# Patient Record
Sex: Female | Born: 1945
Health system: Southern US, Community
[De-identification: ages and names within clinical notes are randomized; demographics above are authoritative.]

## PROBLEM LIST (undated history)

## (undated) DIAGNOSIS — R7303 Prediabetes: Secondary | ICD-10-CM

## (undated) DIAGNOSIS — T8859XA Other complications of anesthesia, initial encounter: Secondary | ICD-10-CM

## (undated) DIAGNOSIS — I10 Essential (primary) hypertension: Secondary | ICD-10-CM

## (undated) DIAGNOSIS — T4145XA Adverse effect of unspecified anesthetic, initial encounter: Secondary | ICD-10-CM

## (undated) DIAGNOSIS — H33339 Multiple defects of retina without detachment, unspecified eye: Secondary | ICD-10-CM

## (undated) DIAGNOSIS — E785 Hyperlipidemia, unspecified: Secondary | ICD-10-CM

## (undated) DIAGNOSIS — H43819 Vitreous degeneration, unspecified eye: Secondary | ICD-10-CM

## (undated) DIAGNOSIS — K219 Gastro-esophageal reflux disease without esophagitis: Secondary | ICD-10-CM

## (undated) DIAGNOSIS — K589 Irritable bowel syndrome without diarrhea: Secondary | ICD-10-CM

## (undated) DIAGNOSIS — I872 Venous insufficiency (chronic) (peripheral): Secondary | ICD-10-CM

## (undated) DIAGNOSIS — H269 Unspecified cataract: Secondary | ICD-10-CM

## (undated) HISTORY — DX: Vitreous degeneration, unspecified eye: H43.819

## (undated) HISTORY — DX: Irritable bowel syndrome, unspecified: K58.9

## (undated) HISTORY — DX: Essential (primary) hypertension: I10

## (undated) HISTORY — DX: Unspecified cataract: H26.9

## (undated) HISTORY — DX: Hyperlipidemia, unspecified: E78.5

## (undated) HISTORY — DX: Multiple defects of retina without detachment, unspecified eye: H33.339

## (undated) HISTORY — DX: Morbid (severe) obesity due to excess calories: E66.01

## (undated) HISTORY — DX: Gastro-esophageal reflux disease without esophagitis: K21.9

## (undated) HISTORY — DX: Venous insufficiency (chronic) (peripheral): I87.2

## (undated) HISTORY — DX: Prediabetes: R73.03

---
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
## 1993-09-12 DIAGNOSIS — I10 Essential (primary) hypertension: Secondary | ICD-10-CM

## 1993-09-12 HISTORY — DX: Essential (primary) hypertension: I10

## 1998-09-12 HISTORY — PX: RETINAL TEAR REPAIR CRYOTHERAPY: SHX5304

## 2012-03-14 ENCOUNTER — Ambulatory Visit (HOSPITAL_COMMUNITY)
Admission: RE | Admit: 2012-03-14 | Discharge: 2012-03-14 | Disposition: A | Discharge status: Home / Self Care | Payer: BC Managed Care – PPO | Source: Ambulatory Visit | Attending: Internal Medicine | Admitting: Internal Medicine

## 2012-03-14 ENCOUNTER — Other Ambulatory Visit (HOSPITAL_COMMUNITY): Payer: Self-pay | Admitting: Internal Medicine

## 2012-03-14 DIAGNOSIS — I10 Essential (primary) hypertension: Secondary | ICD-10-CM

## 2012-03-14 DIAGNOSIS — Z0000 Encounter for general adult medical examination without abnormal findings: Secondary | ICD-10-CM | POA: Insufficient documentation

## 2014-09-12 DIAGNOSIS — R7303 Prediabetes: Secondary | ICD-10-CM

## 2014-09-12 HISTORY — DX: Prediabetes: R73.03

## 2015-04-07 ENCOUNTER — Ambulatory Visit (INDEPENDENT_AMBULATORY_CARE_PROVIDER_SITE_OTHER): Payer: Medicare PPO | Admitting: Internal Medicine

## 2015-04-07 ENCOUNTER — Encounter: Payer: Self-pay | Admitting: Internal Medicine

## 2015-04-07 VITALS — BP 148/82 | HR 68 | Temp 98.2°F | Resp 18 | Ht 67.75 in | Wt 269.0 lb

## 2015-04-07 DIAGNOSIS — Z1329 Encounter for screening for other suspected endocrine disorder: Secondary | ICD-10-CM

## 2015-04-07 DIAGNOSIS — R6889 Other general symptoms and signs: Secondary | ICD-10-CM

## 2015-04-07 DIAGNOSIS — E559 Vitamin D deficiency, unspecified: Secondary | ICD-10-CM

## 2015-04-07 DIAGNOSIS — Z0000 Encounter for general adult medical examination without abnormal findings: Secondary | ICD-10-CM

## 2015-04-07 DIAGNOSIS — Z130 Encounter for screening for diseases of the blood and blood-forming organs and certain disorders involving the immune mechanism: Secondary | ICD-10-CM

## 2015-04-07 DIAGNOSIS — I10 Essential (primary) hypertension: Secondary | ICD-10-CM

## 2015-04-07 DIAGNOSIS — Z79899 Other long term (current) drug therapy: Secondary | ICD-10-CM

## 2015-04-07 DIAGNOSIS — Z13220 Encounter for screening for lipoid disorders: Secondary | ICD-10-CM

## 2015-04-07 DIAGNOSIS — Z131 Encounter for screening for diabetes mellitus: Secondary | ICD-10-CM

## 2015-04-07 DIAGNOSIS — Z9181 History of falling: Secondary | ICD-10-CM

## 2015-04-07 DIAGNOSIS — Z1331 Encounter for screening for depression: Secondary | ICD-10-CM

## 2015-04-07 DIAGNOSIS — Z0001 Encounter for general adult medical examination with abnormal findings: Secondary | ICD-10-CM

## 2015-04-07 DIAGNOSIS — Z1389 Encounter for screening for other disorder: Secondary | ICD-10-CM

## 2015-04-07 DIAGNOSIS — Z136 Encounter for screening for cardiovascular disorders: Secondary | ICD-10-CM

## 2015-04-07 LAB — CBC WITH DIFFERENTIAL/PLATELET
BASOS ABS: 0 10*3/uL (ref 0.0–0.1)
Basophils Relative: 1 % (ref 0–1)
EOS PCT: 3 % (ref 0–5)
Eosinophils Absolute: 0.1 10*3/uL (ref 0.0–0.7)
HEMATOCRIT: 36.3 % (ref 36.0–46.0)
HEMOGLOBIN: 11.7 g/dL — AB (ref 12.0–15.0)
LYMPHS ABS: 1.8 10*3/uL (ref 0.7–4.0)
LYMPHS PCT: 42 % (ref 12–46)
MCH: 29.8 pg (ref 26.0–34.0)
MCHC: 32.2 g/dL (ref 30.0–36.0)
MCV: 92.4 fL (ref 78.0–100.0)
MONO ABS: 0.2 10*3/uL (ref 0.1–1.0)
MONOS PCT: 4 % (ref 3–12)
MPV: 9.7 fL (ref 8.6–12.4)
NEUTROS PCT: 50 % (ref 43–77)
Neutro Abs: 2.2 10*3/uL (ref 1.7–7.7)
Platelets: 263 10*3/uL (ref 150–400)
RBC: 3.93 MIL/uL (ref 3.87–5.11)
RDW: 14.1 % (ref 11.5–15.5)
WBC: 4.4 10*3/uL (ref 4.0–10.5)

## 2015-04-07 LAB — BASIC METABOLIC PANEL WITH GFR
BUN: 12 mg/dL (ref 7–25)
CHLORIDE: 105 meq/L (ref 98–110)
CO2: 27 mEq/L (ref 20–31)
Calcium: 9.1 mg/dL (ref 8.6–10.4)
Creat: 0.99 mg/dL (ref 0.50–0.99)
GFR, EST AFRICAN AMERICAN: 68 mL/min (ref >=60)
GFR, EST NON AFRICAN AMERICAN: 59 mL/min — AB (ref >=60)
GLUCOSE: 89 mg/dL (ref 65–99)
POTASSIUM: 4.2 meq/L (ref 3.5–5.3)
Sodium: 140 mEq/L (ref 135–146)

## 2015-04-07 LAB — IRON AND TIBC
%SAT: 24 % (ref 20–55)
Iron: 77 ug/dL (ref 42–145)
TIBC: 327 ug/dL (ref 250–470)
UIBC: 250 ug/dL (ref 125–400)

## 2015-04-07 LAB — HEMOGLOBIN A1C
Hgb A1c MFr Bld: 6 % — ABNORMAL HIGH (ref <5.7)
Mean Plasma Glucose: 126 mg/dL — ABNORMAL HIGH (ref <117)

## 2015-04-07 LAB — LIPID PANEL
CHOL/HDL RATIO: 5.2 ratio — AB (ref <=5.0)
CHOLESTEROL: 293 mg/dL — AB (ref 125–200)
HDL: 56 mg/dL (ref >=46)
LDL Cholesterol: 212 mg/dL — ABNORMAL HIGH (ref <130)
TRIGLYCERIDES: 124 mg/dL (ref <150)
VLDL: 25 mg/dL (ref <30)

## 2015-04-07 LAB — HEPATIC FUNCTION PANEL
ALK PHOS: 74 U/L (ref 33–130)
ALT: 8 U/L (ref 6–29)
AST: 16 U/L (ref 10–35)
Albumin: 3.9 g/dL (ref 3.6–5.1)
BILIRUBIN DIRECT: 0.1 mg/dL (ref <=0.2)
Indirect Bilirubin: 0.3 mg/dL (ref 0.2–1.2)
Total Bilirubin: 0.4 mg/dL (ref 0.2–1.2)
Total Protein: 7.5 g/dL (ref 6.1–8.1)

## 2015-04-07 LAB — VITAMIN B12: Vitamin B-12: 871 pg/mL (ref 211–911)

## 2015-04-07 LAB — TSH: TSH: 1.666 u[IU]/mL (ref 0.350–4.500)

## 2015-04-07 LAB — MAGNESIUM: MAGNESIUM: 2.3 mg/dL (ref 1.5–2.5)

## 2015-04-07 MED ORDER — AZITHROMYCIN 250 MG PO TABS: ORAL_TABLET | ORAL | Status: DC

## 2015-04-07 MED ORDER — CIPROFLOXACIN HCL 500 MG PO TABS: 500.0000 mg | ORAL_TABLET | Freq: Two times a day (BID) | ORAL | Status: AC

## 2015-04-07 MED ORDER — MONTELUKAST SODIUM 10 MG PO TABS: 10.0000 mg | ORAL_TABLET | Freq: Every day | ORAL | Status: DC

## 2015-04-07 MED ORDER — PREDNISONE 20 MG PO TABS: ORAL_TABLET | ORAL | Status: DC

## 2015-04-07 MED ORDER — MECLIZINE HCL 25 MG PO TABS: 25.0000 mg | ORAL_TABLET | Freq: Three times a day (TID) | ORAL | Status: DC | PRN

## 2015-04-07 NOTE — Patient Instructions (Addendum)
Preventive Care for Adults  A healthy lifestyle and preventive care can promote health and wellness. Preventive health guidelines for women include the following key practices.  A routine yearly physical is a good way to check with your health care provider about your health and preventive screening. It is a chance to share any concerns and updates on your health and to receive a thorough exam.  Visit your dentist for a routine exam and preventive care every 6 months. Brush your teeth twice a day and floss once a day. Good oral hygiene prevents tooth decay and gum disease.  The frequency of eye exams is based on your age, health, family medical history, use of contact lenses, and other factors. Follow your health care provider's recommendations for frequency of eye exams.  Eat a healthy diet. Foods like vegetables, fruits, whole grains, low-fat dairy products, and lean protein foods contain the nutrients you need without too many calories. Decrease your intake of foods high in solid fats, added sugars, and salt. Eat the right amount of calories for you.Get information about a proper diet from your health care provider, if necessary.  Regular physical exercise is one of the most important things you can do for your health. Most adults should get at least 150 minutes of moderate-intensity exercise (any activity that increases your heart rate and causes you to sweat) each week. In addition, most adults need muscle-strengthening exercises on 2 or more days a week.  Maintain a healthy weight. The body mass index (BMI) is a screening tool to identify possible weight problems. It provides an estimate of body fat based on height and weight. Your health care provider can find your BMI and can help you achieve or maintain a healthy weight.For adults 20 years and older:  A BMI below 18.5 is considered underweight.  A BMI of 18.5 to 24.9 is normal.  A BMI of 25 to 29.9 is considered overweight.  A BMI  of 30 and above is considered obese.  Maintain normal blood lipids and cholesterol levels by exercising and minimizing your intake of saturated fat. Eat a balanced diet with plenty of fruit and vegetables. If your lipid or cholesterol levels are high, you are over 50, or you are at high risk for heart disease, you may need your cholesterol levels checked more frequently.Ongoing high lipid and cholesterol levels should be treated with medicines if diet and exercise are not working.  If you smoke, find out from your health care provider how to quit. If you do not use tobacco, do not start.  Lung cancer screening is recommended for adults aged 2-80 years who are at high risk for developing lung cancer because of a history of smoking. A yearly low-dose CT scan of the lungs is recommended for people who have at least a 30-pack-year history of smoking and are a current smoker or have quit within the past 15 years. A pack year of smoking is smoking an average of 1 pack of cigarettes a day for 1 year (for example: 1 pack a day for 30 years or 2 packs a day for 15 years). Yearly screening should continue until the smoker has stopped smoking for at least 15 years. Yearly screening should be stopped for people who develop a health problem that would prevent them from having lung cancer treatment.  Avoid use of street drugs. Do not share needles with anyone. Ask for help if you need support or instructions about stopping the use of drugs.  High  blood pressure causes heart disease and increases the risk of stroke.  Ongoing high blood pressure should be treated with medicines if weight loss and exercise do not work.  If you are 55-79 years old, ask your health care provider if you should take aspirin to prevent strokes.  Diabetes screening involves taking a blood sample to check your fasting blood sugar level. This should be done once every 3 years, after age 45, if you are within normal weight and without risk  factors for diabetes. Testing should be considered at a younger age or be carried out more frequently if you are overweight and have at least 1 risk factor for diabetes.  Breast cancer screening is essential preventive care for women. You should practice "breast self-awareness." This means understanding the normal appearance and feel of your breasts and may include breast self-examination. Any changes detected, no matter how small, should be reported to a health care provider. Women in their 20s and 30s should have a clinical breast exam (CBE) by a health care provider as part of a regular health exam every 1 to 3 years. After age 40, women should have a CBE every year. Starting at age 40, women should consider having a mammogram (breast X-ray test) every year. Women who have a family history of breast cancer should talk to their health care provider about genetic screening. Women at a high risk of breast cancer should talk to their health care providers about having an MRI and a mammogram every year.  Breast cancer gene (BRCA)-related cancer risk assessment is recommended for women who have family members with BRCA-related cancers. BRCA-related cancers include breast, ovarian, tubal, and peritoneal cancers. Having family members with these cancers may be associated with an increased risk for harmful changes (mutations) in the breast cancer genes BRCA1 and BRCA2. Results of the assessment will determine the need for genetic counseling and BRCA1 and BRCA2 testing.  Routine pelvic exams to screen for cancer are no longer recommended for nonpregnant women who are considered low risk for cancer of the pelvic organs (ovaries, uterus, and vagina) and who do not have symptoms. Ask your health care provider if a screening pelvic exam is right for you.  If you have had past treatment for cervical cancer or a condition that could lead to cancer, you need Pap tests and screening for cancer for at least 20 years after  your treatment. If Pap tests have been discontinued, your risk factors (such as having a new sexual partner) need to be reassessed to determine if screening should be resumed. Some women have medical problems that increase the chance of getting cervical cancer. In these cases, your health care provider may recommend more frequent screening and Pap tests.    Colorectal cancer can be detected and often prevented. Most routine colorectal cancer screening begins at the age of 50 years and continues through age 75 years. However, your health care provider may recommend screening at an earlier age if you have risk factors for colon cancer. On a yearly basis, your health care provider may provide home test kits to check for hidden blood in the stool. Use of a small camera at the end of a tube, to directly examine the colon (sigmoidoscopy or colonoscopy), can detect the earliest forms of colorectal cancer. Talk to your health care provider about this at age 50, when routine screening begins. Direct exam of the colon should be repeated every 5-10 years through age 75 years, unless early forms of pre-cancerous   polyps or small growths are found.  Osteoporosis is a disease in which the bones lose minerals and strength with aging. This can result in serious bone fractures or breaks. The risk of osteoporosis can be identified using a bone density scan. Women ages 68 years and over and women at risk for fractures or osteoporosis should discuss screening with their health care providers. Ask your health care provider whether you should take a calcium supplement or vitamin D to reduce the rate of osteoporosis.  Menopause can be associated with physical symptoms and risks. Hormone replacement therapy is available to decrease symptoms and risks. You should talk to your health care provider about whether hormone replacement therapy is right for you.  Use sunscreen. Apply sunscreen liberally and repeatedly throughout the day.  You should seek shade when your shadow is shorter than you. Protect yourself by wearing long sleeves, pants, a wide-brimmed hat, and sunglasses year round, whenever you are outdoors.  Once a month, do a whole body skin exam, using a mirror to look at the skin on your back. Tell your health care provider of new moles, moles that have irregular borders, moles that are larger than a pencil eraser, or moles that have changed in shape or color.  Stay current with required vaccines (immunizations).  Influenza vaccine. All adults should be immunized every year.  Tetanus, diphtheria, and acellular pertussis (Td, Tdap) vaccine. Pregnant women should receive 1 dose of Tdap vaccine during each pregnancy. The dose should be obtained regardless of the length of time since the last dose. Immunization is preferred during the 27th-36th week of gestation. An adult who has not previously received Tdap or who does not know her vaccine status should receive 1 dose of Tdap. This initial dose should be followed by tetanus and diphtheria toxoids (Td) booster doses every 10 years. Adults with an unknown or incomplete history of completing a 3-dose immunization series with Td-containing vaccines should begin or complete a primary immunization series including a Tdap dose. Adults should receive a Td booster every 10 years.    Zoster vaccine. One dose is recommended for adults aged 36 years or older unless certain conditions are present.    Pneumococcal 13-valent conjugate (PCV13) vaccine. When indicated, a person who is uncertain of her immunization history and has no record of immunization should receive the PCV13 vaccine. An adult aged 1 years or older who has certain medical conditions and has not been previously immunized should receive 1 dose of PCV13 vaccine. This PCV13 should be followed with a dose of pneumococcal polysaccharide (PPSV23) vaccine. The PPSV23 vaccine dose should be obtained at least 8 weeks after the  dose of PCV13 vaccine. An adult aged 73 years or older who has certain medical conditions and previously received 1 or more doses of PPSV23 vaccine should receive 1 dose of PCV13. The PCV13 vaccine dose should be obtained 1 or more years after the last PPSV23 vaccine dose.    Pneumococcal polysaccharide (PPSV23) vaccine. When PCV13 is also indicated, PCV13 should be obtained first. All adults aged 47 years and older should be immunized. An adult younger than age 86 years who has certain medical conditions should be immunized. Any person who resides in a nursing home or long-term care facility should be immunized. An adult smoker should be immunized. People with an immunocompromised condition and certain other conditions should receive both PCV13 and PPSV23 vaccines. People with human immunodeficiency virus (HIV) infection should be immunized as soon as possible after diagnosis. Immunization  during chemotherapy or radiation therapy should be avoided. Routine use of PPSV23 vaccine is not recommended for American Indians, [place] Natives, or people younger than 65 years unless there are medical conditions that require PPSV23 vaccine. When indicated, people who have unknown immunization and have no record of immunization should receive PPSV23 vaccine. One-time revaccination 5 years after the first dose of PPSV23 is recommended for people aged 19-64 years who have chronic kidney failure, nephrotic syndrome, asplenia, or immunocompromised conditions. People who received 1-2 doses of PPSV23 before age 4 years should receive another dose of PPSV23 vaccine at age 77 years or later if at least 5 years have passed since the previous dose. Doses of PPSV23 are not needed for people immunized with PPSV23 at or after age 69 years.   Preventive Services / Frequency  Ages 56 years and over  Blood pressure check.  Lipid and cholesterol check.  Lung cancer screening. / Every year if you are aged 23-80 years and have a  30-pack-year history of smoking and currently smoke or have quit within the past 15 years. Yearly screening is stopped once you have quit smoking for at least 15 years or develop a health problem that would prevent you from having lung cancer treatment.  Clinical breast exam.** / Every year after age 1 years.  BRCA-related cancer risk assessment.** / For women who have family members with a BRCA-related cancer (breast, ovarian, tubal, or peritoneal cancers).  Mammogram.** / Every year beginning at age 51 years and continuing for as long as you are in good health. Consult with your health care provider.  Pap test.** / Every 3 years starting at age 23 years through age 49 or 45 years with 3 consecutive normal Pap tests. Testing can be stopped between 65 and 70 years with 3 consecutive normal Pap tests and no abnormal Pap or HPV tests in the past 10 years.  Fecal occult blood test (FOBT) of stool. / Every year beginning at age 33 years and continuing until age 63 years. You may not need to do this test if you get a colonoscopy every 10 years.  Flexible sigmoidoscopy or colonoscopy.** / Every 5 years for a flexible sigmoidoscopy or every 10 years for a colonoscopy beginning at age 60 years and continuing until age 62 years.  Hepatitis C blood test.** / For all people born from [birth year] through 1963 and any individual with known risks for hepatitis C.  Osteoporosis screening.** / A one-time screening for women ages 38 years and over and women at risk for fractures or osteoporosis.  Skin self-exam. / Monthly.  Influenza vaccine. / Every year.  Tetanus, diphtheria, and acellular pertussis (Tdap/Td) vaccine.** / 1 dose of Td every 10 years.  Zoster vaccine.** / 1 dose for adults aged 57 years or older.  Pneumococcal 13-valent conjugate (PCV13) vaccine.** / Consult your health care provider.  Pneumococcal polysaccharide (PPSV23) vaccine.** / 1 dose for all adults aged 61 years and older. Screening  for abdominal aortic aneurysm (AAA)  by ultrasound is recommended for people who have history of high blood pressure or who are current or former smokers.   If you are traveling you can take these medications to be more prepared. If you get chest pain, shortness of breath or abdominal pain please go to the hospital wherever you may be.   Ciprofloxacin is good for travelers diarrhea, you can take 2 pills a day for 7 days. Or it is also good for urinary tract infections, you can take  2 a day for 7 days.  Zpak- is good for sinus infections please finish as prescribed Prednisone is good for joint pain or rashes or spider bites- you can take it as prescribed but if you are feeling better you can stop it early.  Meclizine is good for seasickness and dizziness.  It may make you drowsy.  Do not drink alcohol with this.

## 2015-04-07 NOTE — Progress Notes (Signed)
MEDICARE ANNUAL WELLNESS VISIT AND CPE  Assessment:     1. Routine general medical examination at a health care facility  - CBC with Differential/Platelet - BASIC METABOLIC PANEL WITH GFR - Hepatic function panel - Magnesium - Lipid panel - Hemoglobin A1c - Insulin, random - Iron and TIBC - Vitamin B12 - Urinalysis, Routine w reflex microscopic (not at [facility]) - Microalbumin / creatinine urine ratio - EKG 12-Lead - [place], RETROPERITNL ABD,  LTD - Vit D  25 hydroxy (rtn osteoporosis monitoring) - TSH  2. Screening for diabetes mellitus  - Hemoglobin A1c - Insulin, random  3. Screening for hyperlipidemia  - Lipid panel  4. Screening for thyroid disorder  - TSH  5. Screening for hematuria or proteinuria  - Urinalysis, Routine w reflex microscopic (not at [facility]) - Microalbumin / creatinine urine ratio  6. Medication management  - CBC with Differential/Platelet - BASIC METABOLIC PANEL WITH GFR - Hepatic function panel - Magnesium  7. Screening for deficiency anemia  - Iron and TIBC - Vitamin B12  8. Screening for cardiovascular condition  - EKG 12-Lead - [place], RETROPERITNL ABD,  LTD  9. Vitamin D deficiency -cont supplement - Vit D  25 hydroxy (rtn osteoporosis monitoring)     Over 40 minutes of exam, counseling, chart review and critical decision making was performed  Plan:   During the course of the visit the patient was educated and counseled about appropriate screening and preventive services including:    Pneumococcal vaccine   Prevnar 13  Influenza vaccine  Td vaccine  Screening electrocardiogram  Bone densitometry screening  Colorectal cancer screening  Diabetes screening  Glaucoma screening  Nutrition counseling   Advanced directives: requested  Conditions/risks identified: BMI: Discussed weight loss, diet, and increase physical activity.  Increase physical activity: AHA recommends 150 minutes of physical activity a week.   Medications reviewed Urinary Incontinence is not an issue: discussed non pharmacology and pharmacology options.  Fall risk: low- discussed PT, home fall assessment, medications.    Subjective:  Susan Fernandez is a 69 y.o. female who presents for Medicare Annual Wellness Visit and complete physical.  Date of last medicare wellness visit is unknown.   This very nice 69 y.o.female presents for complete physical.  Patient has no major health issues.  Patient reports no complaints at this time.  Patient is here to reestablish as a patient.  She reports that she has been traveling and has also had to have some extensive dental work done.    She reports that she had an issue with syncope a couple weeks ago.  She reports that she was working outside and she did not drink any thing.  She reports that she passed out and fell face first in the yard.  She had some dizziness and also some blackness around her vision before she passed out.  She reports that she hasn't had any issues since then.    Finally, patient has history of Vitamin D Deficiency and last vitamin D was No results found for: VD25OH.  Currently on supplementation.  Patient reports that she does have a problem with seasonal allergies.  She does not take any thing on a regular basis.  She reports that she only has issues if she is getting on airplanes.    She reports that she is going to be on a cruise coming up and she would like to have some dramamine and and would like to have some other travel medications.  Medication Review: No current outpatient prescriptions on file prior to visit.   No current facility-administered medications on file prior to visit.    Current Problems (verified) There are no active problems to display for this patient.   Screening Tests  There is no immunization history on file for this patient.  Preventative care: Last colonoscopy: Never had one and patient refuses, cologuard information  given Last mammogram: 2011 Last pap smear/pelvic exam:   DEXA: 2011 No results found for this or any previous visit.  Prior vaccinations: TD or Tdap: 03/2012  Influenza: Declines  Pneumococcal: Declines Prevnar13: Declines Shingles/Zostavax: 05/2011  Names of Other Physician/Practitioners you currently use: 1. [facility] here for primary care 2. Not seeing one currently, eye doctor, last visit  3. Dr. , dentist, last visit No care team member to display  SURGICAL HISTORY No past surgical history on file. FAMILY HISTORY No family history on file. SOCIAL HISTORY History  Substance Use Topics  . Smoking status: Never Smoker   . Smokeless tobacco: Not on file  . Alcohol Use: Not on file    MEDICARE WELLNESS OBJECTIVES: Tobacco use: She does not smoke.  Patient is not a former smoker. If yes, counseling given Alcohol Current alcohol use: none Osteoporosis: postmenopausal estrogen deficiency and amenorrhea, History of fracture in the past year: no Fall risk: Low Risk Hearing: normal Visual acuity: normal,  does not perform annual eye exam Diet: in general, an "unhealthy" diet Physical activity: Type of exercise: walking, Intensity: Moderate Cardiac risk factors: Cardiac Risk Factors include: advanced age (>67men, >49 women);obesity (BMI >30kg/m2);sedentary lifestyle Depression/mood screen:   Depression screen [facility] 2/9 04/07/2015  Decreased Interest 0  Down, Depressed, Hopeless 0  PHQ - 2 Score 0    ADLs:  In your present state of health, do you have any difficulty performing the following activities: 04/07/2015  Hearing? N  Vision? N  Difficulty concentrating or making decisions? N  Walking or climbing stairs? N  Dressing or bathing? N  Doing errands, shopping? N  Preparing Food and eating ? N  Using the Toilet? N  In the past six months, have you accidently leaked urine? N  Do you have problems with loss of bowel control? N   Managing your Medications? N  Managing your Finances? N  Housekeeping or managing your Housekeeping? N     Cognitive Testing  Alert? Yes  Normal Appearance?Yes  Oriented to person? Yes  Place? Yes   Time? Yes  Recall of three objects?  Yes  Can perform simple calculations? Yes  Displays appropriate judgment?Yes  Can read the correct time from a watch face?Yes  EOL planning: Does patient have an advance directive?: No Would patient like information on creating an advanced directive?: Yes - Educational materials given  Review of Systems  Constitutional: Negative for fever, chills and malaise/fatigue.  HENT: Negative for congestion, ear pain and sore throat.   Eyes: Negative.   Respiratory: Negative for cough, shortness of breath and wheezing.   Cardiovascular: Negative for chest pain, palpitations and leg swelling.  Gastrointestinal: Negative for heartburn, diarrhea, constipation, blood in stool and melena.  Genitourinary: Negative.  Negative for dysuria, urgency, frequency and hematuria.  Skin: Negative.   Neurological: Positive for dizziness (occasionally like 2 weeks ago). Negative for sensory change, loss of consciousness and headaches.  Psychiatric/Behavioral: Negative for depression. The patient is not nervous/anxious and does not have insomnia.      Objective:     Today's Vitals  04/07/15 0849  BP: 148/82  Pulse: 68  Temp: 98.2 F (36.8 C)  TempSrc: Temporal  Resp: 18  Height: 5' 7.75" (1.721 m)  Weight: 269 lb (122.018 kg)   Body mass index is 41.2 kg/(m^2).  General appearance: alert, no distress, WD/WN, female HEENT: normocephalic, sclerae anicteric, TMs pearly, nares patent, no discharge or erythema, pharynx normal Oral cavity: MMM, no lesions Neck: supple, no lymphadenopathy, no thyromegaly, no masses Heart: RRR, normal S1, S2, no murmurs Lungs: CTA bilaterally, no wheezes, rhonchi, or rales Abdomen: +bs, soft, non tender, non distended, no masses, no  hepatomegaly, no splenomegaly Musculoskeletal: nontender, no swelling, no obvious deformity Extremities: no edema, no cyanosis, no clubbing Pulses: 2+ symmetric, upper and lower extremities, normal cap refill Neurological: alert, oriented x 3, CN2-12 intact, strength normal upper extremities and lower extremities, sensation normal throughout, DTRs 2+ throughout, no cerebellar signs, gait normal Psychiatric: normal affect, behavior normal, pleasant   Medicare Attestation I have personally reviewed: The patient's medical and social history Their use of alcohol, tobacco or illicit drugs Their current medications and supplements The patient's functional ability including ADLs,fall risks, home safety risks, cognitive, and hearing and visual impairment Diet and physical activities Evidence for depression or mood disorders  The patient's weight, height, BMI, and visual acuity have been recorded in the chart.  I have made referrals, counseling, and provided education to the patient based on review of the above and I have provided the patient with a written personalized care plan for preventive services.     Terri Piedra, PA-C   04/07/2015

## 2015-04-08 LAB — URINALYSIS, ROUTINE W REFLEX MICROSCOPIC
BILIRUBIN URINE: NEGATIVE
Glucose, UA: NEGATIVE
Hgb urine dipstick: NEGATIVE
KETONES UR: NEGATIVE
Leukocytes, UA: NEGATIVE
NITRITE: NEGATIVE
PROTEIN: NEGATIVE
Specific Gravity, Urine: 1.023 (ref 1.001–1.035)
pH: 6 (ref 5.0–8.0)

## 2015-04-08 LAB — MICROALBUMIN / CREATININE URINE RATIO
Creatinine, Urine: 217.9 mg/dL
MICROALB UR: 1 mg/dL (ref <2.0)
Microalb Creat Ratio: 4.6 mg/g (ref 0.0–30.0)

## 2015-04-08 LAB — INSULIN, RANDOM: Insulin: 9 u[IU]/mL (ref 2.0–19.6)

## 2015-04-08 LAB — VITAMIN D 25 HYDROXY (VIT D DEFICIENCY, FRACTURES): Vit D, 25-Hydroxy: 36 ng/mL (ref 30–100)

## 2015-07-14 ENCOUNTER — Ambulatory Visit: Payer: Self-pay | Admitting: Internal Medicine

## 2015-07-30 ENCOUNTER — Ambulatory Visit: Payer: Self-pay | Admitting: Internal Medicine

## 2015-09-07 ENCOUNTER — Encounter: Payer: Self-pay | Admitting: *Deleted

## 2015-10-29 DIAGNOSIS — H33339 Multiple defects of retina without detachment, unspecified eye: Secondary | ICD-10-CM

## 2015-10-29 DIAGNOSIS — H25819 Combined forms of age-related cataract, unspecified eye: Secondary | ICD-10-CM | POA: Insufficient documentation

## 2015-10-29 DIAGNOSIS — H43819 Vitreous degeneration, unspecified eye: Secondary | ICD-10-CM

## 2015-10-29 HISTORY — DX: Multiple defects of retina without detachment, unspecified eye: H33.339

## 2015-10-29 HISTORY — DX: Vitreous degeneration, unspecified eye: H43.819

## 2015-11-03 ENCOUNTER — Encounter: Payer: Self-pay | Admitting: Internal Medicine

## 2015-11-03 ENCOUNTER — Ambulatory Visit (INDEPENDENT_AMBULATORY_CARE_PROVIDER_SITE_OTHER): Payer: Medicare Other | Admitting: Internal Medicine

## 2015-11-03 VITALS — BP 146/82 | HR 76 | Temp 97.4°F | Resp 16 | Ht 67.75 in | Wt 265.2 lb

## 2015-11-03 DIAGNOSIS — E785 Hyperlipidemia, unspecified: Secondary | ICD-10-CM

## 2015-11-03 DIAGNOSIS — I10 Essential (primary) hypertension: Secondary | ICD-10-CM | POA: Diagnosis not present

## 2015-11-03 DIAGNOSIS — R7303 Prediabetes: Secondary | ICD-10-CM

## 2015-11-03 DIAGNOSIS — Z79899 Other long term (current) drug therapy: Secondary | ICD-10-CM | POA: Insufficient documentation

## 2015-11-03 DIAGNOSIS — E559 Vitamin D deficiency, unspecified: Secondary | ICD-10-CM

## 2015-11-03 LAB — HEMOGLOBIN A1C
HEMOGLOBIN A1C: 5.9 % — AB (ref <5.7)
MEAN PLASMA GLUCOSE: 123 mg/dL — AB (ref <117)

## 2015-11-03 LAB — HEPATIC FUNCTION PANEL
ALT: 10 U/L (ref 6–29)
AST: 18 U/L (ref 10–35)
Albumin: 3.9 g/dL (ref 3.6–5.1)
Alkaline Phosphatase: 80 U/L (ref 33–130)
BILIRUBIN INDIRECT: 0.3 mg/dL (ref 0.2–1.2)
Bilirubin, Direct: 0.1 mg/dL (ref <=0.2)
TOTAL PROTEIN: 7.9 g/dL (ref 6.1–8.1)
Total Bilirubin: 0.4 mg/dL (ref 0.2–1.2)

## 2015-11-03 LAB — MAGNESIUM: MAGNESIUM: 2.1 mg/dL (ref 1.5–2.5)

## 2015-11-03 LAB — CBC WITH DIFFERENTIAL/PLATELET
Basophils Absolute: 0 10*3/uL (ref 0.0–0.1)
Basophils Relative: 0 % (ref 0–1)
Eosinophils Absolute: 0.1 10*3/uL (ref 0.0–0.7)
Eosinophils Relative: 2 % (ref 0–5)
HEMATOCRIT: 37.9 % (ref 36.0–46.0)
HEMOGLOBIN: 12.5 g/dL (ref 12.0–15.0)
LYMPHS ABS: 1.8 10*3/uL (ref 0.7–4.0)
LYMPHS PCT: 34 % (ref 12–46)
MCH: 30.3 pg (ref 26.0–34.0)
MCHC: 33 g/dL (ref 30.0–36.0)
MCV: 91.8 fL (ref 78.0–100.0)
MONO ABS: 0.3 10*3/uL (ref 0.1–1.0)
MPV: 9.8 fL (ref 8.6–12.4)
Monocytes Relative: 6 % (ref 3–12)
Neutro Abs: 3 10*3/uL (ref 1.7–7.7)
Neutrophils Relative %: 58 % (ref 43–77)
Platelets: 250 10*3/uL (ref 150–400)
RBC: 4.13 MIL/uL (ref 3.87–5.11)
RDW: 13.7 % (ref 11.5–15.5)
WBC: 5.2 10*3/uL (ref 4.0–10.5)

## 2015-11-03 LAB — LIPID PANEL
Cholesterol: 318 mg/dL — ABNORMAL HIGH (ref 125–200)
HDL: 56 mg/dL (ref >=46)
LDL Cholesterol: 241 mg/dL — ABNORMAL HIGH (ref <130)
Total CHOL/HDL Ratio: 5.7 Ratio — ABNORMAL HIGH (ref <=5.0)
Triglycerides: 106 mg/dL (ref <150)
VLDL: 21 mg/dL (ref <30)

## 2015-11-03 LAB — BASIC METABOLIC PANEL WITH GFR
BUN: 17 mg/dL (ref 7–25)
CO2: 28 mmol/L (ref 20–31)
CREATININE: 0.98 mg/dL (ref 0.50–0.99)
Calcium: 9.3 mg/dL (ref 8.6–10.4)
Chloride: 100 mmol/L (ref 98–110)
GFR, Est African American: 69 mL/min (ref >=60)
GFR, Est Non African American: 60 mL/min (ref >=60)
GLUCOSE: 84 mg/dL (ref 65–99)
Potassium: 4.6 mmol/L (ref 3.5–5.3)
Sodium: 136 mmol/L (ref 135–146)

## 2015-11-03 LAB — TSH: TSH: 1.63 m[IU]/L

## 2015-11-03 MED ORDER — BISOPROLOL-HYDROCHLOROTHIAZIDE 5-6.25 MG PO TABS: ORAL_TABLET | ORAL | Status: DC

## 2015-11-03 NOTE — Progress Notes (Signed)
Patient ID: Susan Fernandez, female   DOB: Jul 28, 1946, 70 y.o.   MRN: [id number]   This very nice 70 y.o.DBF presents for  follow up with Hypertension, Hyperlipidemia, Pre-Diabetes and Vitamin D Deficiency. Patient relates she had recent eye exam and was advised that her BP199/84 1 week ago.    Patient is treated for HTN & BP has been controlled at home. Today's BP was 146/82 and rechecked at 160/96. Patient has had no complaints of any cardiac type chest pain, palpitations, dyspnea/orthopnea/PND, dizziness, claudication, or dependent edema.   Hyperlipidemia is controlled with diet & meds. Patient denies myalgias or other med SE's.Current Lipids are not at goal with Cholesterol 318*; HDL 56; LDL 241*; Triglycerides 106.   Also, the patient has history of PreDiabetes and has had no symptoms of reactive hypoglycemia, diabetic polys, paresthesias or visual blurring.  Last A1c was 04/07/2015: Hgb A1c MFr Bld 6.0% on     Further, the patient also has history of Vitamin D Deficiency and supplements vitamin D without any suspected side-effects. Last vitamin D was not at goal with level of 36 on 04/07/2015.  Medication Sig  . aspirin 81 MG Take 81 mg by mouth daily.  . COD LIVER OIL PO Take by mouth daily.  . Vitamin D 1000 UNITS  Take 2,000 Units by mouth daily.  . meclizine  25 MG  Take 1 tablet (25 mg total) by mouth 3 (three) times daily as needed for dizziness or nausea.  . montelukast  10 MG  Take 1 tablet (10 mg total) by mouth daily.   Allergies  Allergen Reactions  . Ppd [Tuberculin Purified Protein Derivative]   . Sulfa Antibiotics   . Tolectin [Tolmetin]    PMHx:  HLD, Obesity, GERD, Venous Insufficiency, HLD, IBS  There is no immunization history on file for this patient. No past surgical history on file. FHx:    Reviewed / unchanged  SHx:    Reviewed / unchanged  Systems Review:  Constitutional: Denies fever, chills, wt changes, headaches, insomnia, fatigue, night sweats,  change in appetite. Eyes: Denies redness, blurred vision, diplopia, discharge, itchy, watery eyes.  ENT: Denies discharge, congestion, post nasal drip, epistaxis, sore throat, earache, hearing loss, dental pain, tinnitus, vertigo, sinus pain, snoring.  CV: Denies chest pain, palpitations, irregular heartbeat, syncope, dyspnea, diaphoresis, orthopnea, PND, claudication or edema. Respiratory: denies cough, dyspnea, DOE, pleurisy, hoarseness, laryngitis, wheezing.  Gastrointestinal: Denies dysphagia, odynophagia, heartburn, reflux, water brash, abdominal pain or cramps, nausea, vomiting, bloating, diarrhea, constipation, hematemesis, melena, hematochezia  or hemorrhoids. Genitourinary: Denies dysuria, frequency, urgency, nocturia, hesitancy, discharge, hematuria or flank pain. Musculoskeletal: Denies arthralgias, myalgias, stiffness, jt. swelling, pain, limping or strain/sprain.  Skin: Denies pruritus, rash, hives, warts, acne, eczema or change in skin lesion(s). Neuro: No weakness, tremor, incoordination, spasms, paresthesia or pain. Psychiatric: Denies confusion, memory loss or sensory loss. Endo: Denies change in weight, skin or hair change.  Heme/Lymph: No excessive bleeding, bruising or enlarged lymph nodes.  Physical Exam  BP 146/82 re ck'd at 160/96 Rt wrist.  Pulse 76  Temp 97.4 F   Resp 16  Ht 5' 7.75"   Wt 265 lb 3.2 oz      BMI 40.61   Appears over nourished and in no distress.  Eyes: PERRLA, EOMs, conjunctiva no swelling or erythema. Sinuses: No frontal/maxillary tenderness ENT/Mouth: EAC's clear, TM's nl w/o erythema, bulging. Nares clear w/o erythema, swelling, exudates. Oropharynx clear without erythema or exudates. Oral hygiene is good. Tongue normal, non obstructing. Hearing  intact.  Neck: Supple. Thyroid nl. Car 2+/2+ without bruits, nodes or JVD. Chest: Respirations nl with BS clear & equal w/o rales, rhonchi, wheezing or stridor.  Cor: Heart sounds normal w/ regular  rate and rhythm without sig. murmurs, gallops, clicks, or rubs. Peripheral pulses normal and equal  without edema.  Abdomen: Soft & bowel sounds normal. Non-tender w/o guarding, rebound, hernias, masses, or organomegaly.  Lymphatics: Unremarkable.  Musculoskeletal: Full ROM all peripheral extremities, joint stability, 5/5 strength, and normal gait.  Skin: Warm, dry without exposed rashes, lesions or ecchymosis apparent.  Neuro: Cranial nerves intact, reflexes equal bilaterally. Sensory-motor testing grossly intact. Tendon reflexes grossly intact.  Pysch: Alert & oriented x 3.  Insight and judgement nl & appropriate. No ideations.  Assessment and Plan:  1. Essential hypertension  - Rx Ziac 5 #90 x1 rf  - TSH  2. Hyperlipidemia  - Lipid panel - TSH  3. Prediabetes  - Hemoglobin A1c - Insulin, random  4. Vitamin D deficiency  - VITAMIN D 25 Hydroxy   5. Medication management  - CBC with Differential/Platelet - BASIC METABOLIC PANEL WITH GFR - Hepatic function panel - Magnesium   Recommended regular exercise, BP monitoring, weight control, and discussed med and SE's. Recommended labs to assess and monitor clinical status. Further disposition pending results of labs. Over 30 minutes of exam, counseling, chart review was performed

## 2015-11-03 NOTE — Patient Instructions (Signed)

## 2015-11-04 ENCOUNTER — Other Ambulatory Visit: Payer: Self-pay | Admitting: Internal Medicine

## 2015-11-04 DIAGNOSIS — E785 Hyperlipidemia, unspecified: Secondary | ICD-10-CM

## 2015-11-04 LAB — INSULIN, RANDOM: Insulin: 10.7 u[IU]/mL (ref 2.0–19.6)

## 2015-11-04 LAB — VITAMIN D 25 HYDROXY (VIT D DEFICIENCY, FRACTURES): Vit D, 25-Hydroxy: 39 ng/mL (ref 30–100)

## 2015-11-04 MED ORDER — ATORVASTATIN CALCIUM 80 MG PO TABS: ORAL_TABLET | ORAL | Status: DC

## 2015-11-24 ENCOUNTER — Ambulatory Visit: Payer: Self-pay | Admitting: Internal Medicine

## 2015-12-04 ENCOUNTER — Ambulatory Visit (INDEPENDENT_AMBULATORY_CARE_PROVIDER_SITE_OTHER): Payer: Medicare Other | Admitting: Internal Medicine

## 2015-12-04 ENCOUNTER — Encounter: Payer: Self-pay | Admitting: Internal Medicine

## 2015-12-04 VITALS — BP 132/64 | HR 60 | Temp 98.0°F | Resp 18 | Ht 67.75 in | Wt 264.0 lb

## 2015-12-04 DIAGNOSIS — I10 Essential (primary) hypertension: Secondary | ICD-10-CM | POA: Diagnosis not present

## 2015-12-04 NOTE — Progress Notes (Signed)
Assessment and Plan:   1. Essential hypertension -well controlled currently. -cont ziac 5/6.25 mg -follow-up at next visit prn  HPI 70 y.o.female presents for 1 month follow up of HTN.  She reports that she had a very elevated reading while she was at the eye doctor.  She reports that Dr. Oneta RackMcKeown put her back on ziac 5 mg/6.25 mg. Patient reports that they have been doing well.  female is taking their medication.  They are not having difficulty with their medications.  They report no adverse reactions.  She reports that she is due to go out of the country soon.  She has not had issues with blood pressure since she started the medication.    Past Medical History  Diagnosis Date  . Prediabetes 2016  . Hypertension 1995  . Hyperlipidemia   . GERD (gastroesophageal reflux disease)   . IBS (irritable bowel syndrome)   . Cataract   . Morbid obesity (HCC)   . Venous insufficiency      Allergies  Allergen Reactions  . Ppd [Tuberculin Purified Protein Derivative]   . Sulfa Antibiotics   . Tolectin [Tolmetin]       Current Outpatient Prescriptions on File Prior to Visit  Medication Sig Dispense Refill  . aspirin 81 MG tablet Take 81 mg by mouth daily.    Marland Kitchen. atorvastatin (LIPITOR) 80 MG tablet Take 1/2 to 1 tablet daily l as directed for Cholesterol 30 tablet 5  . bisoprolol-hydrochlorothiazide (ZIAC) 5-6.25 MG tablet Take 1 tablet daily for BP 90 tablet 1  . COD LIVER OIL PO Take by mouth daily.    . Vitamin D, Cholecalciferol, 1000 UNITS CAPS Take 2,000 Units by mouth daily.     No current facility-administered medications on file prior to visit.    ROS: all negative except above.   Physical Exam: Filed Weights   12/04/15 0927  Weight: 264 lb (119.75 kg)   BP 132/64 mmHg  Pulse 60  Temp(Src) 98 F (36.7 C) (Temporal)  Resp 18  Ht 5' 7.75" (1.721 m)  Wt 264 lb (119.75 kg)  BMI 40.43 kg/m2 General Appearance: Well developed well nourished, non-toxic appearing in no  apparent distress. Eyes: PERRLA, EOMs, conjunctiva w/ no swelling or erythema or discharge Sinuses: No Frontal/maxillary tenderness ENT/Mouth: Ear canals clear without swelling or erythema.  TM's normal bilaterally with no retractions, bulging, or loss of landmarks.   Neck: Supple, thyroid normal, no notable JVD  Respiratory: Respiratory effort normal, Clear breath sounds anteriorly and posteriorly bilaterally without rales, rhonchi, wheezing or stridor. No retractions or accessory muscle usage. Cardio: RRR with no MRGs.   Abdomen: Soft, + BS.  Non tender, no guarding, rebound, hernias, masses.  Musculoskeletal: Full ROM, 5/5 strength, normal gait.  Skin: Warm, dry without rashes  Neuro: Awake and oriented X 3, Cranial nerves intact. Normal muscle tone, no cerebellar symptoms. Sensation intact.  Psych: normal affect, Insight and Judgment appropriate.     Terri Piedraourtney Forcucci, PA-C 9:50 AM [facility]

## 2015-12-22 ENCOUNTER — Ambulatory Visit (INDEPENDENT_AMBULATORY_CARE_PROVIDER_SITE_OTHER): Payer: Medicare Other | Admitting: Internal Medicine

## 2015-12-22 ENCOUNTER — Encounter: Payer: Self-pay | Admitting: Internal Medicine

## 2015-12-22 VITALS — BP 122/70 | HR 63 | Temp 97.3°F | Resp 16 | Ht 67.75 in | Wt 259.8 lb

## 2015-12-22 DIAGNOSIS — J069 Acute upper respiratory infection, unspecified: Secondary | ICD-10-CM

## 2015-12-22 MED ORDER — FLUTICASONE PROPIONATE 50 MCG/ACT NA SUSP: 2.0000 | Freq: Every day | NASAL | Status: DC

## 2015-12-22 MED ORDER — PREDNISONE 20 MG PO TABS: ORAL_TABLET | ORAL | Status: DC

## 2015-12-22 MED ORDER — PHENYLEPH-PROMETHAZINE-COD 5-6.25-10 MG/5ML PO SYRP: 5.0000 mL | ORAL_SOLUTION | Freq: Every evening | ORAL | Status: DC | PRN

## 2015-12-22 MED ORDER — CETIRIZINE HCL 10 MG PO CAPS: 10.0000 mg | ORAL_CAPSULE | Freq: Every day | ORAL | Status: DC

## 2015-12-22 NOTE — Progress Notes (Signed)
HPI  Patient presents to the office for evaluation of cough.  It has been going on for 5 days.  Patient reports night > day, wet, worse with lying down.  They also endorse change in voice, chills, fever, postnasal drip and nasal congestion, sore throat, itchy watery eyes, periorbital swelling.  .  They have tried benadryl which did help a little bit.  They report that nothing has worked.  They denies other sick contacts.  Review of Systems  Constitutional: Positive for chills and malaise/fatigue. Negative for fever.  HENT: Positive for congestion, ear pain and sore throat.   Respiratory: Positive for cough. Negative for sputum production, shortness of breath and wheezing.   Cardiovascular: Negative for chest pain, palpitations and leg swelling.  Neurological: Negative for headaches.    PE:  Filed Vitals:   12/22/15 1503  BP: 122/70  Pulse: 63  Temp: 97.3 F (36.3 C)  Resp: 16   General:  Alert and non-toxic, WDWN, NAD HEENT: NCAT, PERLA, EOM normal, no occular discharge or erythema.  Nasal mucosal edema with sinus tenderness to palpation.  Oropharynx clear with minimal oropharyngeal edema and erythema.  Mucous membranes moist and pink. Neck:  Cervical adenopathy Chest:  RRR no MRGs.  Lungs clear to auscultation A&P with no wheezes rhonchi or rales.   Abdomen: +BS x 4 quadrants, soft, non-tender, no guarding, rigidity, or rebound. Skin: warm and dry no rash Neuro: A&Ox4, CN II-XII grossly intact  Assessment and Plan:   1. Acute URI -nasal saline -zyrtec nightly -benadryl nightly - Phenyleph-Promethazine-Cod 5-6.25-10 MG/5ML SYRP; Take 5 mLs by mouth at bedtime as needed (for severe cough).  Dispense: 240 mL; Refill: 0 - predniSONE (DELTASONE) 20 MG tablet; 3 tabs po daily x 3 days, then 2 tabs x 3 days, then 1.5 tabs x 3 days, then 1 tab x 3 days, then 0.5 tabs x 3 days  Dispense: 27 tablet; Refill: 0 - fluticasone (FLONASE) 50 MCG/ACT nasal spray; Place 2 sprays into both  nostrils daily.  Dispense: 16 g; Refill: 0 - Cetirizine HCl 10 MG CAPS; Take 1 capsule (10 mg total) by mouth at bedtime.  Dispense: 90 capsule; Refill: 0

## 2015-12-23 ENCOUNTER — Other Ambulatory Visit: Payer: Self-pay | Admitting: Internal Medicine

## 2015-12-23 MED ORDER — AZITHROMYCIN 250 MG PO TABS: ORAL_TABLET | ORAL | Status: DC

## 2016-03-04 ENCOUNTER — Other Ambulatory Visit: Payer: Self-pay | Admitting: Internal Medicine

## 2016-03-04 DIAGNOSIS — E785 Hyperlipidemia, unspecified: Secondary | ICD-10-CM

## 2016-03-04 MED ORDER — ATORVASTATIN CALCIUM 80 MG PO TABS: ORAL_TABLET | ORAL | Status: DC

## 2016-04-24 ENCOUNTER — Other Ambulatory Visit: Payer: Self-pay | Admitting: Internal Medicine

## 2016-09-12 HISTORY — PX: CATARACT EXTRACTION, BILATERAL: SHX1313

## 2016-10-07 ENCOUNTER — Other Ambulatory Visit: Payer: Self-pay | Admitting: Internal Medicine

## 2016-11-03 ENCOUNTER — Other Ambulatory Visit: Payer: Self-pay | Admitting: Internal Medicine

## 2016-11-15 ENCOUNTER — Encounter: Payer: Self-pay | Admitting: Internal Medicine

## 2016-11-15 ENCOUNTER — Ambulatory Visit (INDEPENDENT_AMBULATORY_CARE_PROVIDER_SITE_OTHER): Payer: Medicare Other | Admitting: Internal Medicine

## 2016-11-15 VITALS — BP 122/60 | HR 64 | Temp 97.8°F | Ht 67.75 in | Wt 265.0 lb

## 2016-11-15 DIAGNOSIS — Z0001 Encounter for general adult medical examination with abnormal findings: Secondary | ICD-10-CM

## 2016-11-15 DIAGNOSIS — I10 Essential (primary) hypertension: Secondary | ICD-10-CM

## 2016-11-15 DIAGNOSIS — Z0000 Encounter for general adult medical examination without abnormal findings: Secondary | ICD-10-CM

## 2016-11-15 DIAGNOSIS — R6889 Other general symptoms and signs: Secondary | ICD-10-CM | POA: Diagnosis not present

## 2016-11-15 DIAGNOSIS — E782 Mixed hyperlipidemia: Secondary | ICD-10-CM | POA: Diagnosis not present

## 2016-11-15 DIAGNOSIS — H33339 Multiple defects of retina without detachment, unspecified eye: Secondary | ICD-10-CM

## 2016-11-15 DIAGNOSIS — Z79899 Other long term (current) drug therapy: Secondary | ICD-10-CM | POA: Diagnosis not present

## 2016-11-15 DIAGNOSIS — R7303 Prediabetes: Secondary | ICD-10-CM | POA: Diagnosis not present

## 2016-11-15 DIAGNOSIS — E559 Vitamin D deficiency, unspecified: Secondary | ICD-10-CM | POA: Diagnosis not present

## 2016-11-15 DIAGNOSIS — H43819 Vitreous degeneration, unspecified eye: Secondary | ICD-10-CM

## 2016-11-15 DIAGNOSIS — H25819 Combined forms of age-related cataract, unspecified eye: Secondary | ICD-10-CM

## 2016-11-15 LAB — BASIC METABOLIC PANEL WITH GFR
BUN: 20 mg/dL (ref 7–25)
CO2: 27 mmol/L (ref 20–31)
Calcium: 9.1 mg/dL (ref 8.6–10.4)
Chloride: 103 mmol/L (ref 98–110)
Creat: 1.69 mg/dL — ABNORMAL HIGH (ref 0.50–0.99)
GFR, EST NON AFRICAN AMERICAN: 31 mL/min — AB (ref >=60)
GFR, Est African American: 35 mL/min — ABNORMAL LOW (ref >=60)
GLUCOSE: 92 mg/dL (ref 65–99)
POTASSIUM: 4.2 mmol/L (ref 3.5–5.3)
Sodium: 139 mmol/L (ref 135–146)

## 2016-11-15 LAB — CBC WITH DIFFERENTIAL/PLATELET
BASOS PCT: 0 %
Basophils Absolute: 0 cells/uL (ref 0–200)
Eosinophils Absolute: 136 cells/uL (ref 15–500)
Eosinophils Relative: 2 %
HEMATOCRIT: 36.7 % (ref 35.0–45.0)
Hemoglobin: 12.1 g/dL (ref 11.7–15.5)
LYMPHS ABS: 2448 {cells}/uL (ref 850–3900)
LYMPHS PCT: 36 %
MCH: 30.5 pg (ref 27.0–33.0)
MCHC: 33 g/dL (ref 32.0–36.0)
MCV: 92.4 fL (ref 80.0–100.0)
MONO ABS: 340 {cells}/uL (ref 200–950)
MPV: 9.9 fL (ref 7.5–12.5)
Monocytes Relative: 5 %
NEUTROS ABS: 3876 {cells}/uL (ref 1500–7800)
Neutrophils Relative %: 57 %
Platelets: 246 10*3/uL (ref 140–400)
RBC: 3.97 MIL/uL (ref 3.80–5.10)
RDW: 14.1 % (ref 11.0–15.0)
WBC: 6.8 10*3/uL (ref 3.8–10.8)

## 2016-11-15 LAB — HEPATIC FUNCTION PANEL
ALK PHOS: 87 U/L (ref 33–130)
ALT: 14 U/L (ref 6–29)
AST: 23 U/L (ref 10–35)
Albumin: 3.8 g/dL (ref 3.6–5.1)
BILIRUBIN INDIRECT: 0.3 mg/dL (ref 0.2–1.2)
BILIRUBIN TOTAL: 0.4 mg/dL (ref 0.2–1.2)
Bilirubin, Direct: 0.1 mg/dL (ref <=0.2)
Total Protein: 7.4 g/dL (ref 6.1–8.1)

## 2016-11-15 LAB — LIPID PANEL
CHOL/HDL RATIO: 3.3 ratio (ref <5.0)
Cholesterol: 176 mg/dL (ref <200)
HDL: 54 mg/dL (ref >50)
LDL CALC: 103 mg/dL — AB (ref <100)
Triglycerides: 94 mg/dL (ref <150)
VLDL: 19 mg/dL (ref <30)

## 2016-11-15 LAB — TSH: TSH: 1.87 mIU/L

## 2016-11-15 NOTE — Progress Notes (Signed)
MEDICARE ANNUAL WELLNESS VISIT AND FOLLOW UP Assessment:    1. Essential hypertension -cont ziac -well controlled -dash diet -exercise as tolerated -monitor at home - TSH  2. Mixed hyperlipidemia -improved with statin -cont lipitor - Lipid panel  3. Prediabetes -cont diet and exercise -counseled on weight and proper exercise and diet routines - Hemoglobin A1c  4. Vitamin D deficiency -cont Vit D supplement -needs DEXA  5. Medication management  - CBC with Differential/Platelet - BASIC METABOLIC PANEL WITH GFR - Hepatic function panel  6. Posterior vitreous detachment, unspecified laterality -put in referral to see somebody, over due - Ambulatory referral to Ophthalmology  7. Combined form of senile cataract, unspecified laterality  - Ambulatory referral to Ophthalmology  8. Multiple defects of retina -see above  9. Medicare annual wellness visit, subsequent -due next year  10. Morbid Obesity -diet and exercise was counseled   Over 30 minutes of exam, counseling, chart review, and critical decision making was performed  Future Appointments Date Time Provider Department Center  12/01/2016 9:45 AM Terri Piedraourtney Forcucci, PA-C [facility] None  05/25/2017 10:30 AM Lucky CowboyWilliam McKeown, MD [facility] None  11/15/2017 3:00 PM Terri Piedraourtney Forcucci, PA-C [facility] None     Plan:   During the course of the visit the patient was educated and counseled about appropriate screening and preventive services including:    Pneumococcal vaccine   Influenza vaccine  Prevnar 13  Td vaccine  Screening electrocardiogram  Colorectal cancer screening  Diabetes screening  Glaucoma screening  Nutrition counseling    Subjective:  Susan DiversVictoria Fernandez is a 71 y.o. female who presents for Medicare Annual Wellness Visit and 3 month follow up for HTN, hyperlipidemia, prediabetes, and vitamin D Def.   Her blood pressure has been controlled at home, today their BP is BP: 122/60 She  does not workout. She denies chest pain, shortness of breath, dizziness.  She is on cholesterol medication and denies myalgias. Her cholesterol is at goal. The cholesterol last visit was:   Lab Results  Component Value Date   CHOL 176 11/15/2016   HDL 54 11/15/2016   LDLCALC 103 (H) 11/15/2016   TRIG 94 11/15/2016   CHOLHDL 3.3 11/15/2016  She has been taking a whole tablet daily.  She is not having side effects.    She has a history of diet controlled prediabetes.  She is not currently doing a regular exercise routine.  She reports that she has been traveling a lot.  She does not over restrict her diet.     Lab Results  Component Value Date   HGBA1C 5.8 (H) 11/15/2016   Last GFR Lab Results  Component Value Date   GFRNONAA 31 (L) 11/15/2016     Lab Results  Component Value Date   GFRAA 35 (L) 11/15/2016   Patient is on Vitamin D supplement.   Lab Results  Component Value Date   VD25OH 39 11/03/2015     She has no other complaints at this time.  She reports that she would like to get back to traveling but is currently trying to pay off her new roof that she just had to buy.    She is interested in doing cologuard if it is covered under her insurance.    Medication Review: Current Outpatient Prescriptions on File Prior to Visit  Medication Sig Dispense Refill  . aspirin 81 MG tablet Take 81 mg by mouth daily.    Marland Kitchen. atorvastatin (LIPITOR) 80 MG tablet Take 1/2 to 1 tablet daily l as  directed for Cholesterol 90 tablet 1  . COD LIVER OIL PO Take by mouth daily.    . fluticasone (FLONASE) 50 MCG/ACT nasal spray Place 2 sprays into both nostrils daily. 16 g 0  . Vitamin D, Cholecalciferol, 1000 UNITS CAPS Take 2,000 Units by mouth daily.     No current facility-administered medications on file prior to visit.     Allergies: Allergies  Allergen Reactions  . Ppd [Tuberculin Purified Protein Derivative]   . Sulfa Antibiotics   . Tolectin [Tolmetin]     Current Problems  (verified) has HTN (hypertension); Hyperlipidemia; Prediabetes; Vitamin D deficiency; Medication management; Multiple defects of retina; Combined form of senile cataract; and Posterior vitreous detachment on her problem list.  Screening Tests  There is no immunization history on file for this patient.  Preventative care: Last colonoscopy: Never had, refuses colonoscopy Mammogram: 2016, overdue DEXA:2016  Names of Other Physician/Practitioners you currently use: 1. [facility] here for primary care 2. Dr. Nile Riggs and Dr. Luciana Axe, eye doctor, last visit 2017 3. Dentures , dentist, last done by [facility] Patient Care Team: [name], MD as PCP - General (Internal Medicine)  Surgical: She  has no past surgical history on file. Family Her family history is not on file. Social history  She reports that she has never smoked. She has never used smokeless tobacco. Her alcohol and drug histories are not on file.  MEDICARE WELLNESS OBJECTIVES: Physical activity:   Cardiac risk factors:   Depression/mood screen:   Depression screen [facility] 2/9 11/15/2016  Decreased Interest 0  Down, Depressed, Hopeless 0  PHQ - 2 Score 0    ADLs:  In your present state of health, do you have any difficulty performing the following activities: 11/15/2016  Hearing? N  Vision? Y  Some recent data might be hidden     Cognitive Testing  Alert? Yes  Normal Appearance?Yes  Oriented to person? Yes  Place? Yes   Time? Yes  Recall of three objects?  Yes  Can perform simple calculations? Yes  Displays appropriate judgment?Yes  Can read the correct time from a watch face?Yes  EOL planning: Does Patient Have a Medical Advance Directive?: No Would patient like information on creating a medical advance directive?: Yes (MAU/Ambulatory/Procedural Areas - Information given)   Objective:   Today's Vitals   11/15/16 1443  BP: 122/60  Pulse: 64  Temp: 97.8 F (36.6 C)   TempSrc: Temporal  Weight: 265 lb (120.2 kg)  Height: 5' 7.75" (1.721 m)   Body mass index is 40.59 kg/m.  General appearance: alert, no distress, WD/WN, female HEENT: normocephalic, sclerae anicteric, TMs pearly, nares patent, no discharge or erythema, pharynx normal Oral cavity: MMM, no lesions Neck: supple, no lymphadenopathy, no thyromegaly, no masses Heart: RRR, normal S1, S2, no murmurs Lungs: CTA bilaterally, no wheezes, rhonchi, or rales Abdomen: +bs, soft, non tender, non distended, no masses, no hepatomegaly, no splenomegaly Musculoskeletal: nontender, no swelling, no obvious deformity Extremities: no edema, no cyanosis, no clubbing Pulses: 2+ symmetric, upper and lower extremities, normal cap refill Neurological: alert, oriented x 3, CN2-12 intact, strength normal upper extremities and lower extremities, sensation normal throughout, DTRs 2+ throughout, no cerebellar signs, gait normal Psychiatric: normal affect, behavior normal, pleasant   Medicare Attestation I have personally reviewed: The patient's medical and social history Their use of alcohol, tobacco or illicit drugs Their current medications and supplements The patient's functional ability including ADLs,fall risks, home safety risks, cognitive, and hearing and  visual impairment Diet and physical activities Evidence for depression or mood disorders  The patient's weight, height, BMI, and visual acuity have been recorded in the chart.  I have made referrals, counseling, and provided education to the patient based on review of the above and I have provided the patient with a written personalized care plan for preventive services.     Terri Piedra, PA-C   11/18/2016

## 2016-11-16 LAB — HEMOGLOBIN A1C
HEMOGLOBIN A1C: 5.8 % — AB (ref <5.7)
Mean Plasma Glucose: 120 mg/dL

## 2016-11-17 ENCOUNTER — Other Ambulatory Visit: Payer: Self-pay | Admitting: Internal Medicine

## 2016-12-01 ENCOUNTER — Ambulatory Visit: Payer: Self-pay | Admitting: Internal Medicine

## 2016-12-30 ENCOUNTER — Other Ambulatory Visit: Payer: Self-pay | Admitting: Internal Medicine

## 2016-12-30 DIAGNOSIS — E785 Hyperlipidemia, unspecified: Secondary | ICD-10-CM

## 2016-12-31 NOTE — Progress Notes (Signed)
Assessment and Plan:  Essential hypertension Refilled ziac, continue and stay on it  Renal insufficiency Has stopped NSAIDS and increased water, will continueto mointor - BASIC METABOLIC PANEL WITH GFR  Vitamin D deficiency - VITAMIN D 25 Hydroxy (Vit-D Deficiency, Fractures)   Hyperlipidemia, unspecified hyperlipidemia type - atorvastatin (LIPITOR) 80 MG tablet; Take 1/2 to 1 tablet daily l as directed for Cholesterol  Dispense: 90 tablet; Refill: 1   Future Appointments Date Time Provider Department Center  05/25/2017 10:30 AM [name], MD [facility] None  11/15/2017 3:00 PM Courtney Forcucci, PA-C [facility] None    HPI 71 y.o.female presents for 2 week follow up. She was seen on 11/2016 for 3 month follow up. She is here today for recheck of her kidney function.  She has stopped her NSAIDS for her back, has increased her water, drinking 8-10 glasses of water a day.  Her GFR went from 60-35. She states BP has been good at home, no dizziness, CP, SOB, no edema. She is on Vitamin D 2000 a day She denies any urinary issues, back pain, fever, chills.  Lab Results  Component Value Date   GFRAA 35 (L) 11/15/2016   Blood pressure 140/80, pulse (!) 52, temperature (!) 96.6 F (35.9 C), resp. rate 16, height 5' 7.75" (1.721 m), weight 266 lb 9.6 oz (120.9 kg).   Past Medical History:  Diagnosis Date  . Cataract   . GERD (gastroesophageal reflux disease)   . Hyperlipidemia   . Hypertension 1995  . IBS (irritable bowel syndrome)   . Morbid obesity (HCC)   . Prediabetes 2016  . Venous insufficiency      Allergies  Allergen Reactions  . Ppd [Tuberculin Purified Protein Derivative]   . Sulfa Antibiotics   . Tolectin [Tolmetin]     Current Outpatient Prescriptions on File Prior to Visit  Medication Sig  . aspirin 81 MG tablet Take 81 mg by mouth daily.  . bisoprolol-hydrochlorothiazide (ZIAC) 5-6.25 MG tablet TAKE 1 TABLET BY MOUTH DAILY FOR BLOOD PRESSURE  **NEED OV FOR REFILLS**  . COD LIVER OIL PO Take by mouth daily.  . fluticasone (FLONASE) 50 MCG/ACT nasal spray Place 2 sprays into both nostrils daily.  . Vitamin D, Cholecalciferol, 1000 UNITS CAPS Take 2,000 Units by mouth daily.  Marland Kitchen atorvastatin (LIPITOR) 80 MG tablet Take 1/2 to 1 tablet daily l as directed for Cholesterol   No current facility-administered medications on file prior to visit.     ROS: all negative except above.   Physical Exam: Filed Weights   01/02/17 1149  Weight: 266 lb 9.6 oz (120.9 kg)   BP 140/80   Pulse (!) 52   Temp (!) 96.6 F (35.9 C)   Resp 16   Ht 5' 7.75" (1.721 m)   Wt 266 lb 9.6 oz (120.9 kg)   BMI 40.84 kg/m  General Appearance: Well nourished, in no apparent distress. Eyes: PERRLA, EOMs, conjunctiva no swelling or erythema Sinuses: No Frontal/maxillary tenderness ENT/Mouth: Ext aud canals clear, TMs without erythema, bulging. No erythema, swelling, or exudate on post pharynx.  Tonsils not swollen or erythematous. Hearing normal.  Neck: Supple, thyroid normal.  Respiratory: Respiratory effort normal, BS equal bilaterally without rales, rhonchi, wheezing or stridor.  Cardio: RRR with no MRGs. Brisk peripheral pulses without edema.  Abdomen: Soft, + BS.  Non tender, no guarding, rebound, hernias, masses. Lymphatics: Non tender without lymphadenopathy.  Musculoskeletal: Full ROM, 5/5 strength, normal gait.  Skin: Warm, dry without  rashes, lesions, ecchymosis.  Neuro: Cranial nerves intact. Normal muscle tone, no cerebellar symptoms. Sensation intact.  Psych: Awake and oriented X 3, normal affect, Insight and Judgment appropriate.     Quentin Mulling, PA-C 12:22 PM [facility]

## 2017-01-02 ENCOUNTER — Ambulatory Visit (INDEPENDENT_AMBULATORY_CARE_PROVIDER_SITE_OTHER): Payer: Medicare Other | Admitting: Physician Assistant

## 2017-01-02 ENCOUNTER — Encounter: Payer: Self-pay | Admitting: Internal Medicine

## 2017-01-02 VITALS — BP 140/80 | HR 52 | Temp 96.6°F | Resp 16 | Ht 67.75 in | Wt 266.6 lb

## 2017-01-02 DIAGNOSIS — E785 Hyperlipidemia, unspecified: Secondary | ICD-10-CM

## 2017-01-02 DIAGNOSIS — E559 Vitamin D deficiency, unspecified: Secondary | ICD-10-CM | POA: Diagnosis not present

## 2017-01-02 DIAGNOSIS — I10 Essential (primary) hypertension: Secondary | ICD-10-CM

## 2017-01-02 DIAGNOSIS — Z79899 Other long term (current) drug therapy: Secondary | ICD-10-CM | POA: Diagnosis not present

## 2017-01-02 DIAGNOSIS — N289 Disorder of kidney and ureter, unspecified: Secondary | ICD-10-CM | POA: Diagnosis not present

## 2017-01-02 LAB — BASIC METABOLIC PANEL WITH GFR
BUN: 12 mg/dL (ref 7–25)
CALCIUM: 9.2 mg/dL (ref 8.6–10.4)
CO2: 25 mmol/L (ref 20–31)
CREATININE: 1.06 mg/dL — AB (ref 0.50–0.99)
Chloride: 102 mmol/L (ref 98–110)
GFR, Est African American: 62 mL/min (ref >=60)
GFR, Est Non African American: 54 mL/min — ABNORMAL LOW (ref >=60)
GLUCOSE: 93 mg/dL (ref 65–99)
Potassium: 4.1 mmol/L (ref 3.5–5.3)
SODIUM: 138 mmol/L (ref 135–146)

## 2017-01-02 MED ORDER — BISOPROLOL-HYDROCHLOROTHIAZIDE 5-6.25 MG PO TABS: ORAL_TABLET | ORAL | 1 refills | Status: DC

## 2017-01-02 MED ORDER — ATORVASTATIN CALCIUM 80 MG PO TABS: ORAL_TABLET | ORAL | 1 refills | Status: DC

## 2017-01-02 NOTE — Patient Instructions (Signed)
Chronic Kidney Disease, Adult Chronic kidney disease (CKD) occurs when the kidneys are damaged during a period of 3 or more months. The kidneys are two organs that do many important jobs in the body, which include:  Removing wastes and extra fluids from the blood.  Making hormones that maintain the amount of fluid in your tissues and blood vessels.  Maintaining the right amount of fluids and chemicals in the body. A small amount of kidney damage may not cause problems, but a large amount of damage may make it difficult or impossible for the kidneys to work the way they should. If steps are not taken to slow down the kidney damage or stop it from getting worse, the kidneys may stop working permanently (end stage kidney disease). Most of the time, CKD does not go away, but it can often be controlled. People who have CKD can usually live normal lives. What are the causes? The most common causes of this condition are diabetes and high blood pressure (hypertension). Other causes include:  Heart and blood vessel (cardiovascular) disease.  Kidney diseases:  Glomerulonephritis.  Interstitial nephritis.  Polycystic kidney disease.  Renal vascular disease.  Diseases that affect the immune system.  Genetic diseases.  Medicines that damage the kidneys, such as anti-inflammatory medicines.  Poisoning.  Being around or in contact with poisonous (toxic) substances.  A kidney or urinary infection that occurs again (recurs).  Vasculitis.  Repeat kidney infections.  A problem with urine flow that may be caused by:  Cancer.  Having kidney stones more than one time.  An enlarged prostate in males. What increases the risk? This condition is more likely to develop in people who are:  Older than age 11.  Female.  Of African-American descent.  Current smokers or former smokers.  Obese. You may also have an increased risk for CKD if you have a family history of CKDor  youfrequently take medicines that are damaging to the kidneys. What are the signs or symptoms? Symptoms develop slowly and may not be obvious until the kidney damage becomes severe. It is possible to have a kidney disease for years without showing any symptoms. Symptoms of this condition can include:  Swelling (edema) of the face, legs, ankles, or feet.  Numbness, tingling, or loss of feeling (sensation) in the hands or feet.  Tiredness (lethargy).  Nausea or vomiting.  Confusion or trouble concentrating.  Problems with urination, such as:  Painful or burning feeling during urination.  Decreased urine production.  Frequent urination, especially at night.  Bloody urine.  Muscle twitches and cramps, especially in the legs.  Shortness of breath.  Weakness.  Constant itchiness.  Loss of appetite.  Metallic taste in the mouth.  Trouble sleeping.  Pale lining of the eyelids and surface of the eye (conjunctiva). How is this diagnosed? This condition may be diagnosed with various tests. Tests may include:  Blood tests.  Urine tests.  Imaging tests.  A test in which a sample of tissue is removed from the kidneys to be looked at under a microscope (kidney biopsy). These test results will help your health care provider determine what class of CKD you have. How is this treated? Most cases of CKD cannot be cured. Treatment usually involves relieving symptoms and preventing or slowing the progression of the disease. Treatment may include:  A special diet, which may require you to avoid alcohol, salty foods (sodium), and foods that are high in potassium, calcium, and protein.  Medicines:  To lower  blood pressure.  To relieve low blood count (anemia).  To relieve swelling.  To protect your bones.  To improve the balance of electrolytes in your blood.  Removing toxic waste from the body by using hemodialysis or peritoneal dialysis if the kidneys can no longer do  their job (kidney failure).  Management of other conditions that are causing your CKD or making it worse. Follow these instructions at home:  Follow your prescribed diet.  Take over-the-counter and prescription medicines only as told by your health care provider.  Do not take any new medicines unless approved by your health care provider. Many medicines can worsen your kidney damage.  Do not take any vitamin and mineral supplements unless approved by your health care provider. Many nutritional supplements can worsen your kidney damage.  The dose of some medicines that you take may need to be adjusted.  Do not use any tobacco products, such as cigarettes, chewing tobacco, and e-cigarettes. If you need help quitting, ask your health care provider.  Keep all follow-up visits as told by your health care provider. This is important.  Keep track of your blood pressure. Report changes in your blood pressure as told by your health care provider.  Achieve and maintain a healthy weight. If you need help with this, ask your health care provider.  Start or continue an exercise plan. Try to exercise at least 30 minutes a day, 5 days a week.  Stay current with immunizations as told by your health care provider. Where to find more information:  American Association of Kidney Patients: ResidentialShow.is  [employer]: www.kidney.org  American [employer]: FightingMatch.com.ee  [facility]: www.lifeoptions.org and www.kidneyschool.org Contact a health care provider if:  Your symptoms get worse.  You develop new symptoms. Get help right away if:  You develop symptoms of end-stage kidney disease, which include:  Headaches.  Abnormally dark or light skin.  Numbness in the hands or feet.  Easy bruising.  Frequent hiccups.  Chest pain.  Shortness of breath.  End of menstruation in women.  You have a fever.  You have decreased urine production.  You  have pain or bleeding when you urinate. This information is not intended to replace advice given to you by your health care provider. Make sure you discuss any questions you have with your health care provider. Document Released: 06/07/2008 Document Revised: 02/04/2016 Document Reviewed: 04/27/2012 Elsevier Interactive Patient Education  2017 Elsevier Inc.   Vitamin D goal is between 60-80  Please make sure that you are taking your Vitamin D as directed.   It is very important as a natural anti-inflammatory   helping hair, skin, and nails, as well as reducing stroke and heart attack risk.   It helps your bones and helps with mood.  We want you on at least 5000 IU daily  It also decreases numerous cancer risks so please take it as directed.   Low Vit D is associated with a 200-300% higher risk for CANCER   and 200-300% higher risk for HEART   ATTACK  &  STROKE.    .....................................Marland Kitchen  It is also associated with higher death rate at younger ages,   autoimmune diseases like Rheumatoid arthritis, Lupus, Multiple Sclerosis.     Also many other serious conditions, like depression, Alzheimer's  Dementia, infertility, muscle aches, fatigue, fibromyalgia - just to name a few.  +++++++++++++++++++  Can get liquid vitamin D from [place]  OR here in [place] at  [facility]  [address] [place], [place] [address] Or you can try earth fare

## 2017-01-03 LAB — VITAMIN D 25 HYDROXY (VIT D DEFICIENCY, FRACTURES): Vit D, 25-Hydroxy: 57 ng/mL (ref 30–100)

## 2017-01-03 NOTE — Progress Notes (Signed)
Pt aware of lab results & voiced understanding of those results.

## 2017-05-24 NOTE — Assessment & Plan Note (Deleted)
   Currently well managed by diet and exercise.   Check A1C

## 2017-05-24 NOTE — Progress Notes (Deleted)
FOLLOW UP  Assessment and Plan:   HTN (hypertension)  Well managed on current medication: bisoprolol-hctz 5-6.25 mg tab daily  Monitor blood pressure at home.  Continue DASH diet.    Reminder to go to the ER if any CP, SOB, nausea, dizziness, severe HA, changes vision/speech, left arm numbness and tingling and jaw pain.   Hyperlipidemia  Continue medications: atorvastatin 80 mg tab- 1/2-1 tab daily  Continue cod liver oil for omega 3 supplement  Continue diet and exercise.   Check lipid panel.    Prediabetes  Currently well managed by diet and exercise.   Check A1C   Vitamin D deficiency  Continue medications: cholecalciferol 1000 unit caps, 2 tabs daily  Check vitamin D routinely  Medication management  Review labs as appropriate    Continue diet and meds as discussed. Further disposition pending results of labs. Discussed med's effects and SE's.   Over 30 minutes of exam, counseling, chart review, and critical decision making was performed.   Future Appointments Date Time Provider Department Center  05/25/2017 10:30 AM Judd Gaudier, NP [facility] None    ----------------------------------------------------------------------------------------------------------------------  HPI 71 y.o. female  presents for 3 month follow up on hypertension, cholesterol, diabetes and vitamin D deficiency.   Her blood pressure {HAS HAS NOT:18834} been controlled at home, today their BP is    She {DOES_DOES ZOX:09604} workout. She denies chest pain, shortness of breath, dizziness.   She is on cholesterol medication and denies myalgias. Her cholesterol is at goal. The cholesterol last visit was:   Lab Results  Component Value Date   CHOL 176 11/15/2016   HDL 54 11/15/2016   LDLCALC 103 (H) 11/15/2016   TRIG 94 11/15/2016   CHOLHDL 3.3 11/15/2016    She has been working on diet and exercise for prediabetes, and denies {Symptoms; diabetes w/o none:19199}. Last A1C  in the office was:  Lab Results  Component Value Date   HGBA1C 5.8 (H) 11/15/2016   Patient is on Vitamin D supplement; level at most recent visit was:    Lab Results  Component Value Date   VD25OH 57 01/02/2017         Current Medications:  Current Outpatient Prescriptions on File Prior to Visit  Medication Sig  . aspirin 81 MG tablet Take 81 mg by mouth daily.  Marland Kitchen atorvastatin (LIPITOR) 80 MG tablet Take 1/2 to 1 tablet daily l as directed for Cholesterol  . bisoprolol-hydrochlorothiazide (ZIAC) 5-6.25 MG tablet TAKE 1 TABLET BY MOUTH DAILY FOR BLOOD PRESSURE  . COD LIVER OIL PO Take by mouth daily.  . fluticasone (FLONASE) 50 MCG/ACT nasal spray Place 2 sprays into both nostrils daily.  . Vitamin D, Cholecalciferol, 1000 UNITS CAPS Take 2,000 Units by mouth daily.   No current facility-administered medications on file prior to visit.      Allergies:  Allergies  Allergen Reactions  . Ppd [Tuberculin Purified Protein Derivative]   . Sulfa Antibiotics   . Tolectin [Tolmetin]      Medical History:  Past Medical History:  Diagnosis Date  . Cataract   . GERD (gastroesophageal reflux disease)   . Hyperlipidemia   . Hypertension 1995  . IBS (irritable bowel syndrome)   . Morbid obesity (HCC)   . Prediabetes 2016  . Venous insufficiency    Family history- Reviewed and unchanged Social history- Reviewed and unchanged   Review of Systems:  ROS    Physical Exam: There were no vitals taken for this visit. Wt Readings  from Last 3 Encounters:  01/02/17 266 lb 9.6 oz (120.9 kg)  11/15/16 265 lb (120.2 kg)  12/22/15 259 lb 12.8 oz (117.8 kg)   General Appearance: Well nourished, in no apparent distress. Eyes: PERRLA, EOMs, conjunctiva no swelling or erythema Sinuses: No Frontal/maxillary tenderness ENT/Mouth: Ext aud canals clear, TMs without erythema, bulging. No erythema, swelling, or exudate on post pharynx.  Tonsils not swollen or erythematous. Hearing normal.   Neck: Supple, thyroid normal.  Respiratory: Respiratory effort normal, BS equal bilaterally without rales, rhonchi, wheezing or stridor.  Cardio: RRR with no MRGs. Brisk peripheral pulses without edema.  Abdomen: Soft, + BS.  Non tender, no guarding, rebound, hernias, masses. Lymphatics: Non tender without lymphadenopathy.  Musculoskeletal: Full ROM, 5/5 strength, {PSY - GAIT AND STATION:22860} gait Skin: Warm, dry without rashes, lesions, ecchymosis.  Neuro: Cranial nerves intact. No cerebellar symptoms.  Psych: Awake and oriented X 3, normal affect, Insight and Judgment appropriate.    Dan MakerAshley C Sherrian Nunnelley, NP 2:07 PM [facility]

## 2017-05-24 NOTE — Assessment & Plan Note (Deleted)
   Review labs as appropriate

## 2017-05-24 NOTE — Assessment & Plan Note (Deleted)
   Continue medications: atorvastatin 80 mg tab- 1/2-1 tab daily  Continue cod liver oil for omega 3 supplement  Continue diet and exercise.   Check lipid panel.

## 2017-05-24 NOTE — Assessment & Plan Note (Deleted)
   Continue medications: cholecalciferol 1000 unit caps, 2 tabs daily  Check vitamin D routinely

## 2017-05-24 NOTE — Assessment & Plan Note (Deleted)
   Well managed on current medication: bisoprolol-hctz 5-6.25 mg tab daily  Monitor blood pressure at home.  Continue DASH diet.    Reminder to go to the ER if any CP, SOB, nausea, dizziness, severe HA, changes vision/speech, left arm numbness and tingling and jaw pain.

## 2017-05-25 ENCOUNTER — Ambulatory Visit: Payer: Self-pay | Admitting: Adult Health

## 2017-06-20 DIAGNOSIS — E669 Obesity, unspecified: Secondary | ICD-10-CM | POA: Insufficient documentation

## 2017-06-20 NOTE — Progress Notes (Signed)
FOLLOW UP  Assessment and Plan:    Hypertension  Continue medication: bisoprolol-hctz 5-6.25 mg daily, adding losartan 50 mg daily- follow up with labs in 4 weeks  Monitor blood pressure at home   Continue DASH diet.    Reminder to go to the ER if any CP, SOB, nausea, dizziness, severe HA, changes vision/speech, left arm numbness and tingling and jaw pain.  Cholesterol  Continue medications: atorvastatin 80 mg every other day  Continue diet and exercise.   Check lipid panel.    Prediabetes   Currently well managed/trending down with lifestyle changes.   Continue diet and exercise.   Perform daily foot/skin check, notify office of any concerning changes.   Check A1C  Vitamin D Def  Continue medications: cholecalciferol 2000 IU daily  Check Vit D level  CKD stage III  Extended discussion about managing sugars, increase fluids, avoid extremes of hydration/dehydration, avoid NSAIDs  Has not had microalbumin/urine checked in a while, with recent dip in renal function- will order today   Continue diet and meds as discussed. Further disposition pending results of labs. Discussed med's effects and SE's.   Over 30 minutes of exam, counseling, chart review, and critical decision making was performed.    Future Appointments Date Time Provider Department Center  11/21/2017 10:00 AM Judd Gaudier, NP [facility] None    ----------------------------------------------------------------------------------------------------------------------  HPI 71 y.o. female  presents for 6 month follow up on morbid obesity, hypertension, cholesterol, prediabetes and vitamin D deficiency. Since we last saw her, Dr. Luciana Axe has performed both cataract surgeries in May and June of this year; has a follow up next April. No issues.   Diet: She has been replacing some meals with slimfast, cutting back on sweet tea (from 2 full glass down to one a day). Cut out milk for the past 6 months.     Exercise: yardword, housework. Bought some exercise outfits and is planning on joining a new local gym with her silver sneakers card.  Water: 3-4 bottles of water, 1 glass sweet tea  BMI is Body mass index is 40.01 kg/m., she is working on diet and exercise. Ideal weight: under 154 lb Wt Readings from Last 3 Encounters:  06/21/17 261 lb 3.2 oz (118.5 kg)  01/02/17 266 lb 9.6 oz (120.9 kg)  11/15/16 265 lb (120.2 kg)   Her blood pressure has been controlled at home, running 140/60-70, today their BP is BP: 140/72  She does not workout. She denies chest pain, shortness of breath, dizziness.   She is on cholesterol medication and denies myalgias. Her cholesterol is at goal. The cholesterol last visit was:   Lab Results  Component Value Date   CHOL 176 11/15/2016   HDL 54 11/15/2016   LDLCALC 103 (H) 11/15/2016   TRIG 94 11/15/2016   CHOLHDL 3.3 11/15/2016    She has been working on diet and exercise for prediabetes, and denies foot ulcerations, hyperglycemia, hypoglycemia , increased appetite, nausea and paresthesia of the feet. Last A1C in the office was near goal:  Lab Results  Component Value Date   HGBA1C 5.8 (H) 11/15/2016   Patient is on Vitamin D supplement and near goal:  Lab Results  Component Value Date   VD25OH 57 01/02/2017         Current Medications:  Current Outpatient Prescriptions on File Prior to Visit  Medication Sig  . aspirin 81 MG tablet Take 81 mg by mouth daily.  . bisoprolol-hydrochlorothiazide (ZIAC) 5-6.25 MG tablet TAKE 1  TABLET BY MOUTH DAILY FOR BLOOD PRESSURE  . COD LIVER OIL PO Take by mouth daily.  . fluticasone (FLONASE) 50 MCG/ACT nasal spray Place 2 sprays into both nostrils daily.  . Vitamin D, Cholecalciferol, 1000 UNITS CAPS Take 2,000 Units by mouth daily.   No current facility-administered medications on file prior to visit.      Allergies:  Allergies  Allergen Reactions  . Ppd [Tuberculin Purified Protein Derivative]   .  Sulfa Antibiotics   . Tolectin [Tolmetin]      Medical History:  Past Medical History:  Diagnosis Date  . Cataract   . GERD (gastroesophageal reflux disease)   . Hyperlipidemia   . Hypertension 1995  . IBS (irritable bowel syndrome)   . Morbid obesity (HCC)   . 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 Posterior vitreous detachment 10/29/2015  . Prediabetes 2016  . Venous insufficiency    Family history- Reviewed and unchanged Social history- Reviewed and unchanged   Review of Systems:  ROS    Physical Exam: BP 140/72   Pulse 63   Temp (!) 97.3 F (36.3 C)   Resp 14   Ht 5' 7.75" (1.721 m)   Wt 261 lb 3.2 oz (118.5 kg)   SpO2 95%   BMI 40.01 kg/m  Wt Readings from Last 3 Encounters:  06/21/17 261 lb 3.2 oz (118.5 kg)  01/02/17 266 lb 9.6 oz (120.9 kg)  11/15/16 265 lb (120.2 kg)   General Appearance: Well nourished, in no apparent distress. Eyes: PERRLA, EOMs, conjunctiva no swelling or erythema Sinuses: No Frontal/maxillary tenderness ENT/Mouth: Ext aud canals clear, TMs without erythema, bulging. No erythema, swelling, or exudate on post pharynx.  Tonsils not swollen or erythematous. Hearing normal.  Neck: Supple, thyroid normal.  Respiratory: Respiratory effort normal, BS equal bilaterally without rales, rhonchi, wheezing or stridor.  Cardio: RRR with no MRGs. Brisk peripheral pulses without edema.  Abdomen: Soft, + BS.  Non tender, no guarding, rebound, hernias, masses. Lymphatics: Non tender without lymphadenopathy.  Musculoskeletal: Full ROM, 5/5 strength, Normal gait Skin: Warm, dry without rashes, lesions, ecchymosis.  Neuro: Cranial nerves intact. No cerebellar symptoms.  Psych: Awake and oriented X 3, normal affect, Insight and Judgment appropriate.    Dan Maker, NP 11:41 AM [facility]

## 2017-06-21 ENCOUNTER — Ambulatory Visit (INDEPENDENT_AMBULATORY_CARE_PROVIDER_SITE_OTHER): Payer: Medicare Other | Admitting: Adult Health

## 2017-06-21 ENCOUNTER — Encounter: Payer: Self-pay | Admitting: Adult Health

## 2017-06-21 ENCOUNTER — Other Ambulatory Visit: Payer: Self-pay | Admitting: Physician Assistant

## 2017-06-21 VITALS — BP 140/72 | HR 63 | Temp 97.3°F | Resp 14 | Ht 67.75 in | Wt 261.2 lb

## 2017-06-21 DIAGNOSIS — H25813 Combined forms of age-related cataract, bilateral: Secondary | ICD-10-CM

## 2017-06-21 DIAGNOSIS — E782 Mixed hyperlipidemia: Secondary | ICD-10-CM | POA: Diagnosis not present

## 2017-06-21 DIAGNOSIS — E559 Vitamin D deficiency, unspecified: Secondary | ICD-10-CM | POA: Diagnosis not present

## 2017-06-21 DIAGNOSIS — I10 Essential (primary) hypertension: Secondary | ICD-10-CM | POA: Diagnosis not present

## 2017-06-21 DIAGNOSIS — N183 Chronic kidney disease, stage 3 unspecified: Secondary | ICD-10-CM

## 2017-06-21 DIAGNOSIS — Z79899 Other long term (current) drug therapy: Secondary | ICD-10-CM | POA: Diagnosis not present

## 2017-06-21 DIAGNOSIS — R7303 Prediabetes: Secondary | ICD-10-CM | POA: Diagnosis not present

## 2017-06-21 MED ORDER — LOSARTAN POTASSIUM 50 MG PO TABS: 50.0000 mg | ORAL_TABLET | Freq: Every day | ORAL | 3 refills | Status: DC

## 2017-06-21 MED ORDER — ATORVASTATIN CALCIUM 80 MG PO TABS: 80.0000 mg | ORAL_TABLET | ORAL | 3 refills | Status: DC

## 2017-06-21 NOTE — Patient Instructions (Addendum)
Try agave syrup in your tea instead of sugar or honey.   Chronic Kidney Disease, Adult Chronic kidney disease (CKD) occurs when the kidneys are damaged during a period of 3 or more months. The kidneys are two organs that do many important jobs in the body, which include:  Removing wastes and extra fluids from the blood.  Making hormones that maintain the amount of fluid in your tissues and blood vessels.  Maintaining the right amount of fluids and chemicals in the body.  A small amount of kidney damage may not cause problems, but a large amount of damage may make it difficult or impossible for the kidneys to work the way they should. If steps are not taken to slow down the kidney damage or stop it from getting worse, the kidneys may stop working permanently (end stage kidney disease). Most of the time, CKD does not go away, but it can often be controlled. People who have CKD can usually live normal lives. What are the causes? The most common causes of this condition are diabetes and high blood pressure (hypertension). Other causes include:  Heart and blood vessel (cardiovascular) disease.  Kidney diseases: ? Glomerulonephritis. ? Interstitial nephritis. ? Polycystic kidney disease. ? Renal vascular disease.  Diseases that affect the immune system.  Genetic diseases.  Medicines that damage the kidneys, such as anti-inflammatory medicines.  Poisoning.  Being around or in contact with poisonous (toxic) substances.  A kidney or urinary infection that occurs again (recurs).  Vasculitis.  Repeat kidney infections.  A problem with urine flow that may be caused by: ? Cancer. ? Having kidney stones more than one time. ? An enlarged prostate in males.  What increases the risk? This condition is more likely to develop in people who are:  Older than age 16.  Female.  Of African-American descent.  Current smokers or former smokers.  Obese.  You may also have an increased  risk for CKD if you have a family history of CKDor youfrequently take medicines that are damaging to the kidneys. What are the signs or symptoms? Symptoms develop slowly and may not be obvious until the kidney damage becomes severe. It is possible to have a kidney disease for years without showing any symptoms. Symptoms of this condition can include:  Swelling (edema) of the face, legs, ankles, or feet.  Numbness, tingling, or loss of feeling (sensation) in the hands or feet.  Tiredness (lethargy).  Nausea or vomiting.  Confusion or trouble concentrating.  Problems with urination, such as: ? Painful or burning feeling during urination. ? Decreased urine production. ? Frequent urination, especially at night. ? Bloody urine.  Muscle twitches and cramps, especially in the legs.  Shortness of breath.  Weakness.  Constant itchiness.  Loss of appetite.  Metallic taste in the mouth.  Trouble sleeping.  Pale lining of the eyelids and surface of the eye (conjunctiva).  How is this diagnosed? This condition may be diagnosed with various tests. Tests may include:  Blood tests.  Urine tests.  Imaging tests.  A test in which a sample of tissue is removed from the kidneys to be looked at under a microscope (kidney biopsy).  These test results will help your health care provider determine what class of CKD you have. How is this treated? Most cases of CKD cannot be cured. Treatment usually involves relieving symptoms and preventing or slowing the progression of the disease. Treatment may include:  A special diet, which may require you to avoid alcohol, salty  foods (sodium), and foods that are high in potassium, calcium, and protein.  Medicines: ? To lower blood pressure. ? To relieve low blood count (anemia). ? To relieve swelling. ? To protect your bones. ? To improve the balance of electrolytes in your blood.  Removing toxic waste from the body by using hemodialysis or  peritoneal dialysis if the kidneys can no longer do their job (kidney failure).  Management of other conditions that are causing your CKD or making it worse.  Follow these instructions at home:  Follow your prescribed diet.  Take over-the-counter and prescription medicines only as told by your health care provider. ? Do not take any new medicines unless approved by your health care provider. Many medicines can worsen your kidney damage. ? Do not take any vitamin and mineral supplements unless approved by your health care provider. Many nutritional supplements can worsen your kidney damage. ? The dose of some medicines that you take may need to be adjusted.  Do not use any tobacco products, such as cigarettes, chewing tobacco, and e-cigarettes. If you need help quitting, ask your health care provider.  Keep all follow-up visits as told by your health care provider. This is important.  Keep track of your blood pressure. Report changes in your blood pressure as told by your health care provider.  Achieve and maintain a healthy weight. If you need help with this, ask your health care provider.  Start or continue an exercise plan. Try to exercise at least 30 minutes a day, 5 days a week.  Stay current with immunizations as told by your health care provider. Where to find more information:  American Association of Kidney Patients: ResidentialShow.is  [employer]: www.kidney.org  American [employer]: FightingMatch.com.ee  [facility]: www.lifeoptions.org and www.kidneyschool.org Contact a health care provider if:  Your symptoms get worse.  You develop new symptoms. Get help right away if:  You develop symptoms of end-stage kidney disease, which include: ? Headaches. ? Abnormally dark or light skin. ? Numbness in the hands or feet. ? Easy bruising. ? Frequent hiccups. ? Chest pain. ? Shortness of breath. ? End of menstruation in women.  You have a  fever.  You have decreased urine production.  You have pain or bleeding when you urinate. This information is not intended to replace advice given to you by your health care provider. Make sure you discuss any questions you have with your health care provider. Document Released: 06/07/2008 Document Revised: 02/04/2016 Document Reviewed: 04/27/2012 Elsevier Interactive Patient Education  2017 Elsevier Inc.      Bad carbs also include fruit juice, alcohol, and sweet tea. These are empty calories that do not signal to your brain that you are full.   Please remember the good carbs are still carbs which convert into sugar. So please measure them out no more than 1/2-1 cup of rice, oatmeal, pasta, and beans  Veggies are however free foods! Pile them on.   Not all fruit is created equal. Please see the list below, the fruit at the bottom is higher in sugars than the fruit at the top. Please avoid all dried fruits.

## 2017-06-22 LAB — BASIC METABOLIC PANEL WITH GFR
BUN/Creatinine Ratio: 11 (calc) (ref 6–22)
BUN: 13 mg/dL (ref 7–25)
CALCIUM: 9.4 mg/dL (ref 8.6–10.4)
CO2: 29 mmol/L (ref 20–32)
CREATININE: 1.14 mg/dL — AB (ref 0.50–0.99)
Chloride: 102 mmol/L (ref 98–110)
GFR, Est African American: 57 mL/min/{1.73_m2} — ABNORMAL LOW (ref >=60)
GFR, Est Non African American: 49 mL/min/{1.73_m2} — ABNORMAL LOW (ref >=60)
GLUCOSE: 83 mg/dL (ref 65–99)
Potassium: 4.5 mmol/L (ref 3.5–5.3)
SODIUM: 140 mmol/L (ref 135–146)

## 2017-06-22 LAB — CBC WITH DIFFERENTIAL/PLATELET
BASOS PCT: 0.4 %
Basophils Absolute: 22 cells/uL (ref 0–200)
EOS PCT: 1.6 %
Eosinophils Absolute: 90 cells/uL (ref 15–500)
HEMATOCRIT: 35.9 % (ref 35.0–45.0)
HEMOGLOBIN: 12.1 g/dL (ref 11.7–15.5)
LYMPHS ABS: 1792 {cells}/uL (ref 850–3900)
MCH: 30.9 pg (ref 27.0–33.0)
MCHC: 33.7 g/dL (ref 32.0–36.0)
MCV: 91.8 fL (ref 80.0–100.0)
MONOS PCT: 5.4 %
MPV: 10.6 fL (ref 7.5–12.5)
NEUTROS ABS: 3394 {cells}/uL (ref 1500–7800)
Neutrophils Relative %: 60.6 %
Platelets: 233 10*3/uL (ref 140–400)
RBC: 3.91 10*6/uL (ref 3.80–5.10)
RDW: 13 % (ref 11.0–15.0)
Total Lymphocyte: 32 %
WBC mixed population: 302 cells/uL (ref 200–950)
WBC: 5.6 10*3/uL (ref 3.8–10.8)

## 2017-06-22 LAB — URINALYSIS, COMPLETE
Bacteria, UA: NONE SEEN /HPF
Bilirubin Urine: NEGATIVE
GLUCOSE, UA: NEGATIVE
Hgb urine dipstick: NEGATIVE
Ketones, ur: NEGATIVE
Nitrite: NEGATIVE
PH: 7 (ref 5.0–8.0)
Protein, ur: NEGATIVE
RBC / HPF: NONE SEEN /HPF (ref 0–2)
SPECIFIC GRAVITY, URINE: 1.02 (ref 1.001–1.03)

## 2017-06-22 LAB — HEMOGLOBIN A1C
EAG (MMOL/L): 6.5 (calc)
Hgb A1c MFr Bld: 5.7 % of total Hgb — ABNORMAL HIGH (ref <5.7)
Mean Plasma Glucose: 117 (calc)

## 2017-06-22 LAB — MAGNESIUM: Magnesium: 2.3 mg/dL (ref 1.5–2.5)

## 2017-06-22 LAB — HEPATIC FUNCTION PANEL
AG RATIO: 1 (calc) (ref 1.0–2.5)
ALBUMIN MSPROF: 3.8 g/dL (ref 3.6–5.1)
ALKALINE PHOSPHATASE (APISO): 86 U/L (ref 33–130)
ALT: 8 U/L (ref 6–29)
AST: 18 U/L (ref 10–35)
Bilirubin, Direct: 0.1 mg/dL (ref 0.0–0.2)
GLOBULIN: 3.7 g/dL (ref 1.9–3.7)
Indirect Bilirubin: 0.3 mg/dL (calc) (ref 0.2–1.2)
TOTAL PROTEIN: 7.5 g/dL (ref 6.1–8.1)
Total Bilirubin: 0.4 mg/dL (ref 0.2–1.2)

## 2017-06-22 LAB — LIPID PANEL
CHOL/HDL RATIO: 3.7 (calc) (ref <5.0)
CHOLESTEROL: 194 mg/dL (ref <200)
HDL: 53 mg/dL (ref >50)
LDL Cholesterol (Calc): 114 mg/dL (calc) — ABNORMAL HIGH
Non-HDL Cholesterol (Calc): 141 mg/dL (calc) — ABNORMAL HIGH (ref <130)
TRIGLYCERIDES: 152 mg/dL — AB (ref <150)

## 2017-06-22 LAB — VITAMIN D 25 HYDROXY (VIT D DEFICIENCY, FRACTURES): VIT D 25 HYDROXY: 52 ng/mL (ref 30–100)

## 2017-06-22 LAB — MICROALBUMIN / CREATININE URINE RATIO
CREATININE, URINE: 216 mg/dL (ref 20–275)
MICROALB UR: 1.8 mg/dL
MICROALB/CREAT RATIO: 8 ug/mg{creat} (ref <30)

## 2017-06-22 LAB — TSH: TSH: 1.55 mIU/L (ref 0.40–4.50)

## 2017-06-25 ENCOUNTER — Other Ambulatory Visit: Payer: Self-pay | Admitting: Physician Assistant

## 2017-06-25 DIAGNOSIS — E785 Hyperlipidemia, unspecified: Secondary | ICD-10-CM

## 2017-06-26 NOTE — Progress Notes (Signed)
Pt aware of lab results & voiced understanding of those results. Pt reports that she taking 6,000 to 8,000 units of vit D.

## 2017-09-06 ENCOUNTER — Other Ambulatory Visit: Payer: Self-pay

## 2017-09-06 NOTE — Patient Outreach (Signed)
[facility] Care Management  09/06/2017  Susan DiversVictoria Fernandez Nov 10, 1945 [id number]   Medication Adherence call to Mrs.Susan DiversVictoria Loder patient is showing past due under [facility] [employer]on Atorvastatin 80 mg spoke with patient she said she is only taking half of tablet instead of one tablet daily per doctor new instructions,patient ask if we can call doctor's office and have them send in a new prescription for the 40 mg because she has been cutting the 80 mg and she thinks it will be better for her just to do the 40 mg.call doctor's office and left a message for them to call in a new prescription for 40 mg instead of the 80 mg.doctor's office will call back.patient still has a lot of medication and does not need any at this time.   Lillia AbedAna Ollison-Moran CPhT Pharmacy Technician [facility] Care Management Direct Dial [phone]  Fax [phone] [email or web address]

## 2017-11-15 ENCOUNTER — Encounter: Payer: Self-pay | Admitting: Internal Medicine

## 2017-11-20 NOTE — Progress Notes (Deleted)
Complete Physical  Assessment and Plan:  Diagnoses and all orders for this visit:  Annual visit for general adult medical examination with abnormal findings  Essential hypertension -     EKG 12-Lead  CKD (chronic kidney disease) stage 3, GFR 30-59 ml/min (HCC) Increase fluids, avoid NSAIDS, monitor sugars, will monitor -     BASIC METABOLIC PANEL WITH GFR  Combined forms of age-related cataract of both eyes Continue follow up with ophthalmology  Mixed hyperlipidemia Continue medications: atorvastatin Continue low cholesterol diet and exercise.  Check lipid panel.  -     Lipid panel -     TSH  Medication management -     CBC with Differential/Platelet -     BASIC METABOLIC PANEL WITH GFR -     Hepatic function panel  Morbid obesity (HCC) Long discussion about weight loss, diet, and exercise Recommended diet heavy in fruits and veggies and low in animal meats, cheeses, and dairy products, appropriate calorie intake Patient will work on *** Discussed appropriate weight for height and initial goal (***) Follow up at next visit  Prediabetes Discussed disease and risks Discussed diet/exercise, weight management  -     Hemoglobin A1c  Vitamin D deficiency Continue supplementation for goal of 70-100 Check vitamin D level -     VITAMIN D 25 Hydroxy (Vit-D Deficiency, Fractures)  Screening for blood or protein in urine -     Microalbumin / creatinine urine ratio -     Urinalysis w microscopic + reflex cultur  Screening for deficiency anemia -     Vitamin B12 -     Iron,Total/Total Iron Binding Cap    Discussed med's effects and SE's. Screening labs and tests as requested with regular follow-up as recommended. Over 40 minutes of exam, counseling, chart review, and complex, high level critical decision making was performed this visit.   Future Appointments  Date Time Provider Department Center  11/21/2017 10:00 AM Judd Gaudierorbett, Ethan Clayburn, NP [facility] None     HPI  72  y.o. female  presents for a complete physical and follow up for has HTN (hypertension); Hyperlipidemia; Prediabetes; Vitamin D deficiency; Medication management; Combined form of senile cataract; Morbid obesity (HCC); and CKD (chronic kidney disease) stage 3, GFR 30-59 ml/min (HCC) on their problem list.   BMI is There is no height or weight on file to calculate BMI., she {HAS HAS WUJ:81191}OT:18834} been working on diet and exercise. Wt Readings from Last 3 Encounters:  06/21/17 261 lb 3.2 oz (118.5 kg)  01/02/17 266 lb 9.6 oz (120.9 kg)  11/15/16 265 lb (120.2 kg)   Her blood pressure {HAS HAS NOT:18834} been controlled at home, today their BP is   She {DOES_DOES YNW:29562}OT:18564} workout. She denies chest pain, shortness of breath, dizziness.   She is on cholesterol medication (atorvastatin *** ) and denies myalgias. Her cholesterol is not at goal. The cholesterol last visit was:   Lab Results  Component Value Date   CHOL 194 06/21/2017   HDL 53 06/21/2017   LDLCALC 103 (H) 11/15/2016   TRIG 152 (H) 06/21/2017   CHOLHDL 3.7 06/21/2017   She {Has/has not:18111} been working on diet and exercise for prediabetes, she is on bASA, she is on ACE/ARB and denies {Symptoms; diabetes w/o none:19199}. Last A1C in the office was:  Lab Results  Component Value Date   HGBA1C 5.7 (H) 06/21/2017   Last GFR: Lab Results  Component Value Date   GFRNONAA 49 (L) 06/21/2017   Patient is on  Vitamin D supplement but remains below goal at last check:    Lab Results  Component Value Date   VD25OH 52 06/21/2017      Current Medications:  Current Outpatient Medications on File Prior to Visit  Medication Sig Dispense Refill  . aspirin 81 MG tablet Take 81 mg by mouth daily.    Marland Kitchen atorvastatin (LIPITOR) 80 MG tablet TAKE 1/2 TO 1 TABLET BY MOUTH EVERY DAY AS DIRECTED 90 tablet 1  . bisoprolol-hydrochlorothiazide (ZIAC) 5-6.25 MG tablet TAKE 1 TABLET BY MOUTH EVERY DAY 90 tablet 1  . COD LIVER OIL PO Take by mouth  daily.    . fluticasone (FLONASE) 50 MCG/ACT nasal spray Place 2 sprays into both nostrils daily. 16 g 0  . losartan (COZAAR) 50 MG tablet Take 1 tablet (50 mg total) by mouth daily. 90 tablet 3  . Vitamin D, Cholecalciferol, 1000 UNITS CAPS Take 2,000 Units by mouth daily.     No current facility-administered medications on file prior to visit.    Allergies:  Allergies  Allergen Reactions  . Ppd [Tuberculin Purified Protein Derivative]   . Sulfa Antibiotics   . Tolectin [Tolmetin]    Medical History:  She has HTN (hypertension); Hyperlipidemia; Prediabetes; Vitamin D deficiency; Medication management; Combined form of senile cataract; Morbid obesity (HCC); and CKD (chronic kidney disease) stage 3, GFR 30-59 ml/min (HCC) on their problem list. Health Maintenance:    There is no immunization history on file for this patient.  Preventative care: Last colonoscopy: Never had, refuses colonoscopy Mammogram: 2016, overdue PAP:  DEXA:2016  Immunizations: REFUSES ALL  Names of Other Physician/Practitioners you currently use: 1. [facility] here for primary care 2. Dr. Nile Riggs and Dr. Luciana Axe, eye doctor, last visit 2017 3. Dentures , dentist, last done by [facility]  Patient Care Team: [name], MD as PCP - General (Internal Medicine) Luciana Axe Alford Highland, MD as Consulting Physician (Ophthalmology)  Surgical History:  She has a past surgical history that includes Cataract extraction, bilateral (Bilateral, 2018) and Retinal tear repair cryotherapy (Bilateral, 2000). Family History:  Herfamily history is not on file. Social History:  She reports that  has never smoked. she has never used smokeless tobacco.  Review of Systems: Review of Systems  Constitutional: Negative for malaise/fatigue and weight loss.  HENT: Negative for hearing loss and tinnitus.   Eyes: Negative for blurred vision and double vision.  Respiratory: Negative for cough, sputum  production, shortness of breath and wheezing.   Cardiovascular: Negative for chest pain, palpitations, orthopnea, claudication, leg swelling and PND.  Gastrointestinal: Negative for abdominal pain, blood in stool, constipation, diarrhea, heartburn, melena, nausea and vomiting.  Genitourinary: Negative.   Musculoskeletal: Negative for falls, joint pain and myalgias.  Skin: Negative for rash.  Neurological: Negative for dizziness, tingling, sensory change, weakness and headaches.  Endo/Heme/Allergies: Negative for polydipsia.  Psychiatric/Behavioral: Negative.  Negative for depression, memory loss, substance abuse and suicidal ideas. The patient is not nervous/anxious and does not have insomnia.   All other systems reviewed and are negative.   Physical Exam: Estimated body mass index is 40.01 kg/m as calculated from the following:   Height as of 06/21/17: 5' 7.75" (1.721 m).   Weight as of 06/21/17: 261 lb 3.2 oz (118.5 kg). There were no vitals taken for this visit. General Appearance: Well nourished, in no apparent distress.  Eyes: PERRLA, EOMs, conjunctiva no swelling or erythema, normal fundi and vessels.  Sinuses: No Frontal/maxillary tenderness  ENT/Mouth: Ext  aud canals clear, normal light reflex with TMs without erythema, bulging. Good dentition. No erythema, swelling, or exudate on post pharynx. Tonsils not swollen or erythematous. Hearing normal.  Neck: Supple, thyroid normal. No bruits  Respiratory: Respiratory effort normal, BS equal bilaterally without rales, rhonchi, wheezing or stridor.  Cardio: RRR without murmurs, rubs or gallops. Brisk peripheral pulses without edema.  Chest: symmetric, with normal excursions and percussion.  Breasts: Symmetric, without lumps, nipple discharge, retractions.  Abdomen: Soft, nontender, no guarding, rebound, hernias, masses, or organomegaly.  Lymphatics: Non tender without lymphadenopathy.  Genitourinary:  Musculoskeletal: Full ROM all  peripheral extremities,5/5 strength, and normal gait.  Skin: Warm, dry without rashes, lesions, ecchymosis. Neuro: Cranial nerves intact, reflexes equal bilaterally. Normal muscle tone, no cerebellar symptoms. Sensation intact.  Psych: Awake and oriented X 3, normal affect, Insight and Judgment appropriate.   EKG: WNL no ST changes.  Dan Maker 8:37 AM [facility]

## 2017-11-21 ENCOUNTER — Encounter: Payer: Self-pay | Admitting: Adult Health

## 2017-12-12 ENCOUNTER — Other Ambulatory Visit: Payer: Self-pay | Admitting: Internal Medicine

## 2018-04-27 ENCOUNTER — Ambulatory Visit: Payer: Self-pay | Admitting: Internal Medicine

## 2018-04-28 NOTE — Progress Notes (Signed)
       R  E  S  C  H  E  D  U  L  E  D    A  T    A  P  P  O  I  N  T  M  E  N  T       T  I  M  E

## 2018-06-08 ENCOUNTER — Other Ambulatory Visit: Payer: Self-pay | Admitting: Internal Medicine

## 2018-06-13 NOTE — Progress Notes (Signed)
MEDICARE ANNUAL WELLNESS VISIT AND FOLLOW UP Assessment:    Essential hypertension - continue medications, DASH diet, exercise and monitor at home. Call if greater than 130/80.  -     CBC with Differential/Platelet -     COMPLETE METABOLIC PANEL WITH GFR -     TSH  CKD (chronic kidney disease) stage 3, GFR 30-59 ml/min (HCC) Increase fluids, avoid NSAIDS, monitor sugars, will monitor -     COMPLETE METABOLIC PANEL WITH GFR  Mixed hyperlipidemia check lipids decrease fatty foods increase activity.  -     Lipid panel  Prediabetes Discussed disease progression and risks Discussed diet/exercise, weight management and risk modification -     Hemoglobin A1c  Medication management  Vitamin D deficiency Continue supplement  Morbid obesity (HCC) - follow up 3 months for progress monitoring - increase veggies, decrease carbs - long discussion about weight loss, diet, and exercise  Combined forms of age-related cataract of both eyes Follow up eye doctor  Medicare annual wellness visit, subsequent Will get MGM, discussed colonoscopy and patient declines- information given 1 year   Over 30 minutes of exam, counseling, chart review, and critical decision making was performed  No future appointments.   Plan:   During the course of the visit the patient was educated and counseled about appropriate screening and preventive services including:    Pneumococcal vaccine   Influenza vaccine  Prevnar 13  Td vaccine  Screening electrocardiogram  Colorectal cancer screening  Diabetes screening  Glaucoma screening  Nutrition counseling    Subjective:  Susan Fernandez is a 72 y.o. AA female who presents for Medicare Annual Wellness Visit and 3 month follow up for HTN, hyperlipidemia, prediabetes, and vitamin D Def.   Her blood pressure has been controlled at home, today their BP is BP: 138/70 She does workout, walks daily. She denies chest pain, shortness of  breath, dizziness.   BMI is Body mass index is 39.95 kg/m., she is working on diet and exercise. Wt Readings from Last 3 Encounters:  06/15/18 260 lb 12.8 oz (118.3 kg)  06/21/17 261 lb 3.2 oz (118.5 kg)  01/02/17 266 lb 9.6 oz (120.9 kg)    She is not on cholesterol medication and denies myalgias. Her cholesterol is at goal. The cholesterol last visit was:   Lab Results  Component Value Date   CHOL 194 06/21/2017   HDL 53 06/21/2017   LDLCALC 114 (H) 06/21/2017   TRIG 152 (H) 06/21/2017   CHOLHDL 3.7 06/21/2017  She has been taking a whole tablet daily.  She is not having side effects.    She has a history of diet controlled prediabetes.  She is not currently doing a regular exercise routine.  She reports that she has been traveling a lot.  She does not over restrict her diet.     Lab Results  Component Value Date   HGBA1C 5.7 (H) 06/21/2017   Lab Results  Component Value Date   GFRAA 57 (L) 06/21/2017   Patient is on Vitamin D supplement.   Lab Results  Component Value Date   VD25OH 52 06/21/2017      Medication Review: Current Outpatient Medications on File Prior to Visit  Medication Sig Dispense Refill  . aspirin 81 MG tablet Take 81 mg by mouth daily.    Marland Kitchen atorvastatin (LIPITOR) 80 MG tablet TAKE 1/2 TO 1 TABLET BY MOUTH EVERY DAY AS DIRECTED 90 tablet 1  . bisoprolol-hydrochlorothiazide (ZIAC) 5-6.25 MG tablet 1 tablet daily -  to last til 10/4 Office Visit 7 tablet 0  . COD LIVER OIL PO Take by mouth daily.    . fluticasone (FLONASE) 50 MCG/ACT nasal spray Place 2 sprays into both nostrils daily. 16 g 0  . losartan (COZAAR) 50 MG tablet Take 1 tablet (50 mg total) by mouth daily. 90 tablet 3  . Vitamin D, Cholecalciferol, 1000 UNITS CAPS Take 2,000 Units by mouth daily.     No current facility-administered medications on file prior to visit.     Allergies: Allergies  Allergen Reactions  . Ppd [Tuberculin Purified Protein Derivative]   . Sulfa Antibiotics    . Tolectin [Tolmetin]     Current Problems (verified) has HTN (hypertension); Hyperlipidemia; Prediabetes; Vitamin D deficiency; Medication management; Combined form of senile cataract; Morbid obesity (HCC); and CKD (chronic kidney disease) stage 3, GFR 30-59 ml/min (HCC) on their problem list.  Screening Tests  Preventative care: Last colonoscopy: Never had, refuses colonoscopy after my talk Mammogram: 2016, overdue will schedule DEXA:2016  Names of Other Physician/Practitioners you currently use: 1. [facility] here for primary care 2. Dr. Nile Riggs and Dr. Luciana Axe, eye doctor, last visit 2017 3. Dentures , dentist, last done by [facility] Patient Care Team: [name], MD as PCP - General (Internal Medicine) Luciana Axe Alford Highland, MD as Consulting Physician (Ophthalmology)  Surgical: She  has a past surgical history that includes Cataract extraction, bilateral (Bilateral, 2018) and Retinal tear repair cryotherapy (Bilateral, 2000). Family Her family history is not on file. Social history  She reports that she has never smoked. She has never used smokeless tobacco. Her alcohol and drug histories are not on file.  MEDICARE WELLNESS OBJECTIVES: Physical activity: Current Exercise Habits: Home exercise routine, Type of exercise: walking, Intensity: Mild Cardiac risk factors: Cardiac Risk Factors include: advanced age (>79men, >68 women);sedentary lifestyle;obesity (BMI >30kg/m2) Depression/mood screen:   Depression screen [facility] 2/9 06/15/2018  Decreased Interest 0  Down, Depressed, Hopeless 0  PHQ - 2 Score 0    ADLs:  In your present state of health, do you have any difficulty performing the following activities: 06/15/2018  Hearing? N  Vision? N  Difficulty concentrating or making decisions? N  Walking or climbing stairs? N  Dressing or bathing? N  Doing errands, shopping? N  Some recent data might be hidden     Cognitive Testing  Alert? Yes   Normal Appearance?Yes  Oriented to person? Yes  Place? Yes   Time? Yes  Recall of three objects?  Yes  Can perform simple calculations? Yes  Displays appropriate judgment?Yes  Can read the correct time from a watch face?Yes  EOL planning: Does Patient Have a Medical Advance Directive?: Yes Type of Advance Directive: Healthcare Power of Attorney, Living will Copy of Healthcare Power of Attorney in Chart?: No - copy requested   Objective:   Today's Vitals   06/15/18 1023  BP: 138/70  Resp: 16  Temp: 98 F (36.7 C)  Weight: 260 lb 12.8 oz (118.3 kg)  Height: 5' 7.75" (1.721 m)  PainSc: 0-No pain   Body mass index is 39.95 kg/m.  General appearance: alert, no distress, WD/WN, female HEENT: normocephalic, sclerae anicteric, TMs pearly, nares patent, no discharge or erythema, pharynx normal Oral cavity: MMM, no lesions Neck: supple, no lymphadenopathy, no thyromegaly, no masses Heart: RRR, normal S1, S2, no murmurs Lungs: CTA bilaterally, no wheezes, rhonchi, or rales Abdomen: +bs, soft, non tender, non distended, no masses, no hepatomegaly, no splenomegaly Musculoskeletal: nontender,  no swelling, no obvious deformity Extremities: no edema, no cyanosis, no clubbing Pulses: 2+ symmetric, upper and lower extremities, normal cap refill Neurological: alert, oriented x 3, CN2-12 intact, strength normal upper extremities and lower extremities, sensation normal throughout, DTRs 2+ throughout, no cerebellar signs, gait normal Psychiatric: normal affect, behavior normal, pleasant   Medicare Attestation I have personally reviewed: The patient's medical and social history Their use of alcohol, tobacco or illicit drugs Their current medications and supplements The patient's functional ability including ADLs,fall risks, home safety risks, cognitive, and hearing and visual impairment Diet and physical activities Evidence for depression or mood disorders  The patient's weight, height,  BMI, and visual acuity have been recorded in the chart.  I have made referrals, counseling, and provided education to the patient based on review of the above and I have provided the patient with a written personalized care plan for preventive services.     Quentin Mulling, PA-C   06/15/2018

## 2018-06-15 ENCOUNTER — Encounter: Payer: Self-pay | Admitting: Physician Assistant

## 2018-06-15 ENCOUNTER — Ambulatory Visit: Payer: Medicare Other | Admitting: Physician Assistant

## 2018-06-15 VITALS — BP 138/70 | Temp 98.0°F | Resp 16 | Ht 67.75 in | Wt 260.8 lb

## 2018-06-15 DIAGNOSIS — R6889 Other general symptoms and signs: Secondary | ICD-10-CM

## 2018-06-15 DIAGNOSIS — Z79899 Other long term (current) drug therapy: Secondary | ICD-10-CM | POA: Diagnosis not present

## 2018-06-15 DIAGNOSIS — R7303 Prediabetes: Secondary | ICD-10-CM

## 2018-06-15 DIAGNOSIS — N183 Chronic kidney disease, stage 3 unspecified: Secondary | ICD-10-CM

## 2018-06-15 DIAGNOSIS — I10 Essential (primary) hypertension: Secondary | ICD-10-CM

## 2018-06-15 DIAGNOSIS — H25813 Combined forms of age-related cataract, bilateral: Secondary | ICD-10-CM

## 2018-06-15 DIAGNOSIS — Z0001 Encounter for general adult medical examination with abnormal findings: Secondary | ICD-10-CM

## 2018-06-15 DIAGNOSIS — E782 Mixed hyperlipidemia: Secondary | ICD-10-CM

## 2018-06-15 DIAGNOSIS — E559 Vitamin D deficiency, unspecified: Secondary | ICD-10-CM

## 2018-06-15 DIAGNOSIS — Z0000 Encounter for general adult medical examination without abnormal findings: Secondary | ICD-10-CM

## 2018-06-15 NOTE — Patient Instructions (Addendum)
HOW TO SCHEDULE A MAMMOGRAM  [facility] Schedule an appointment by calling [phone].   COLOGUARD INFORMATION   Colon cancer is 3rd most diagnosed cancer and 2nd leading cause of death in both men and women 70 years of age and older despite being one of the most preventable and treatable cancers if found early.  4 of out 5 people diagnosed with colon cancer have NO prior family history.  When caught EARLY 90% of colon cancer is curable.      When it comes to diets, agreement about the perfect plan isn't easy to find, even among the experts. Experts at the [facility] [employer] developed an idea known as the Healthy Eating Plate. Just imagine a plate divided into logical, healthy portions.  The emphasis is on diet quality:  Load up on vegetables and fruits - one-half of your plate: Aim for color and variety, and remember that potatoes don't count.  Go for whole grains - one-quarter of your plate: Whole wheat, barley, wheat berries, quinoa, oats, brown rice, and foods made with them. If you want pasta, go with whole wheat pasta.  Protein power - one-quarter of your plate: Fish, chicken, beans, and nuts are all healthy, versatile protein sources. Limit red meat.  The diet, however, does go beyond the plate, offering a few other suggestions.  Use healthy plant oils, such as olive, canola, soy, corn, sunflower and peanut. Check the labels, and avoid partially hydrogenated oil, which have unhealthy trans fats.  If you're thirsty, drink water. Coffee and tea are good in moderation, but skip sugary drinks and limit milk and dairy products to one or two daily servings.  The type of carbohydrate in the diet is more important than the amount. Some sources of carbohydrates, such as vegetables, fruits, whole grains, and beans-are healthier than others.  Finally, stay active.

## 2018-06-16 LAB — COMPLETE METABOLIC PANEL WITH GFR
AG Ratio: 1.1 (calc) (ref 1.0–2.5)
ALKALINE PHOSPHATASE (APISO): 75 U/L (ref 33–130)
ALT: 8 U/L (ref 6–29)
AST: 17 U/L (ref 10–35)
Albumin: 4.1 g/dL (ref 3.6–5.1)
BUN / CREAT RATIO: 12 (calc) (ref 6–22)
BUN: 13 mg/dL (ref 7–25)
CALCIUM: 9.4 mg/dL (ref 8.6–10.4)
CO2: 28 mmol/L (ref 20–32)
CREATININE: 1.13 mg/dL — AB (ref 0.60–0.93)
Chloride: 100 mmol/L (ref 98–110)
GFR, EST AFRICAN AMERICAN: 57 mL/min/{1.73_m2} — AB (ref >=60)
GFR, EST NON AFRICAN AMERICAN: 49 mL/min/{1.73_m2} — AB (ref >=60)
GLOBULIN: 3.7 g/dL (ref 1.9–3.7)
GLUCOSE: 93 mg/dL (ref 65–99)
Potassium: 4.1 mmol/L (ref 3.5–5.3)
Sodium: 139 mmol/L (ref 135–146)
TOTAL PROTEIN: 7.8 g/dL (ref 6.1–8.1)
Total Bilirubin: 0.4 mg/dL (ref 0.2–1.2)

## 2018-06-16 LAB — CBC WITH DIFFERENTIAL/PLATELET
BASOS ABS: 18 {cells}/uL (ref 0–200)
Basophils Relative: 0.3 %
EOS ABS: 89 {cells}/uL (ref 15–500)
Eosinophils Relative: 1.5 %
HCT: 36.8 % (ref 35.0–45.0)
HEMOGLOBIN: 12.3 g/dL (ref 11.7–15.5)
Lymphs Abs: 2112 cells/uL (ref 850–3900)
MCH: 30.4 pg (ref 27.0–33.0)
MCHC: 33.4 g/dL (ref 32.0–36.0)
MCV: 91.1 fL (ref 80.0–100.0)
MONOS PCT: 6 %
MPV: 10.8 fL (ref 7.5–12.5)
Neutro Abs: 3328 cells/uL (ref 1500–7800)
Neutrophils Relative %: 56.4 %
Platelets: 270 10*3/uL (ref 140–400)
RBC: 4.04 10*6/uL (ref 3.80–5.10)
RDW: 13 % (ref 11.0–15.0)
Total Lymphocyte: 35.8 %
WBC: 5.9 10*3/uL (ref 3.8–10.8)
WBCMIX: 354 {cells}/uL (ref 200–950)

## 2018-06-16 LAB — LIPID PANEL
CHOL/HDL RATIO: 6.9 (calc) — AB (ref <5.0)
Cholesterol: 318 mg/dL — ABNORMAL HIGH (ref <200)
HDL: 46 mg/dL — ABNORMAL LOW (ref >50)
LDL CHOLESTEROL (CALC): 244 mg/dL — AB
Non-HDL Cholesterol (Calc): 272 mg/dL (calc) — ABNORMAL HIGH (ref <130)
TRIGLYCERIDES: 132 mg/dL (ref <150)

## 2018-06-16 LAB — HEMOGLOBIN A1C
Hgb A1c MFr Bld: 5.8 % of total Hgb — ABNORMAL HIGH (ref <5.7)
MEAN PLASMA GLUCOSE: 120 (calc)
eAG (mmol/L): 6.6 (calc)

## 2018-06-16 LAB — TSH: TSH: 1.46 m[IU]/L (ref 0.40–4.50)

## 2018-06-17 MED ORDER — ROSUVASTATIN CALCIUM 20 MG PO TABS: 20.0000 mg | ORAL_TABLET | Freq: Every day | ORAL | 1 refills | Status: DC

## 2018-06-17 NOTE — Addendum Note (Signed)
Addended by: Quentin Mulling R on: 06/17/2018 05:01 PM   Modules accepted: Orders

## 2018-06-25 ENCOUNTER — Other Ambulatory Visit: Payer: Self-pay | Admitting: Physician Assistant

## 2018-06-25 MED ORDER — BISOPROLOL-HYDROCHLOROTHIAZIDE 5-6.25 MG PO TABS: ORAL_TABLET | ORAL | 1 refills | Status: DC

## 2018-08-01 ENCOUNTER — Ambulatory Visit: Payer: Self-pay

## 2018-12-10 ENCOUNTER — Other Ambulatory Visit: Payer: Self-pay

## 2018-12-10 MED ORDER — BISOPROLOL-HYDROCHLOROTHIAZIDE 5-6.25 MG PO TABS: ORAL_TABLET | ORAL | 1 refills | Status: DC

## 2019-02-11 DIAGNOSIS — R7309 Other abnormal glucose: Secondary | ICD-10-CM | POA: Insufficient documentation

## 2019-02-11 NOTE — Progress Notes (Deleted)
MEDICARE ANNUAL WELLNESS VISIT AND FOLLOW UP Assessment:    Essential hypertension - continue medications, DASH diet, exercise and monitor at home. Call if greater than 130/80.  -     CBC with Differential/Platelet -     COMPLETE METABOLIC PANEL WITH GFR -     TSH  CKD (chronic kidney disease) stage 3, GFR 30-59 ml/min (HCC) Increase fluids, avoid NSAIDS, monitor sugars, will monitor -     COMPLETE METABOLIC PANEL WITH GFR  Mixed hyperlipidemia check lipids decrease fatty foods increase activity.  -     Lipid panel  Prediabetes Discussed disease progression and risks Discussed diet/exercise, weight management and risk modification -     Hemoglobin A1c  Medication management  Vitamin D deficiency Continue supplement  Morbid obesity (HCC) - follow up 3 months for progress monitoring - increase veggies, decrease carbs - long discussion about weight loss, diet, and exercise  Combined forms of age-related cataract of both eyes Follow up eye doctor  Medicare annual wellness visit, subsequent Will get MGM, discussed colonoscopy and patient declines- information given 1 year   Over 30 minutes of exam, counseling, chart review, and critical decision making was performed  Future Appointments  Date Time Provider Department Center  02/13/2019 11:00 AM Quentin Mulling, PA-C [facility] None  07/01/2019 11:15 AM Quentin Mulling, PA-C [facility] None  02/17/2020 10:00 AM Quentin Mulling, PA-C [facility] None     Plan:   During the course of the visit the patient was educated and counseled about appropriate screening and preventive services including:    Pneumococcal vaccine   Influenza vaccine  Prevnar 13  Td vaccine  Screening electrocardiogram  Colorectal cancer screening  Diabetes screening  Glaucoma screening  Nutrition counseling    Subjective:  Susan Fernandez is a 73 y.o. AA female who presents for Medicare Annual Wellness Visit and 3 month follow  up for HTN, hyperlipidemia, prediabetes, and vitamin D Def.   Her blood pressure has been controlled at home, today their BP is   She does workout, walks daily. She denies chest pain, shortness of breath, dizziness.   BMI is There is no height or weight on file to calculate BMI., she is working on diet and exercise. Wt Readings from Last 3 Encounters:  06/15/18 260 lb 12.8 oz (118.3 kg)  06/21/17 261 lb 3.2 oz (118.5 kg)  01/02/17 266 lb 9.6 oz (120.9 kg)    She is not on cholesterol medication and denies myalgias. Her cholesterol is at goal. The cholesterol last visit was:   Lab Results  Component Value Date   CHOL 318 (H) 06/15/2018   HDL 46 (L) 06/15/2018   LDLCALC 244 (H) 06/15/2018   TRIG 132 06/15/2018   CHOLHDL 6.9 (H) 06/15/2018  She has been taking a whole tablet daily.  She is not having side effects.    She has a history of diet controlled prediabetes.  She is not currently doing a regular exercise routine.  She reports that she has been traveling a lot.  She does not over restrict her diet.     Lab Results  Component Value Date   HGBA1C 5.8 (H) 06/15/2018   Lab Results  Component Value Date   GFRAA 57 (L) 06/15/2018   Patient is on Vitamin D supplement.   Lab Results  Component Value Date   VD25OH 52 06/21/2017      Medication Review: Current Outpatient Medications on File Prior to Visit  Medication Sig Dispense Refill  . aspirin 81  MG tablet Take 81 mg by mouth daily.    Marland Kitchen. atorvastatin (LIPITOR) 80 MG tablet TAKE 1/2 TO 1 TABLET BY MOUTH EVERY DAY AS DIRECTED 90 tablet 1  . bisoprolol-hydrochlorothiazide (ZIAC) 5-6.25 MG tablet 1 tablet daily 90 tablet 1  . COD LIVER OIL PO Take by mouth daily.    . fluticasone (FLONASE) 50 MCG/ACT nasal spray Place 2 sprays into both nostrils daily. 16 g 0  . losartan (COZAAR) 50 MG tablet Take 1 tablet (50 mg total) by mouth daily. 90 tablet 3  . rosuvastatin (CRESTOR) 20 MG tablet Take 1 tablet (20 mg total) by mouth at  bedtime. 90 tablet 1  . Vitamin D, Cholecalciferol, 1000 UNITS CAPS Take 2,000 Units by mouth daily.     No current facility-administered medications on file prior to visit.     Allergies: Allergies  Allergen Reactions  . Ppd [Tuberculin Purified Protein Derivative]   . Sulfa Antibiotics   . Tolectin [Tolmetin]     Current Problems (verified) has HTN (hypertension); Hyperlipidemia; Prediabetes; Vitamin D deficiency; Medication management; Combined form of senile cataract; Morbid obesity (HCC); and CKD (chronic kidney disease) stage 3, GFR 30-59 ml/min (HCC) on their problem list.  Screening Tests  Preventative care: Last colonoscopy: Never had, refuses colonoscopy after my talk Mammogram: 2016, overdue will schedule DEXA:2016  Names of Other Physician/Practitioners you currently use: 1. [facility] here for primary care 2. Dr. Nile RiggsShapiro and Dr. Luciana Axeankin, eye doctor, last visit 2017 3. Dentures , dentist, [facility] Patient Care Team: Lucky CowboyMcKeown, William, MD as PCP - General (Internal Medicine) Luciana Axeankin, Alford HighlandGary A, MD as Consulting Physician (Ophthalmology)  Surgical: She  has a past surgical history that includes Cataract extraction, bilateral (Bilateral, 2018) and Retinal tear repair cryotherapy (Bilateral, 2000). Family Her family history is not on file. Social history  She reports that she has never smoked. She has never used smokeless tobacco. No history on file for alcohol and drug.  MEDICARE WELLNESS OBJECTIVES: Physical activity:   Cardiac risk factors:   Depression/mood screen:   [facility]  Decreased Interest 0  Down, Depressed, Hopeless 0  PHQ - 2 Score 0    ADLs:  In your present state of health, do you have any difficulty performing the following activities: 06/15/2018  Hearing? N  Vision? N  Difficulty concentrating or making decisions? N  Walking or climbing stairs? N  Dressing or bathing? N   Doing errands, shopping? N  Some recent data might be hidden     Cognitive Testing  Alert? Yes  Normal Appearance?Yes  Oriented to person? Yes  Place? Yes   Time? Yes  Recall of three objects?  Yes  Can perform simple calculations? Yes  Displays appropriate judgment?Yes  Can read the correct time from a watch face?Yes  EOL planning:     Objective:   There were no vitals filed for this visit. There is no height or weight on file to calculate BMI.  General appearance: alert, no distress, WD/WN, female HEENT: normocephalic, sclerae anicteric, TMs pearly, nares patent, no discharge or erythema, pharynx normal Oral cavity: MMM, no lesions Neck: supple, no lymphadenopathy, no thyromegaly, no masses Heart: RRR, normal S1, S2, no murmurs Lungs: CTA bilaterally, no wheezes, rhonchi, or rales Abdomen: +bs, soft, non tender, non distended, no masses, no hepatomegaly, no splenomegaly Musculoskeletal: nontender, no swelling, no obvious deformity Extremities: no edema, no cyanosis, no clubbing Pulses: 2+ symmetric, upper and lower extremities,  normal cap refill Neurological: alert, oriented x 3, CN2-12 intact, strength normal upper extremities and lower extremities, sensation normal throughout, DTRs 2+ throughout, no cerebellar signs, gait normal Psychiatric: normal affect, behavior normal, pleasant   Medicare Attestation I have personally reviewed: The patient's medical and social history Their use of alcohol, tobacco or illicit drugs Their current medications and supplements The patient's functional ability including ADLs,fall risks, home safety risks, cognitive, and hearing and visual impairment Diet and physical activities Evidence for depression or mood disorders  The patient's weight, height, BMI, and visual acuity have been recorded in the chart.  I have made referrals, counseling, and provided education to the patient based on review of the above and I have provided the patient  with a written personalized care plan for preventive services.     Quentin Mulling, PA-C   02/11/2019

## 2019-02-13 ENCOUNTER — Encounter: Payer: Medicare Other | Admitting: Physician Assistant

## 2019-03-11 ENCOUNTER — Encounter: Payer: Medicare Other | Admitting: Physician Assistant

## 2019-06-06 ENCOUNTER — Other Ambulatory Visit: Payer: Self-pay | Admitting: Physician Assistant

## 2019-06-28 NOTE — Progress Notes (Deleted)
MEDICARE ANNUAL WELLNESS VISIT AND FOLLOW UP Assessment:    Essential hypertension - continue medications, DASH diet, exercise and monitor at home. Call if greater than 130/80.  -     CBC with Differential/Platelet -     COMPLETE METABOLIC PANEL WITH GFR -     TSH  CKD (chronic kidney disease) stage 3, GFR 30-59 ml/min (HCC) Increase fluids, avoid NSAIDS, monitor sugars, will monitor -     COMPLETE METABOLIC PANEL WITH GFR  Mixed hyperlipidemia check lipids decrease fatty foods increase activity.  -     Lipid panel  Prediabetes Discussed disease progression and risks Discussed diet/exercise, weight management and risk modification -     Hemoglobin A1c  Medication management  Vitamin D deficiency Continue supplement  Morbid obesity ([place]) - follow up 3 months for progress monitoring - increase veggies, decrease carbs - long discussion about weight loss, diet, and exercise  Combined forms of age-related cataract of both eyes Follow up eye doctor  Medicare annual wellness visit, subsequent Will get MGM, discussed colonoscopy and patient declines- information given 1 year   Over 30 minutes of exam, counseling, chart review, and critical decision making was performed  Future Appointments  Date Time Provider [place]  07/01/2019 11:15 AM Vicie Mutters, PA-C [facility] None     Plan:   During the course of the visit the patient was educated and counseled about appropriate screening and preventive services including:    Pneumococcal vaccine   Influenza vaccine  Prevnar 13  Td vaccine  Screening electrocardiogram  Colorectal cancer screening  Diabetes screening  Glaucoma screening  Nutrition counseling    Subjective:  Susan Fernandez is a 73 y.o. AA female who presents for Medicare Annual Wellness Visit and 3 month follow up for HTN, hyperlipidemia, prediabetes, and vitamin D Def.   Her blood pressure has been controlled at home,  today their BP is   She does workout, walks daily. She denies chest pain, shortness of breath, dizziness.   BMI is There is no height or weight on file to calculate BMI., she is working on diet and exercise. Wt Readings from Last 3 Encounters:  06/15/18 260 lb 12.8 oz (118.3 kg)  06/21/17 261 lb 3.2 oz (118.5 kg)  01/02/17 266 lb 9.6 oz (120.9 kg)    She is not on cholesterol medication and denies myalgias. Her cholesterol is at goal. The cholesterol last visit was:   Lab Results  Component Value Date   CHOL 318 (H) 06/15/2018   HDL 46 (L) 06/15/2018   LDLCALC 244 (H) 06/15/2018   TRIG 132 06/15/2018   CHOLHDL 6.9 (H) 06/15/2018  She has been taking a whole tablet daily.  She is not having side effects.    She has a history of diet controlled prediabetes.  She is not currently doing a regular exercise routine.  She reports that she has been traveling a lot.  She does not over restrict her diet.     Lab Results  Component Value Date   HGBA1C 5.8 (H) 06/15/2018   Lab Results  Component Value Date   GFRAA 57 (L) 06/15/2018   Patient is on Vitamin D supplement.   Lab Results  Component Value Date   VD25OH 52 06/21/2017      Medication Review: Current Outpatient Medications on File Prior to Visit  Medication Sig Dispense Refill  . aspirin 81 MG tablet Take 81 mg by mouth daily.    Marland Kitchen atorvastatin (LIPITOR) 80 MG tablet TAKE  1/2 TO 1 TABLET BY MOUTH EVERY DAY AS DIRECTED 90 tablet 1  . bisoprolol-hydrochlorothiazide (ZIAC) 5-6.25 MG tablet Take 1 tablet Daily for BP 90 tablet 3  . COD LIVER OIL PO Take by mouth daily.    . fluticasone (FLONASE) 50 MCG/ACT nasal spray Place 2 sprays into both nostrils daily. 16 g 0  . losartan (COZAAR) 50 MG tablet Take 1 tablet (50 mg total) by mouth daily. 90 tablet 3  . rosuvastatin (CRESTOR) 20 MG tablet Take 1 tablet (20 mg total) by mouth at bedtime. 90 tablet 1  . Vitamin D, Cholecalciferol, 1000 UNITS CAPS Take 2,000 Units by mouth  daily.     No current facility-administered medications on file prior to visit.     Allergies: Allergies  Allergen Reactions  . Ppd [Tuberculin Purified Protein Derivative]   . Sulfa Antibiotics   . Tolectin [Tolmetin]     Current Problems (verified) has HTN (hypertension); Hyperlipidemia; Vitamin D deficiency; Medication management; Combined form of senile cataract; Morbid obesity (HCC); CKD (chronic kidney disease) stage 3, GFR 30-59 ml/min; and Abnormal glucose on their problem list.  Screening Tests  Preventative care: Last colonoscopy: Never had, refuses colonoscopy after my talk Mammogram: 2016, overdue will schedule DEXA:2016  Names of Other Physician/Practitioners you currently use: 1. [facility] here for primary care 2. Dr. Nile RiggsShapiro and Dr. Luciana Axeankin, eye doctor, last visit 2017 3. [facility] , [facility], [facility] Patient Care Team: Lucky CowboyMcKeown, William, MD as PCP - General (Internal Medicine) Luciana Axeankin, Alford HighlandGary A, MD as Consulting Physician (Ophthalmology)  Surgical: She  has a past surgical history that includes Cataract extraction, bilateral (Bilateral, 2018) and Retinal tear repair cryotherapy (Bilateral, 2000). Family Her family history is not on file. Social history  She reports that she has never smoked. She has never used smokeless tobacco. No history on file for alcohol and drug.  MEDICARE WELLNESS OBJECTIVES: Physical activity:   Cardiac risk factors:   Depression/mood screen:   Depression screen [facility] 06/15/2018  Decreased Interest 0  Down, Depressed, Hopeless 0  PHQ - 2 Score 0    ADLs:  No flowsheet data found.   Cognitive Testing  Alert? Yes  Normal Appearance?Yes  Oriented to person? Yes  Place? Yes   Time? Yes  Recall of three objects?  Yes  Can perform simple calculations? Yes  Displays appropriate judgment?Yes  Can read the correct time from a watch face?Yes  EOL planning:     Objective:   There  were no vitals filed for this visit. There is no height or weight on file to calculate BMI.  General appearance: alert, no distress, WD/WN, female HEENT: normocephalic, sclerae anicteric, TMs pearly, nares patent, no discharge or erythema, pharynx normal Oral cavity: MMM, no lesions Neck: supple, no lymphadenopathy, no thyromegaly, no masses Heart: RRR, normal S1, S2, no murmurs Lungs: CTA bilaterally, no wheezes, rhonchi, or rales Abdomen: +bs, soft, non tender, non distended, no masses, no hepatomegaly, no splenomegaly Musculoskeletal: nontender, no swelling, no obvious deformity Extremities: no edema, no cyanosis, no clubbing Pulses: 2+ symmetric, upper and lower extremities, normal cap refill Neurological: alert, oriented x 3, CN2-12 intact, strength normal upper extremities and lower extremities, sensation normal throughout, DTRs 2+ throughout, no cerebellar signs, gait normal Psychiatric: normal affect, behavior normal, pleasant   Medicare Attestation I have personally reviewed: The patient's medical and social history Their use of alcohol, tobacco or illicit drugs Their current medications and supplements The patient's functional ability  including ADLs,fall risks, home safety risks, cognitive, and hearing and visual impairment Diet and physical activities Evidence for depression or mood disorders  The patient's weight, height, BMI, and visual acuity have been recorded in the chart.  I have made referrals, counseling, and provided education to the patient based on review of the above and I have provided the patient with a written personalized care plan for preventive services.     Quentin Mulling, PA-C   06/28/2019

## 2019-07-01 ENCOUNTER — Ambulatory Visit: Payer: Self-pay | Admitting: Physician Assistant

## 2019-11-03 ENCOUNTER — Emergency Department (HOSPITAL_COMMUNITY): Payer: Medicare PPO

## 2019-11-03 ENCOUNTER — Encounter (HOSPITAL_COMMUNITY): Payer: Self-pay

## 2019-11-03 ENCOUNTER — Other Ambulatory Visit: Payer: Self-pay

## 2019-11-03 ENCOUNTER — Inpatient Hospital Stay (HOSPITAL_COMMUNITY)
Admission: EM | Admit: 2019-11-03 | Discharge: 2019-11-05 | DRG: 065 | Disposition: A | Discharge status: Home Health | Payer: Medicare PPO | Attending: Internal Medicine | Admitting: Internal Medicine

## 2019-11-03 DIAGNOSIS — E1122 Type 2 diabetes mellitus with diabetic chronic kidney disease: Secondary | ICD-10-CM | POA: Diagnosis present

## 2019-11-03 DIAGNOSIS — Z887 Allergy status to serum and vaccine status: Secondary | ICD-10-CM | POA: Diagnosis not present

## 2019-11-03 DIAGNOSIS — Z79899 Other long term (current) drug therapy: Secondary | ICD-10-CM

## 2019-11-03 DIAGNOSIS — M25552 Pain in left hip: Secondary | ICD-10-CM | POA: Diagnosis present

## 2019-11-03 DIAGNOSIS — M79604 Pain in right leg: Secondary | ICD-10-CM | POA: Diagnosis present

## 2019-11-03 DIAGNOSIS — I6381 Other cerebral infarction due to occlusion or stenosis of small artery: Principal | ICD-10-CM | POA: Diagnosis present

## 2019-11-03 DIAGNOSIS — M79605 Pain in left leg: Secondary | ICD-10-CM | POA: Diagnosis present

## 2019-11-03 DIAGNOSIS — R42 Dizziness and giddiness: Secondary | ICD-10-CM | POA: Diagnosis not present

## 2019-11-03 DIAGNOSIS — Z882 Allergy status to sulfonamides status: Secondary | ICD-10-CM

## 2019-11-03 DIAGNOSIS — R29701 NIHSS score 1: Secondary | ICD-10-CM | POA: Diagnosis present

## 2019-11-03 DIAGNOSIS — I499 Cardiac arrhythmia, unspecified: Secondary | ICD-10-CM | POA: Diagnosis not present

## 2019-11-03 DIAGNOSIS — M25551 Pain in right hip: Secondary | ICD-10-CM | POA: Diagnosis present

## 2019-11-03 DIAGNOSIS — N1831 Chronic kidney disease, stage 3a: Secondary | ICD-10-CM

## 2019-11-03 DIAGNOSIS — I959 Hypotension, unspecified: Secondary | ICD-10-CM | POA: Diagnosis not present

## 2019-11-03 DIAGNOSIS — I639 Cerebral infarction, unspecified: Secondary | ICD-10-CM

## 2019-11-03 DIAGNOSIS — S8992XA Unspecified injury of left lower leg, initial encounter: Secondary | ICD-10-CM | POA: Diagnosis not present

## 2019-11-03 DIAGNOSIS — R52 Pain, unspecified: Secondary | ICD-10-CM

## 2019-11-03 DIAGNOSIS — N183 Chronic kidney disease, stage 3 unspecified: Secondary | ICD-10-CM | POA: Diagnosis present

## 2019-11-03 DIAGNOSIS — M25562 Pain in left knee: Secondary | ICD-10-CM | POA: Diagnosis present

## 2019-11-03 DIAGNOSIS — E782 Mixed hyperlipidemia: Secondary | ICD-10-CM | POA: Diagnosis not present

## 2019-11-03 DIAGNOSIS — K219 Gastro-esophageal reflux disease without esophagitis: Secondary | ICD-10-CM | POA: Diagnosis present

## 2019-11-03 DIAGNOSIS — E1151 Type 2 diabetes mellitus with diabetic peripheral angiopathy without gangrene: Secondary | ICD-10-CM | POA: Diagnosis present

## 2019-11-03 DIAGNOSIS — I10 Essential (primary) hypertension: Secondary | ICD-10-CM

## 2019-11-03 DIAGNOSIS — Z6834 Body mass index (BMI) 34.0-34.9, adult: Secondary | ICD-10-CM | POA: Diagnosis not present

## 2019-11-03 DIAGNOSIS — R29810 Facial weakness: Secondary | ICD-10-CM | POA: Diagnosis present

## 2019-11-03 DIAGNOSIS — E669 Obesity, unspecified: Secondary | ICD-10-CM | POA: Diagnosis present

## 2019-11-03 DIAGNOSIS — E871 Hypo-osmolality and hyponatremia: Secondary | ICD-10-CM | POA: Diagnosis present

## 2019-11-03 DIAGNOSIS — Z9181 History of falling: Secondary | ICD-10-CM | POA: Diagnosis not present

## 2019-11-03 DIAGNOSIS — Z20822 Contact with and (suspected) exposure to covid-19: Secondary | ICD-10-CM | POA: Diagnosis present

## 2019-11-03 DIAGNOSIS — N390 Urinary tract infection, site not specified: Secondary | ICD-10-CM | POA: Diagnosis present

## 2019-11-03 DIAGNOSIS — R001 Bradycardia, unspecified: Secondary | ICD-10-CM

## 2019-11-03 DIAGNOSIS — G8929 Other chronic pain: Secondary | ICD-10-CM | POA: Diagnosis present

## 2019-11-03 DIAGNOSIS — E785 Hyperlipidemia, unspecified: Secondary | ICD-10-CM | POA: Diagnosis present

## 2019-11-03 DIAGNOSIS — I129 Hypertensive chronic kidney disease with stage 1 through stage 4 chronic kidney disease, or unspecified chronic kidney disease: Secondary | ICD-10-CM | POA: Diagnosis present

## 2019-11-03 DIAGNOSIS — Z888 Allergy status to other drugs, medicaments and biological substances status: Secondary | ICD-10-CM

## 2019-11-03 DIAGNOSIS — I6389 Other cerebral infarction: Secondary | ICD-10-CM | POA: Diagnosis not present

## 2019-11-03 DIAGNOSIS — R0902 Hypoxemia: Secondary | ICD-10-CM | POA: Diagnosis not present

## 2019-11-03 DIAGNOSIS — Z03818 Encounter for observation for suspected exposure to other biological agents ruled out: Secondary | ICD-10-CM | POA: Diagnosis not present

## 2019-11-03 DIAGNOSIS — I63233 Cerebral infarction due to unspecified occlusion or stenosis of bilateral carotid arteries: Secondary | ICD-10-CM | POA: Diagnosis not present

## 2019-11-03 HISTORY — DX: Other cerebral infarction due to occlusion or stenosis of small artery: I63.81

## 2019-11-03 HISTORY — DX: Cerebral infarction, unspecified: I63.9

## 2019-11-03 LAB — CBC WITH DIFFERENTIAL/PLATELET
Abs Immature Granulocytes: 0.01 10*3/uL (ref 0.00–0.07)
Basophils Absolute: 0 10*3/uL (ref 0.0–0.1)
Basophils Relative: 0 %
Eosinophils Absolute: 0.1 10*3/uL (ref 0.0–0.5)
Eosinophils Relative: 1 %
HCT: 39.8 % (ref 36.0–46.0)
Hemoglobin: 12.5 g/dL (ref 12.0–15.0)
Immature Granulocytes: 0 %
Lymphocytes Relative: 25 %
Lymphs Abs: 1.3 10*3/uL (ref 0.7–4.0)
MCH: 31.6 pg (ref 26.0–34.0)
MCHC: 31.4 g/dL (ref 30.0–36.0)
MCV: 100.8 fL — ABNORMAL HIGH (ref 80.0–100.0)
Monocytes Absolute: 0.4 10*3/uL (ref 0.1–1.0)
Monocytes Relative: 7 %
Neutro Abs: 3.4 10*3/uL (ref 1.7–7.7)
Neutrophils Relative %: 67 %
Platelets: 249 10*3/uL (ref 150–400)
RBC: 3.95 MIL/uL (ref 3.87–5.11)
RDW: 12.8 % (ref 11.5–15.5)
WBC: 5.2 10*3/uL (ref 4.0–10.5)
nRBC: 0 % (ref 0.0–0.2)

## 2019-11-03 LAB — I-STAT CHEM 8, ED
BUN: 11 mg/dL (ref 8–23)
BUN: 14 mg/dL (ref 8–23)
Calcium, Ion: 0.92 mmol/L — ABNORMAL LOW (ref 1.15–1.40)
Calcium, Ion: 1.06 mmol/L — ABNORMAL LOW (ref 1.15–1.40)
Chloride: 101 mmol/L (ref 98–111)
Chloride: 105 mmol/L (ref 98–111)
Creatinine, Ser: 1.2 mg/dL — ABNORMAL HIGH (ref 0.44–1.00)
Creatinine, Ser: 1.3 mg/dL — ABNORMAL HIGH (ref 0.44–1.00)
Glucose, Bld: 105 mg/dL — ABNORMAL HIGH (ref 70–99)
Glucose, Bld: 109 mg/dL — ABNORMAL HIGH (ref 70–99)
HCT: 38 % (ref 36.0–46.0)
HCT: 38 % (ref 36.0–46.0)
Hemoglobin: 12.9 g/dL (ref 12.0–15.0)
Hemoglobin: 12.9 g/dL (ref 12.0–15.0)
Potassium: 4.1 mmol/L (ref 3.5–5.1)
Potassium: 6.8 mmol/L (ref 3.5–5.1)
Sodium: 131 mmol/L — ABNORMAL LOW (ref 135–145)
Sodium: 136 mmol/L (ref 135–145)
TCO2: 26 mmol/L (ref 22–32)
TCO2: 27 mmol/L (ref 22–32)

## 2019-11-03 LAB — DIFFERENTIAL

## 2019-11-03 LAB — URINALYSIS, ROUTINE W REFLEX MICROSCOPIC
Bilirubin Urine: NEGATIVE
Glucose, UA: NEGATIVE mg/dL
Ketones, ur: NEGATIVE mg/dL
Nitrite: NEGATIVE
Protein, ur: NEGATIVE mg/dL
Specific Gravity, Urine: 1.01 (ref 1.005–1.030)
pH: 5.5 (ref 5.0–8.0)

## 2019-11-03 LAB — ETHANOL
Alcohol, Ethyl (B): <10 mg/dL (ref <10)
Alcohol, Ethyl (B): <10 mg/dL (ref <10)

## 2019-11-03 LAB — COMPREHENSIVE METABOLIC PANEL
ALT: 12 U/L (ref 0–44)
AST: 17 U/L (ref 15–41)
Albumin: 3.1 g/dL — ABNORMAL LOW (ref 3.5–5.0)
Alkaline Phosphatase: 64 U/L (ref 38–126)
Anion gap: 10 (ref 5–15)
BUN: 10 mg/dL (ref 8–23)
CO2: 24 mmol/L (ref 22–32)
Calcium: 8.9 mg/dL (ref 8.9–10.3)
Chloride: 101 mmol/L (ref 98–111)
Creatinine, Ser: 1.29 mg/dL — ABNORMAL HIGH (ref 0.44–1.00)
GFR calc Af Amer: 48 mL/min — ABNORMAL LOW (ref >60)
GFR calc non Af Amer: 42 mL/min — ABNORMAL LOW (ref >60)
Glucose, Bld: 108 mg/dL — ABNORMAL HIGH (ref 70–99)
Potassium: 4.2 mmol/L (ref 3.5–5.1)
Sodium: 135 mmol/L (ref 135–145)
Total Bilirubin: 0.5 mg/dL (ref 0.3–1.2)
Total Protein: 7.3 g/dL (ref 6.5–8.1)

## 2019-11-03 LAB — CBC

## 2019-11-03 LAB — URINALYSIS, MICROSCOPIC (REFLEX)

## 2019-11-03 LAB — RAPID URINE DRUG SCREEN, HOSP PERFORMED
Amphetamines: NOT DETECTED
Barbiturates: NOT DETECTED
Benzodiazepines: NOT DETECTED
Cocaine: NOT DETECTED
Opiates: NOT DETECTED
Tetrahydrocannabinol: NOT DETECTED

## 2019-11-03 LAB — HEMOGLOBIN A1C
Hgb A1c MFr Bld: 5.9 % — ABNORMAL HIGH (ref 4.8–5.6)
Mean Plasma Glucose: 122.63 mg/dL

## 2019-11-03 LAB — SARS CORONAVIRUS 2 (TAT 6-24 HRS): SARS Coronavirus 2: NEGATIVE

## 2019-11-03 LAB — PROTIME-INR
INR: 1.1 (ref 0.8–1.2)
Prothrombin Time: 13.6 seconds (ref 11.4–15.2)

## 2019-11-03 LAB — LIPID PANEL
Cholesterol: 292 mg/dL — ABNORMAL HIGH (ref 0–200)
HDL: 40 mg/dL — ABNORMAL LOW (ref >40)
LDL Cholesterol: 230 mg/dL — ABNORMAL HIGH (ref 0–99)
Total CHOL/HDL Ratio: 7.3 RATIO
Triglycerides: 111 mg/dL (ref <150)
VLDL: 22 mg/dL (ref 0–40)

## 2019-11-03 LAB — CBG MONITORING, ED: Glucose-Capillary: 98 mg/dL (ref 70–99)

## 2019-11-03 LAB — APTT: aPTT: 21 seconds — ABNORMAL LOW (ref 24–36)

## 2019-11-03 MED ORDER — STROKE: EARLY STAGES OF RECOVERY BOOK
Freq: Once | Status: AC
  Filled 2019-11-03: qty 1

## 2019-11-03 MED ORDER — ACETAMINOPHEN 650 MG RE SUPP: 650.0000 mg | RECTAL | Status: DC | PRN

## 2019-11-03 MED ORDER — CLOPIDOGREL BISULFATE 75 MG PO TABS
75.0000 mg | ORAL_TABLET | Freq: Every day | ORAL | Status: DC
  Administered 2019-11-04 – 2019-11-05 (×2): 75 mg via ORAL
  Filled 2019-11-03 (×2): qty 1

## 2019-11-03 MED ORDER — ACETAMINOPHEN 325 MG PO TABS
650.0000 mg | ORAL_TABLET | ORAL | Status: DC | PRN
  Filled 2019-11-03 (×2): qty 2

## 2019-11-03 MED ORDER — AMLODIPINE BESYLATE 5 MG PO TABS
5.0000 mg | ORAL_TABLET | Freq: Every day | ORAL | Status: DC
  Administered 2019-11-03 – 2019-11-05 (×2): 5 mg via ORAL
  Filled 2019-11-03 (×3): qty 1

## 2019-11-03 MED ORDER — CLOPIDOGREL BISULFATE 300 MG PO TABS
300.0000 mg | ORAL_TABLET | Freq: Once | ORAL | Status: AC
  Administered 2019-11-03: 19:00:00 300 mg via ORAL
  Filled 2019-11-03: qty 1

## 2019-11-03 MED ORDER — ACETAMINOPHEN 160 MG/5ML PO SOLN: 650.0000 mg | ORAL | Status: DC | PRN

## 2019-11-03 MED ORDER — ATORVASTATIN CALCIUM 40 MG PO TABS: 40.0000 mg | ORAL_TABLET | Freq: Every day | ORAL | Status: DC

## 2019-11-03 MED ORDER — SENNOSIDES-DOCUSATE SODIUM 8.6-50 MG PO TABS: 1.0000 | ORAL_TABLET | Freq: Every evening | ORAL | Status: DC | PRN

## 2019-11-03 MED ORDER — ENOXAPARIN SODIUM 40 MG/0.4ML ~~LOC~~ SOLN
40.0000 mg | SUBCUTANEOUS | Status: DC
  Administered 2019-11-03 – 2019-11-04 (×2): 40 mg via SUBCUTANEOUS
  Filled 2019-11-03 (×2): qty 0.4

## 2019-11-03 NOTE — ED Notes (Signed)
Patient transported to CT 

## 2019-11-03 NOTE — Progress Notes (Signed)
Pt admitted from the ED with stroke diagnosis, pt alert and oriented, denies any pain at this time, settled in bed with call light at bedside, tele monitor put and verified on pt, safety concern addressed accordingly, was however reassured and will continue to monitor, v/s stable. Ob[place]-Asidi, Emerald Shor Efe

## 2019-11-03 NOTE — ED Notes (Signed)
Blood hemolyzed per lab. New labs drawn and labs reordered.

## 2019-11-03 NOTE — ED Triage Notes (Signed)
Pt bib gcems w/ c/o of dizziness. Says she woke up this AM and was feeling dizzy. EMS EKG showed bradycardia w/ HR in the 40's. Pt AOx4 w/ no known cardiac hx.  BP 118/70 SPO2 95% on RA RR 20 CBG 122

## 2019-11-03 NOTE — ED Notes (Signed)
Pt returned from MRI °

## 2019-11-03 NOTE — ED Notes (Signed)
Dr. Kirkpatrick in to assess pt at this time 

## 2019-11-03 NOTE — Consult Note (Signed)
Neurology Consultation Reason for Consult: Stroke Referring Physician: Darlyn Chamber  CC: Stroke  History is obtained from: Patient  HPI: Susan Fernandez is a 74 y.o. female with a history of hyperlipidemia, hypertension, prediabetes who presents with dizziness and vision change that was present on awakening.  She states that she has felt slightly unsteady and that everything just looks kind of blurry.  When she covers one eye it does improve, but does not come back to normal.  Because this had been persistent throughout the day she presented to the emergency department where an MRI was performed which reveals a right thalamic infarct.   LKW: 2/20 prior to bed tpa given?: no, out of window    ROS: A 14 point ROS was performed and is negative except as noted in the HPI.   Past Medical History:  Diagnosis Date  . Cataract   . GERD (gastroesophageal reflux disease)   . Hyperlipidemia   . Hypertension 1995  . IBS (irritable bowel syndrome)   . Morbid obesity (HCC)   . 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 Posterior vitreous detachment 10/29/2015  . Prediabetes 2016  . Venous insufficiency      History reviewed. No pertinent family history.   Social History:  reports that she has never smoked. She has never used smokeless tobacco. No history on file for alcohol and drug.   Exam: Current vital signs: BP 126/69   Pulse 85   Temp (!) 97.5 F (36.4 C) (Oral)   Resp 18   SpO2 97%  Vital signs in last 24 hours: Temp:  [97.5 F (36.4 C)] 97.5 F (36.4 C) (02/21 1123) Pulse Rate:  [44-128] 85 (02/21 1630) Resp:  [14-20] 18 (02/21 1630) BP: (92-136)/(54-71) 126/69 (02/21 1630) SpO2:  [92 %-100 %] 97 % (02/21 1630)   Physical Exam  Constitutional: Appears well-developed and well-nourished.  Psych: Affect appropriate to situation Eyes: No scleral injection HENT: No OP obstrucion MSK: no joint deformities.  Cardiovascular: Normal rate and regular rhythm.   Respiratory: Effort normal, non-labored breathing GI: Soft.  No distension. There is no tenderness.  Skin: WDI  Neuro: Mental Status: Patient is awake, alert, oriented to person, place, month, year, and situation. Patient is able to give a clear and coherent history. No signs of aphasia or neglect Cranial Nerves: II: Visual Fields are full. Pupils are equal, round, and reactive to light.   III,IV, VI: EOMI without ptosis or diploplia.  V: Facial sensation is symmetric to temperature VII: Facial movement with mild left facial weakness VIII: hearing is intact to voice X: Uvula elevates symmetrically XI: Shoulder shrug is symmetric. XII: tongue is midline without atrophy or fasciculations.  Motor: Tone is normal. Bulk is normal. 5/5 strength was present in all four extremities.  Sensory: Sensation is symmetric to light touch and temperature in the arms and legs.  She does not extinguish Cerebellar: FNF intact on the right, slight discoordination on the left without past-pointing   I have reviewed labs in epic and the results pertinent to this consultation are: CMP-creatinine 1.29  I have reviewed the images obtained: MRI brain-small thalamic infarct  Impression: 74 year old female with a history of hypertension, hyperlipidemia, diabetes who presents with what is very likely a small vessel infarct.  She is being admitted for secondary risk factor modification and therapy evaluations.  Recommendations: - HgbA1c, fasting lipid panel - Frequent neuro checks - Echocardiogram - Carotid dopplers - Prophylactic therapy-Antiplatelet med: Aspirin -81 mg daily and Plavix 75 mg  daily following.  Milligrams load - Risk factor modification - Telemetry monitoring - PT consult, OT consult, Speech consult - Stroke team to follow    Roland Rack, MD [facility] [phone]  If 7pm- 7am, please page neurology on call as listed in [place].

## 2019-11-03 NOTE — ED Provider Notes (Signed)
[name] EMERGENCY DEPARTMENT Provider Note   CSN: [id number] Arrival date & time: 11/03/19  1109     History Chief Complaint  Patient presents with  . Dizziness    Susan Fernandez is a 74 y.o. female.  Patient brought in by EMS.  Patient awoke this morning with significant dizziness but no room spinning.  And visual changes that sound like they were double vision.  Because she closed one eye she could see Jeronimo Norma closely otherwise she can see fine.  Patient states that she was fine at 9:30 PM last night.  Awoke this morning at 6 AM.  Had so much trouble walking that she had to hang onto things.  But denies any blackening out of the vision or any weakness to her arms or legs.  Past medical history significant for hyperlipidemia hypertension prediabetes posterior vitreous detachment.  Patient says she recently had cataract surgery.  Patient does relate though that for the last year she has had some dizziness issues not as severe as today.  Says she had a syncopal episode and she hit her head.  But never got any follow-up for this.  Patient has not been seen recently has not had a recent CT of her head.  She is scared to get evaluated by her primary care doctor or the emergency department due to the Covid pandemic.  Patient walking with so much of a problem normally she is able to walk on her own that she had to call her friend.  Her friend insisted that she come in to get evaluated.  Patient denies any fevers or upper respiratory symptoms.  Denies any headache neck pain back pain.  No nausea no vomiting.        Past Medical History:  Diagnosis Date  . Cataract   . GERD (gastroesophageal reflux disease)   . Hyperlipidemia   . Hypertension 1995  . IBS (irritable bowel syndrome)   . Morbid obesity (HCC)   . 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 Posterior vitreous detachment 10/29/2015  . Prediabetes 2016  . Venous insufficiency     Patient Active Problem List     Diagnosis Date Noted  . Abnormal glucose 02/11/2019  . CKD (chronic kidney disease) stage 3, GFR 30-59 ml/min 06/21/2017  . Morbid obesity (HCC) 06/20/2017  . HTN (hypertension) 11/03/2015  . Hyperlipidemia 11/03/2015  . Vitamin D deficiency 11/03/2015  . Medication management 11/03/2015  . Combined form of senile cataract 10/29/2015    Past Surgical History:  Procedure Laterality Date  . CATARACT EXTRACTION, BILATERAL Bilateral 2018   L- 01/2017, R-02/2017  . RETINAL TEAR REPAIR CRYOTHERAPY Bilateral 2000   Dr. Cecilie Kicks     OB History   No obstetric history on file.     History reviewed. No pertinent family history.  Social History   Tobacco Use  . Smoking status: Never Smoker  . Smokeless tobacco: Never Used  Substance Use Topics  . Alcohol use: Not on file  . Drug use: Not on file    Home Medications Prior to Admission medications   Medication Sig Start Date End Date Taking? Authorizing Provider  bisoprolol-hydrochlorothiazide (ZIAC) 5-6.25 MG tablet Take 1 tablet Daily for BP Patient taking differently: Take 1 tablet by mouth daily.  06/06/19  Yes [name], MD  fluticasone [facility]) 50 MCG/ACT nasal spray Place 2 sprays into both nostrils daily. Patient taking differently: Place 2 sprays into both nostrils daily as needed.  12/22/15  Yes Shirleen Schirmer, PA-C  atorvastatin (LIPITOR) 80 MG tablet TAKE 1/2 TO 1 TABLET BY MOUTH EVERY DAY AS DIRECTED Patient not taking: Reported on 11/03/2019 06/25/17   Unk Pinto, MD  losartan (COZAAR) 50 MG tablet Take 1 tablet (50 mg total) by mouth daily. Patient not taking: Reported on 11/03/2019 06/21/17 06/21/18  Liane Comber, NP  rosuvastatin (CRESTOR) 20 MG tablet Take 1 tablet (20 mg total) by mouth at bedtime. Patient not taking: Reported on 11/03/2019 06/17/18   Vicie Mutters, PA-C    Allergies    Ppd [tuberculin purified protein derivative], Sulfa antibiotics, and Tolectin [tolmetin]  Review of Systems    Review of Systems  Constitutional: Negative for chills and fever.  HENT: Negative for congestion, rhinorrhea and sore throat.   Eyes: Positive for visual disturbance. Negative for photophobia and pain.  Respiratory: Negative for cough and shortness of breath.   Cardiovascular: Negative for chest pain and leg swelling.  Gastrointestinal: Negative for abdominal pain, diarrhea, nausea and vomiting.  Genitourinary: Negative for dysuria.  Musculoskeletal: Negative for back pain and neck pain.  Skin: Negative for rash.  Neurological: Positive for dizziness. Negative for speech difficulty, weakness, light-headedness, numbness and headaches.  Hematological: Does not bruise/bleed easily.  Psychiatric/Behavioral: Negative for confusion.    Physical Exam Updated Vital Signs BP (!) 122/54   Pulse (!) 55   Temp (!) 97.5 F (36.4 C) (Oral)   Resp 16   SpO2 100%   Physical Exam Vitals and nursing note reviewed.  Constitutional:      General: She is not in acute distress.    Appearance: Normal appearance. She is well-developed. She is obese.  HENT:     Head: Normocephalic and atraumatic.  Eyes:     Conjunctiva/sclera: Conjunctivae normal.     Pupils: Pupils are equal, round, and reactive to light.  Cardiovascular:     Rate and Rhythm: Normal rate and regular rhythm.     Heart sounds: No murmur.  Pulmonary:     Effort: Pulmonary effort is normal. No respiratory distress.     Breath sounds: Normal breath sounds.  Abdominal:     Palpations: Abdomen is soft.     Tenderness: There is no abdominal tenderness.  Musculoskeletal:        General: No swelling.     Cervical back: Normal range of motion and neck supple.  Skin:    General: Skin is warm and dry.  Neurological:     Mental Status: She is alert.     Cranial Nerves: Cranial nerve deficit present.     Gait: Gait abnormal.     ED Results / Procedures / Treatments   Labs (all labs ordered are listed, but only abnormal results  are displayed) Labs Reviewed  COMPREHENSIVE METABOLIC PANEL - Abnormal; Notable for the following components:      Result Value   Glucose, Bld 108 (*)    Creatinine, Ser 1.29 (*)    Albumin 3.1 (*)    GFR calc non Af Amer 42 (*)    GFR calc Af Amer 48 (*)    All other components within normal limits  CBC WITH DIFFERENTIAL/PLATELET - Abnormal; Notable for the following components:   MCV 100.8 (*)    All other components within normal limits  APTT - Abnormal; Notable for the following components:   aPTT 21 (*)    All other components within normal limits  I-STAT CHEM 8, ED - Abnormal; Notable for the following components:   Sodium 131 (*)  Potassium 6.8 (*)    Creatinine, Ser 1.20 (*)    Glucose, Bld 109 (*)    Calcium, Ion 0.92 (*)    All other components within normal limits  I-STAT CHEM 8, ED - Abnormal; Notable for the following components:   Creatinine, Ser 1.30 (*)    Glucose, Bld 105 (*)    Calcium, Ion 1.06 (*)    All other components within normal limits  ETHANOL  CBC  DIFFERENTIAL  PROTIME-INR  RAPID URINE DRUG SCREEN, HOSP PERFORMED  URINALYSIS, ROUTINE W REFLEX MICROSCOPIC  CBC  ETHANOL  CBG MONITORING, ED    EKG EKG Interpretation  Date/Time:  Sunday November 03 2019 11:22:18 EST Ventricular Rate:  48 PR Interval:    QRS Duration: 93 QT Interval:  465 QTC Calculation: 416 R Axis:   39 Text Interpretation: Sinus bradycardia Atrial premature complex No previous ECGs available Artifact Confirmed by Vanetta Mulders [phone]) on 11/03/2019 11:29:51 AM   Radiology CT HEAD WO CONTRAST  Result Date: 11/03/2019 CLINICAL DATA:  Onset dizziness and unsteady gait today. EXAM: CT HEAD WITHOUT CONTRAST TECHNIQUE: Contiguous axial images were obtained from the base of the skull through the vertex without intravenous contrast. COMPARISON:  None. FINDINGS: Brain: No evidence of acute infarction, hemorrhage, hydrocephalus, extra-axial collection or mass lesion/mass  effect. Cortical atrophy is noted. Vascular: No hyperdense vessel or unexpected calcification. Skull: Intact.  No focal lesion. Sinuses/Orbits: Status post cataract surgery.  Otherwise negative. Other: None. IMPRESSION: No acute abnormality. Cortical atrophy. Electronically Signed   By: Drusilla Kanner M.D.   On: 11/03/2019 12:59   MR Brain Wo Contrast (neuro protocol)  Result Date: 11/03/2019 CLINICAL DATA:  74 year old female with dizziness upon waking today. Bradycardia. EXAM: MRI HEAD WITHOUT CONTRAST TECHNIQUE: Multiplanar, multiecho pulse sequences of the brain and surrounding structures were obtained without intravenous contrast. COMPARISON:  Head CT earlier today. FINDINGS: Brain: There is mildly asymmetric subarachnoid space CSF redemonstrated along the left anterior frontal convexity. No evidence of extra-axial blood or collection. No areas of disproportion cerebral atrophy identified. Confluent 12 mm focus of restricted diffusion in the anteromedial right thalamus on series 5, image 72. Associated faint T2 and FLAIR hyperintensity. No hemorrhage or mass effect. No other restricted diffusion. And elsewhere gray and white matter signal is normal for age throughout the brain. No cortical encephalomalacia or chronic cerebral blood products. No midline shift, mass effect, evidence of mass lesion, ventriculomegaly, extra-axial collection or acute intracranial hemorrhage. Cervicomedullary junction and pituitary are within normal limits. Vascular: Major intracranial vascular flow voids are preserved. Skull and upper cervical spine: Negative for age visible cervical spine. Normal bone marrow signal. Sinuses/Orbits: Postoperative changes to both globes otherwise negative orbits. Trace paranasal sinus mucosal thickening, small fluid level in the right sphenoid sinus. Other: Mastoids are well pneumatized. Visible internal auditory structures appear normal. Normal stylomastoid foramina. IMPRESSION: 1. Acute  lacunar infarct in the right thalamus. No associated hemorrhage or mass effect. 2. But elsewhere normal for age noncontrast MRI appearance of the brain. Electronically Signed   By: Odessa Fleming M.D.   On: 11/03/2019 15:41    Procedures Procedures (including critical care time)  CRITICAL CARE Performed by: Vanetta Mulders Total critical care time: 30 minutes Critical care time was exclusive of separately billable procedures and treating other patients. Critical care was necessary to treat or prevent imminent or life-threatening deterioration. Critical care was time spent personally by me on the following activities: development of treatment plan with patient and/or surrogate as well  as nursing, discussions with consultants, evaluation of patient's response to treatment, examination of patient, obtaining history from patient or surrogate, ordering and performing treatments and interventions, ordering and review of laboratory studies, ordering and review of radiographic studies, pulse oximetry and re-evaluation of patient's condition.   Medications Ordered in ED Medications - No data to display  ED Course  I have reviewed the triage vital signs and the nursing notes.  Pertinent labs & imaging results that were available during my care of the patient were reviewed by me and considered in my medical decision making (see chart for details).    MDM Rules/Calculators/A&P                     Patient's MRI shows evidence of an acute lacunar infarct.  We will notify the neuro hospitalist and contact the [facility] for admission.  Final Clinical Impression(s) / ED Diagnoses Final diagnoses:  Cerebrovascular accident (CVA), unspecified mechanism [facility])    Rx / DC Orders ED Discharge Orders    None       Vanetta Mulders, MD 11/03/19 [phone]

## 2019-11-03 NOTE — ED Notes (Signed)
Patient transported to MRI 

## 2019-11-03 NOTE — H&P (Signed)
History and Physical        Hospital Admission Note Date: 11/03/2019   Patient name: Susan Fernandez Medical record number: [id number] Date of birth: September 08, 1946 Age: 74 y.o. Gender: female  PCP: [name], MD  Patient coming from: home Lives with: alone At baseline, ambulates: independently   Chief Complaint    Dizziness   HPI:   This is a 74 year old female with past medical history of GERD, CKD 3, hypertension, hyperlipidemia, morbid obesity, fall 1 year ago leading to chronic dizziness, posterior vitreous detachment and cataracts who presented to the ED on 2/21 with acute on chronic onset dizziness and double vision.  Last known normal at 9:30 PM last night, awoke at 6 AM with stated issues.  Was having difficulty with walking at home needing her to hang onto things.  Called her friend who insisted to come to the ED.  CT head without contrast without acute abnormality.   MRI brain in ED which showed acute lacunar infarct of right thalamus without associated hemorrhage or mass-effect.  Neuro hospitalist was consulted.  She stated that she was on the commode at home when she slipped off onto the floor due to the dizziness. Did not hit her head, did not have LOC. Denies tobacco, alcohol use. Stated she has not been taking her statin for 2 months at least as she ran out. Only takes Bisoprolol antihypertensive.   Family history noncontributory  Notable ED labs: Na 131, K 6.8->4.2 without intervention, glucose 109, Cr 1.2 (baseline 1.0-1.2), UA: small leukocytes, small Hb, bacteria present, squamous epithelial cells 6-10, WBC 11-20, RBC 6-10, UDS negative  ED treatments: None  ED imaging:  CT head without contrast without acute abnormality.    MRI brain in ED which showed acute lacunar infarct of right thalamus without associated hemorrhage or mass-effect  ED vitals:    Vitals:   11/03/19 1500 11/03/19 1630  BP:  126/69  Pulse: (!) 55 85  Resp: 16 18  Temp:    SpO2: 100% 97%     Review of Systems:  Review of Systems  Constitutional: Negative for chills and fever.  HENT: Negative.   Eyes: Positive for blurred vision. Negative for double vision and photophobia.       Stated more blurry vision and not double vision  Respiratory: Negative for sputum production and shortness of breath.   Cardiovascular: Negative for chest pain and palpitations.  Gastrointestinal: Negative for nausea and vomiting.  Genitourinary: Negative.   Musculoskeletal: Positive for falls. Negative for myalgias.  Neurological: Positive for dizziness. Negative for tingling, sensory change, speech change, loss of consciousness, weakness and headaches.  Psychiatric/Behavioral: Negative for substance abuse.  All other systems reviewed and are negative.   Medical/Social/Family History   Past Medical History: Past Medical History:  Diagnosis Date  . Cataract   . GERD (gastroesophageal reflux disease)   . Hyperlipidemia   . Hypertension 1995  . IBS (irritable bowel syndrome)   . Morbid obesity (HCC)   . 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 Posterior vitreous detachment 10/29/2015  . Prediabetes 2016  . Venous insufficiency     Past Surgical History:  Procedure Laterality Date  . CATARACT EXTRACTION, BILATERAL Bilateral 2018  L- 01/2017, R-02/2017  . RETINAL TEAR REPAIR CRYOTHERAPY Bilateral 2000   Dr. Cecilie Kicks    Medications: Prior to Admission medications   Medication Sig Start Date End Date Taking? Authorizing Provider  bisoprolol-hydrochlorothiazide (ZIAC) 5-6.25 MG tablet Take 1 tablet Daily for BP Patient taking differently: Take 1 tablet by mouth daily.  06/06/19  Yes [name], MD  fluticasone [facility]) 50 MCG/ACT nasal spray Place 2 sprays into both nostrils daily. Patient taking differently: Place 2 sprays into both nostrils daily as needed.  12/22/15   Yes Shirleen Schirmer, PA-C  atorvastatin (LIPITOR) 80 MG tablet TAKE 1/2 TO 1 TABLET BY MOUTH EVERY DAY AS DIRECTED Patient not taking: Reported on 11/03/2019 06/25/17   [name], MD  losartan (COZAAR) 50 MG tablet Take 1 tablet (50 mg total) by mouth daily. Patient not taking: Reported on 11/03/2019 06/21/17 06/21/18  Judd Gaudier, NP  rosuvastatin (CRESTOR) 20 MG tablet Take 1 tablet (20 mg total) by mouth at bedtime. Patient not taking: Reported on 11/03/2019 06/17/18   Quentin Mulling, PA-C    Allergies:   Allergies  Allergen Reactions  . Ppd [Tuberculin Purified Protein Derivative]   . Sulfa Antibiotics   . Tolectin [Tolmetin]     Social History:  reports that she has never smoked. She has never used smokeless tobacco. No history on file for alcohol and drug.  Family History: History reviewed. No pertinent family history.   Objective   Physical Exam: Blood pressure 126/69, pulse 85, temperature (!) 97.5 F (36.4 C), temperature source Oral, resp. rate 18, SpO2 97 %.  Physical Exam Vitals and nursing note reviewed.  Constitutional:      Appearance: Normal appearance.  HENT:     Head: Normocephalic and atraumatic.     Mouth/Throat:     Mouth: Mucous membranes are moist.     Comments: No maxillary teeth  Eyes:     Extraocular Movements: Extraocular movements intact.     Conjunctiva/sclera: Conjunctivae normal.     Pupils: Pupils are equal, round, and reactive to light.  Cardiovascular:     Rate and Rhythm: Regular rhythm. Bradycardia present.     Heart sounds: No murmur.  Pulmonary:     Effort: Pulmonary effort is normal.     Breath sounds: Normal breath sounds.  Abdominal:     General: Abdomen is flat.     Palpations: Abdomen is soft.  Musculoskeletal:        General: No swelling or tenderness.     Comments: 5/5 bilateral upper and lower muscle strength intact  Skin:    Coloration: Skin is not jaundiced or pale.  Neurological:     Mental Status:  She is alert and oriented to person, place, and time.     Cranial Nerves: No cranial nerve deficit.     Sensory: No sensory deficit.     Comments: Gait not assessed  Psychiatric:        Mood and Affect: Mood normal.        Behavior: Behavior normal.     LABS on Admission: I have personally reviewed all the labs and imaging below    Basic Metabolic Panel: Recent Labs  Lab 11/03/19 1207 11/03/19 1211  NA 135 136  K 4.2 4.1  CL 101 101  CO2 24  --   GLUCOSE 108* 105*  BUN 10 11  CREATININE 1.29* 1.30*  CALCIUM 8.9  --    Liver Function Tests: Recent Labs  Lab 11/03/19 1207  AST 17  ALT 12  ALKPHOS 64  BILITOT 0.5  PROT 7.3  ALBUMIN 3.1*   No results for input(s): LIPASE, AMYLASE in the last 168 hours. No results for input(s): AMMONIA in the last 168 hours. CBC: Recent Labs  Lab 11/03/19 1154 11/03/19 1156 11/03/19 1207 11/03/19 1211  WBC DUPLICATE REQUEST  --  5.2  --   NEUTROABS DUPLICATE REQUEST   < > 3.4  --   HGB DUPLICATE REQUEST   < > 12.5 12.9  HCT DUPLICATE REQUEST   < > 39.8 38.0  MCV DUPLICATE REQUEST   < > 100.8*  --   PLT DUPLICATE REQUEST  --  249  --    < > = values in this interval not displayed.   Cardiac Enzymes: No results for input(s): CKTOTAL, CKMB, CKMBINDEX, TROPONINI in the last 168 hours. BNP: Invalid input(s): POCBNP CBG: Recent Labs  Lab 11/03/19 1114  GLUCAP 98    Radiological Exams on Admission:  CT HEAD WO CONTRAST  Result Date: 11/03/2019 CLINICAL DATA:  Onset dizziness and unsteady gait today. EXAM: CT HEAD WITHOUT CONTRAST TECHNIQUE: Contiguous axial images were obtained from the base of the skull through the vertex without intravenous contrast. COMPARISON:  None. FINDINGS: Brain: No evidence of acute infarction, hemorrhage, hydrocephalus, extra-axial collection or mass lesion/mass effect. Cortical atrophy is noted. Vascular: No hyperdense vessel or unexpected calcification. Skull: Intact.  No focal lesion.  Sinuses/Orbits: Status post cataract surgery.  Otherwise negative. Other: None. IMPRESSION: No acute abnormality. Cortical atrophy. Electronically Signed   By: Drusilla Kanner M.D.   On: 11/03/2019 12:59   MR Brain Wo Contrast (neuro protocol)  Result Date: 11/03/2019 CLINICAL DATA:  74 year old female with dizziness upon waking today. Bradycardia. EXAM: MRI HEAD WITHOUT CONTRAST TECHNIQUE: Multiplanar, multiecho pulse sequences of the brain and surrounding structures were obtained without intravenous contrast. COMPARISON:  Head CT earlier today. FINDINGS: Brain: There is mildly asymmetric subarachnoid space CSF redemonstrated along the left anterior frontal convexity. No evidence of extra-axial blood or collection. No areas of disproportion cerebral atrophy identified. Confluent 12 mm focus of restricted diffusion in the anteromedial right thalamus on series 5, image 72. Associated faint T2 and FLAIR hyperintensity. No hemorrhage or mass effect. No other restricted diffusion. And elsewhere gray and white matter signal is normal for age throughout the brain. No cortical encephalomalacia or chronic cerebral blood products. No midline shift, mass effect, evidence of mass lesion, ventriculomegaly, extra-axial collection or acute intracranial hemorrhage. Cervicomedullary junction and pituitary are within normal limits. Vascular: Major intracranial vascular flow voids are preserved. Skull and upper cervical spine: Negative for age visible cervical spine. Normal bone marrow signal. Sinuses/Orbits: Postoperative changes to both globes otherwise negative orbits. Trace paranasal sinus mucosal thickening, small fluid level in the right sphenoid sinus. Other: Mastoids are well pneumatized. Visible internal auditory structures appear normal. Normal stylomastoid foramina. IMPRESSION: 1. Acute lacunar infarct in the right thalamus. No associated hemorrhage or mass effect. 2. But elsewhere normal for age noncontrast MRI  appearance of the brain. Electronically Signed   By: Odessa Fleming M.D.   On: 11/03/2019 15:41      EKG: Independently reviewed.  NSR with PACs   A & P   Active Problems:   * No active hospital problems. *   1. Acute Lacunar Infarct in Right Thalamus a. Hemodynamically stable on room air, with asymptomatic bradycardia b. Neurology consulted by ED, appreciate further recommendations c. Lipitor 40 mg d. HA1C e. Lipid panel f. Telemetry g. PT/OT 2.  Hypertension a. Start Amlodipine b. Hold off on bisoprolol 3. Asymptomatic Bradycardia a. Hold bisoprolol 4. Asymptomatic bacteriuria a. UA with Sq Epi cells, likely contaminant 5. CKD 3, at baseline 6. Hyperkalemia, likely false read as resolved without intervention 7. Hyponatremia, resolved     DVT prophylaxis: lovenox   Code Status:  Modified code (Yes CPR, No Intubation) Diet: heart healthy Family Communication: Admission, patients condition and plan of care including tests being ordered have been discussed with the patient who indicates understanding and agrees with the plan and Code Status. Patient requested that I call her brother. His number in the system did not work  Disposition Plan: The appropriate patient status for this patient is OBSERVATION. Observation status is judged to be reasonable and necessary in order to provide the required intensity of service to ensure the patient's safety. The patient's presenting symptoms, physical exam findings, and initial radiographic and laboratory data in the context of their medical condition is felt to place them at decreased risk for further clinical deterioration. Furthermore, it is anticipated that the patient will be medically stable for discharge from the hospital within 2 midnights of admission. The following factors support the patient status of observation.   " The patient's presenting symptoms include dizziness, blurry vision. " The physical exam findings are generally  unremarkable. " The initial radiographic and laboratory data are concerning for acute lacunar infarct of right thalamus    Consultants  . Neurology  Procedures  . none  Time Spent on Admission: 65 minutes    Harold Hedge, DO [facility] Pager [phone] 11/03/2019, 5:35 PM

## 2019-11-04 ENCOUNTER — Inpatient Hospital Stay (HOSPITAL_COMMUNITY): Payer: Medicare PPO

## 2019-11-04 ENCOUNTER — Observation Stay (HOSPITAL_COMMUNITY): Payer: Medicare PPO

## 2019-11-04 ENCOUNTER — Encounter (HOSPITAL_COMMUNITY): Payer: Medicare PPO

## 2019-11-04 DIAGNOSIS — N390 Urinary tract infection, site not specified: Secondary | ICD-10-CM | POA: Diagnosis present

## 2019-11-04 DIAGNOSIS — M25562 Pain in left knee: Secondary | ICD-10-CM | POA: Diagnosis present

## 2019-11-04 DIAGNOSIS — E1151 Type 2 diabetes mellitus with diabetic peripheral angiopathy without gangrene: Secondary | ICD-10-CM | POA: Diagnosis present

## 2019-11-04 DIAGNOSIS — I6381 Other cerebral infarction due to occlusion or stenosis of small artery: Secondary | ICD-10-CM | POA: Diagnosis present

## 2019-11-04 DIAGNOSIS — Z20822 Contact with and (suspected) exposure to covid-19: Secondary | ICD-10-CM | POA: Diagnosis present

## 2019-11-04 DIAGNOSIS — I6389 Other cerebral infarction: Secondary | ICD-10-CM | POA: Diagnosis not present

## 2019-11-04 DIAGNOSIS — Z882 Allergy status to sulfonamides status: Secondary | ICD-10-CM | POA: Diagnosis not present

## 2019-11-04 DIAGNOSIS — I129 Hypertensive chronic kidney disease with stage 1 through stage 4 chronic kidney disease, or unspecified chronic kidney disease: Secondary | ICD-10-CM | POA: Diagnosis present

## 2019-11-04 DIAGNOSIS — N1831 Chronic kidney disease, stage 3a: Secondary | ICD-10-CM | POA: Diagnosis present

## 2019-11-04 DIAGNOSIS — R29810 Facial weakness: Secondary | ICD-10-CM | POA: Diagnosis present

## 2019-11-04 DIAGNOSIS — I10 Essential (primary) hypertension: Secondary | ICD-10-CM | POA: Diagnosis not present

## 2019-11-04 DIAGNOSIS — M79605 Pain in left leg: Secondary | ICD-10-CM | POA: Diagnosis present

## 2019-11-04 DIAGNOSIS — E785 Hyperlipidemia, unspecified: Secondary | ICD-10-CM | POA: Diagnosis present

## 2019-11-04 DIAGNOSIS — I639 Cerebral infarction, unspecified: Secondary | ICD-10-CM

## 2019-11-04 DIAGNOSIS — Z79899 Other long term (current) drug therapy: Secondary | ICD-10-CM | POA: Diagnosis not present

## 2019-11-04 DIAGNOSIS — E782 Mixed hyperlipidemia: Secondary | ICD-10-CM

## 2019-11-04 DIAGNOSIS — E871 Hypo-osmolality and hyponatremia: Secondary | ICD-10-CM | POA: Diagnosis present

## 2019-11-04 DIAGNOSIS — M25552 Pain in left hip: Secondary | ICD-10-CM | POA: Diagnosis present

## 2019-11-04 DIAGNOSIS — Z9181 History of falling: Secondary | ICD-10-CM | POA: Diagnosis not present

## 2019-11-04 DIAGNOSIS — Z887 Allergy status to serum and vaccine status: Secondary | ICD-10-CM | POA: Diagnosis not present

## 2019-11-04 DIAGNOSIS — K219 Gastro-esophageal reflux disease without esophagitis: Secondary | ICD-10-CM | POA: Diagnosis present

## 2019-11-04 DIAGNOSIS — M25551 Pain in right hip: Secondary | ICD-10-CM | POA: Diagnosis present

## 2019-11-04 DIAGNOSIS — Z6834 Body mass index (BMI) 34.0-34.9, adult: Secondary | ICD-10-CM | POA: Diagnosis not present

## 2019-11-04 DIAGNOSIS — G8929 Other chronic pain: Secondary | ICD-10-CM | POA: Diagnosis present

## 2019-11-04 DIAGNOSIS — M79604 Pain in right leg: Secondary | ICD-10-CM | POA: Diagnosis present

## 2019-11-04 DIAGNOSIS — R29701 NIHSS score 1: Secondary | ICD-10-CM | POA: Diagnosis present

## 2019-11-04 DIAGNOSIS — E1122 Type 2 diabetes mellitus with diabetic chronic kidney disease: Secondary | ICD-10-CM | POA: Diagnosis present

## 2019-11-04 LAB — CBC
HCT: 35.1 % — ABNORMAL LOW (ref 36.0–46.0)
Hemoglobin: 11.4 g/dL — ABNORMAL LOW (ref 12.0–15.0)
MCH: 31.3 pg (ref 26.0–34.0)
MCHC: 32.5 g/dL (ref 30.0–36.0)
MCV: 96.4 fL (ref 80.0–100.0)
Platelets: 247 10*3/uL (ref 150–400)
RBC: 3.64 MIL/uL — ABNORMAL LOW (ref 3.87–5.11)
RDW: 12.9 % (ref 11.5–15.5)
WBC: 6 10*3/uL (ref 4.0–10.5)
nRBC: 0 % (ref 0.0–0.2)

## 2019-11-04 LAB — BASIC METABOLIC PANEL
Anion gap: 11 (ref 5–15)
BUN: 9 mg/dL (ref 8–23)
CO2: 25 mmol/L (ref 22–32)
Calcium: 8.7 mg/dL — ABNORMAL LOW (ref 8.9–10.3)
Chloride: 100 mmol/L (ref 98–111)
Creatinine, Ser: 1.25 mg/dL — ABNORMAL HIGH (ref 0.44–1.00)
GFR calc Af Amer: 50 mL/min — ABNORMAL LOW (ref >60)
GFR calc non Af Amer: 43 mL/min — ABNORMAL LOW (ref >60)
Glucose, Bld: 111 mg/dL — ABNORMAL HIGH (ref 70–99)
Potassium: 3.7 mmol/L (ref 3.5–5.1)
Sodium: 136 mmol/L (ref 135–145)

## 2019-11-04 LAB — ECHOCARDIOGRAM COMPLETE
Height: 69 in
Weight: 3756.64 oz

## 2019-11-04 MED ORDER — IOHEXOL 350 MG/ML SOLN
100.0000 mL | Freq: Once | INTRAVENOUS | Status: AC | PRN
  Administered 2019-11-04: 100 mL via INTRAVENOUS

## 2019-11-04 MED ORDER — ATORVASTATIN CALCIUM 80 MG PO TABS
80.0000 mg | ORAL_TABLET | Freq: Every day | ORAL | Status: DC
  Administered 2019-11-04: 18:00:00 80 mg via ORAL
  Filled 2019-11-04: qty 1

## 2019-11-04 MED ORDER — ACETAMINOPHEN 325 MG PO TABS
650.0000 mg | ORAL_TABLET | Freq: Four times a day (QID) | ORAL | Status: AC
  Administered 2019-11-04 (×3): 650 mg via ORAL
  Filled 2019-11-04 (×3): qty 2

## 2019-11-04 MED ORDER — ASPIRIN EC 81 MG PO TBEC
81.0000 mg | DELAYED_RELEASE_TABLET | Freq: Every day | ORAL | Status: DC
  Administered 2019-11-04 – 2019-11-05 (×2): 81 mg via ORAL
  Filled 2019-11-04 (×2): qty 1

## 2019-11-04 NOTE — Progress Notes (Signed)
STROKE TEAM PROGRESS NOTE   INTERVAL HISTORY Pt lying in bed, stated that her dizziness and blurry vision are gone at this moment.  She had it this morning while woke up but currently symptom resolved.  MRI showed right medial thalamic infarct.  CTA head and neck unremarkable.  Vitals:   11/04/19 0314 11/04/19 0734 11/04/19 1146 11/04/19 1400  BP: 106/90 (!) 133/51 (!) 144/62   Pulse: 60 (!) 55 (!) 55   Resp: 19 18 18 18   Temp: 98.5 F (36.9 C) 98.3 F (36.8 C) 98.7 F (37.1 C)   TempSrc: Oral Oral Oral   SpO2: 98% 100% 99%   Weight:      Height:        CBC:  Recent Labs  Lab 11/03/19 1154 11/03/19 1156 11/03/19 1207 11/03/19 1207 11/03/19 1211 92/42/68 3419  WBC DUPLICATE REQUEST   < > 5.2  --   --  6.0  NEUTROABS DUPLICATE REQUEST  --  3.4  --   --   --   HGB DUPLICATE REQUEST   < > 12.5   < > 62.2 29.7*  HCT DUPLICATE REQUEST   < > 39.8   < > 98.9 21.1*  MCV DUPLICATE REQUEST   < > 100.8*  --   --  94.1  PLT DUPLICATE REQUEST   < > 249  --   --  247   < > = values in this interval not displayed.    Basic Metabolic Panel:  Recent Labs  Lab 11/03/19 1207 11/03/19 1207 11/03/19 1211 11/04/19 0341  NA 135   < > 136 136  K 4.2   < > 4.1 3.7  CL 101   < > 101 100  CO2 24  --   --  25  GLUCOSE 108*   < > 105* 111*  BUN 10   < > 11 9  CREATININE 1.29*   < > 1.30* 1.25*  CALCIUM 8.9  --   --  8.7*   < > = values in this interval not displayed.   Lipid Panel:     Component Value Date/Time   CHOL 292 (H) 11/03/2019 1207   TRIG 111 11/03/2019 1207   HDL 40 (L) 11/03/2019 1207   CHOLHDL 7.3 11/03/2019 1207   VLDL 22 11/03/2019 1207   LDLCALC 230 (H) 11/03/2019 1207   LDLCALC 244 (H) 06/15/2018 1120   HgbA1c:  Lab Results  Component Value Date   HGBA1C 5.9 (H) 11/03/2019   Urine Drug Screen:     Component Value Date/Time   LABOPIA NONE DETECTED 11/03/2019 1643   COCAINSCRNUR NONE DETECTED 11/03/2019 1643   LABBENZ NONE DETECTED 11/03/2019 1643    AMPHETMU NONE DETECTED 11/03/2019 1643   THCU NONE DETECTED 11/03/2019 1643   LABBARB NONE DETECTED 11/03/2019 1643    Alcohol Level     Component Value Date/Time   ETH <10 11/03/2019 2227    IMAGING past 48 hours CT ANGIO HEAD W OR WO CONTRAST  Result Date: 11/04/2019 CLINICAL DATA:  Stroke EXAM: CT ANGIOGRAPHY HEAD AND NECK TECHNIQUE: Multidetector CT imaging of the head and neck was performed using the standard protocol during bolus administration of intravenous contrast. Multiplanar CT image reconstructions and MIPs were obtained to evaluate the vascular anatomy. Carotid stenosis measurements (when applicable) are obtained utilizing NASCET criteria, using the distal internal carotid diameter as the denominator. CONTRAST:  173mL OMNIPAQUE IOHEXOL 350 MG/ML SOLN COMPARISON:  CT head 11/03/2019.  MRI head 11/03/2019 FINDINGS: CT  HEAD FINDINGS Brain: Interval development of hypodensity right medial thalamus compatible with acute infarct as noted on MRI. No associated hemorrhage. No other infarct. No hemorrhage or mass. Ventricle size normal. Small subdural hygroma left frontal lobe unchanged. Vascular: Negative for hyperdense vessel Skull: Negative Sinuses: Air-fluid level sphenoid sinus. Mild mucosal edema remaining paranasal sinuses. Orbits: Bilateral cataract surgery.  No mass lesion. Review of the MIP images confirms the above findings CTA NECK FINDINGS Aortic arch: Standard branching. Imaged portion shows no evidence of aneurysm or dissection. No significant stenosis of the major arch vessel origins. Atherosclerotic calcification aortic arch. Right carotid system: Mild atherosclerotic disease right carotid bifurcation without stenosis Left carotid system: Mild atherosclerotic disease left carotid bifurcation without stenosis. Vertebral arteries: Small vertebral arteries are patent to the basilar without stenosis. Skeleton: Degenerative changes cervical spine. No acute skeletal abnormality. Other  neck: Negative for mass or adenopathy Upper chest: Lung apices clear bilaterally. Review of the MIP images confirms the above findings CTA HEAD FINDINGS Anterior circulation: Atherosclerotic calcification in the cavernous carotid bilaterally without significant stenosis. Anterior and middle cerebral arteries patent bilaterally without stenosis. Posterior circulation: Both vertebral arteries patent to the basilar. PICA patent bilaterally. Basilar widely patent. Superior cerebellar and posterior cerebral arteries patent bilaterally. Fetal origin of the posterior cerebral artery bilaterally. Venous sinuses: Negative Anatomic variants: None Review of the MIP images confirms the above findings IMPRESSION: 1. Acute infarct right thalamus.  Negative for hemorrhage. 2. No significant carotid or vertebral artery stenosis in the neck. Mild atherosclerotic disease in the carotid bifurcation bilaterally 3. Negative for intracranial large vessel occlusion. Electronically Signed   By: Marlan Palauharles  Clark M.D.   On: 11/04/2019 13:39   DG Knee 1-2 Views Left  Result Date: 11/04/2019 CLINICAL DATA:  Posterior left knee pain since a fall yesterday. Initial encounter. EXAM: LEFT KNEE - 1-2 VIEW COMPARISON:  None. FINDINGS: There is no acute bony or joint abnormality. Tricompartmental osteophytes are present. No joint effusion or chondrocalcinosis. IMPRESSION: No acute abnormality. Osteoarthritis. Electronically Signed   By: Drusilla Kannerhomas  Dalessio M.D.   On: 11/04/2019 12:46   CT HEAD WO CONTRAST  Result Date: 11/03/2019 CLINICAL DATA:  Onset dizziness and unsteady gait today. EXAM: CT HEAD WITHOUT CONTRAST TECHNIQUE: Contiguous axial images were obtained from the base of the skull through the vertex without intravenous contrast. COMPARISON:  None. FINDINGS: Brain: No evidence of acute infarction, hemorrhage, hydrocephalus, extra-axial collection or mass lesion/mass effect. Cortical atrophy is noted. Vascular: No hyperdense vessel or  unexpected calcification. Skull: Intact.  No focal lesion. Sinuses/Orbits: Status post cataract surgery.  Otherwise negative. Other: None. IMPRESSION: No acute abnormality. Cortical atrophy. Electronically Signed   By: Drusilla Kannerhomas  Dalessio M.D.   On: 11/03/2019 12:59   CT ANGIO NECK W OR WO CONTRAST  Result Date: 11/04/2019 CLINICAL DATA:  Stroke EXAM: CT ANGIOGRAPHY HEAD AND NECK TECHNIQUE: Multidetector CT imaging of the head and neck was performed using the standard protocol during bolus administration of intravenous contrast. Multiplanar CT image reconstructions and MIPs were obtained to evaluate the vascular anatomy. Carotid stenosis measurements (when applicable) are obtained utilizing NASCET criteria, using the distal internal carotid diameter as the denominator. CONTRAST:  100mL OMNIPAQUE IOHEXOL 350 MG/ML SOLN COMPARISON:  CT head 11/03/2019.  MRI head 11/03/2019 FINDINGS: CT HEAD FINDINGS Brain: Interval development of hypodensity right medial thalamus compatible with acute infarct as noted on MRI. No associated hemorrhage. No other infarct. No hemorrhage or mass. Ventricle size normal. Small subdural hygroma left frontal lobe unchanged. Vascular:  Negative for hyperdense vessel Skull: Negative Sinuses: Air-fluid level sphenoid sinus. Mild mucosal edema remaining paranasal sinuses. Orbits: Bilateral cataract surgery.  No mass lesion. Review of the MIP images confirms the above findings CTA NECK FINDINGS Aortic arch: Standard branching. Imaged portion shows no evidence of aneurysm or dissection. No significant stenosis of the major arch vessel origins. Atherosclerotic calcification aortic arch. Right carotid system: Mild atherosclerotic disease right carotid bifurcation without stenosis Left carotid system: Mild atherosclerotic disease left carotid bifurcation without stenosis. Vertebral arteries: Small vertebral arteries are patent to the basilar without stenosis. Skeleton: Degenerative changes cervical  spine. No acute skeletal abnormality. Other neck: Negative for mass or adenopathy Upper chest: Lung apices clear bilaterally. Review of the MIP images confirms the above findings CTA HEAD FINDINGS Anterior circulation: Atherosclerotic calcification in the cavernous carotid bilaterally without significant stenosis. Anterior and middle cerebral arteries patent bilaterally without stenosis. Posterior circulation: Both vertebral arteries patent to the basilar. PICA patent bilaterally. Basilar widely patent. Superior cerebellar and posterior cerebral arteries patent bilaterally. Fetal origin of the posterior cerebral artery bilaterally. Venous sinuses: Negative Anatomic variants: None Review of the MIP images confirms the above findings IMPRESSION: 1. Acute infarct right thalamus.  Negative for hemorrhage. 2. No significant carotid or vertebral artery stenosis in the neck. Mild atherosclerotic disease in the carotid bifurcation bilaterally 3. Negative for intracranial large vessel occlusion. Electronically Signed   By: Marlan Palau M.D.   On: 11/04/2019 13:39   MR Brain Wo Contrast (neuro protocol)  Result Date: 11/03/2019 CLINICAL DATA:  74 year old female with dizziness upon waking today. Bradycardia. EXAM: MRI HEAD WITHOUT CONTRAST TECHNIQUE: Multiplanar, multiecho pulse sequences of the brain and surrounding structures were obtained without intravenous contrast. COMPARISON:  Head CT earlier today. FINDINGS: Brain: There is mildly asymmetric subarachnoid space CSF redemonstrated along the left anterior frontal convexity. No evidence of extra-axial blood or collection. No areas of disproportion cerebral atrophy identified. Confluent 12 mm focus of restricted diffusion in the anteromedial right thalamus on series 5, image 72. Associated faint T2 and FLAIR hyperintensity. No hemorrhage or mass effect. No other restricted diffusion. And elsewhere gray and white matter signal is normal for age throughout the brain.  No cortical encephalomalacia or chronic cerebral blood products. No midline shift, mass effect, evidence of mass lesion, ventriculomegaly, extra-axial collection or acute intracranial hemorrhage. Cervicomedullary junction and pituitary are within normal limits. Vascular: Major intracranial vascular flow voids are preserved. Skull and upper cervical spine: Negative for age visible cervical spine. Normal bone marrow signal. Sinuses/Orbits: Postoperative changes to both globes otherwise negative orbits. Trace paranasal sinus mucosal thickening, small fluid level in the right sphenoid sinus. Other: Mastoids are well pneumatized. Visible internal auditory structures appear normal. Normal stylomastoid foramina. IMPRESSION: 1. Acute lacunar infarct in the right thalamus. No associated hemorrhage or mass effect. 2. But elsewhere normal for age noncontrast MRI appearance of the brain. Electronically Signed   By: Odessa Fleming M.D.   On: 11/03/2019 15:41   ECHOCARDIOGRAM COMPLETE  Result Date: 11/04/2019    ECHOCARDIOGRAM REPORT   Patient Name:   Susan Fernandez Date of Exam: 11/04/2019 Medical Rec #:  [id number]           Height: Accession #:    [id number]          Weight: Date of Birth:  1946/07/04           BSA: Patient Age:    73 years            BP:  144/62 mmHg Patient Gender: F                   HR:           54 bpm. Exam Location:  Inpatient Procedure: 2D Echo Indications:    Stroke 434.91 / I163.9  History:        Patient has no prior history of Echocardiogram examinations.                 Risk Factors:Hypertension, Dyslipidemia and Obesity. CKD.  Sonographer:    Leeroy Bock Turrentine Referring Phys: [id number] DANIEL V THOMPSON IMPRESSIONS  1. Left ventricular ejection fraction, by estimation, is 60 to 65%. The left ventricle has normal function. The left ventricle has no regional wall motion abnormalities. Left ventricular diastolic parameters were normal.  2. Right ventricular systolic function is normal. The right  ventricular size is normal.  3. The mitral valve is normal in structure and function. No evidence of mitral valve regurgitation. No evidence of mitral stenosis.  4. The aortic valve is tricuspid. Aortic valve regurgitation is trivial. Mild aortic valve sclerosis is present, with no evidence of aortic valve stenosis.  5. The inferior vena cava is normal in size with greater than 50% respiratory variability, suggesting right atrial pressure of 3 mmHg. FINDINGS  Left Ventricle: Left ventricular ejection fraction, by estimation, is 60 to 65%. The left ventricle has normal function. The left ventricle has no regional wall motion abnormalities. The left ventricular internal cavity size was normal in size. There is  no left ventricular hypertrophy. Left ventricular diastolic parameters were normal. Normal left ventricular filling pressure. Right Ventricle: The right ventricular size is normal. No increase in right ventricular wall thickness. Right ventricular systolic function is normal. Left Atrium: Left atrial size was normal in size. Right Atrium: Right atrial size was normal in size. Pericardium: There is no evidence of pericardial effusion. Mitral Valve: The mitral valve is normal in structure and function. Normal mobility of the mitral valve leaflets. No evidence of mitral valve regurgitation. No evidence of mitral valve stenosis. Tricuspid Valve: The tricuspid valve is normal in structure. Tricuspid valve regurgitation is not demonstrated. No evidence of tricuspid stenosis. Aortic Valve: The aortic valve is tricuspid. Aortic valve regurgitation is trivial. Mild aortic valve sclerosis is present, with no evidence of aortic valve stenosis. Pulmonic Valve: The pulmonic valve was normal in structure. Pulmonic valve regurgitation is trivial. No evidence of pulmonic stenosis. Aorta: The aortic root is normal in size and structure. Venous: The inferior vena cava is normal in size with greater than 50% respiratory  variability, suggesting right atrial pressure of 3 mmHg. IAS/Shunts: No atrial level shunt detected by color flow Doppler.  LEFT VENTRICLE PLAX 2D LVIDd:         3.90 cm    Diastology LVIDs:         2.54 cm    LV e' lateral:   11.70 cm/s LVOT diam:     1.90 cm    LV E/e' lateral: 6.2 LV SV:         6747.98 ml LV e' medial:    8.70 cm/s LVOT Area:     2.84 cm   LV E/e' medial:  8.4  LEFT ATRIUM LA Vol (A2C): 42.8 ml LA Vol (A4C): 45.5 ml  AORTIC VALVE LVOT Vmax:   11100.00 cm/s LVOT Vmean:  7070.000 cm/s LVOT VTI:    23.800 m MITRAL VALVE MV Area (PHT): 10.54 cm     SHUNTS  MV Decel Time: 72 msec       Systemic VTI:  23.80 m MV E velocity: 73.00 cm/s    Systemic Diam: 1.90 cm MV A velocity: 9400.00 cm/s MV E/A ratio:  0.01 Armanda Magic MD Electronically signed by Armanda Magic MD Signature Date/Time: 11/04/2019/12:27:24 PM    Final     PHYSICAL EXAM  Temp:  [97.6 F (36.4 C)-98.7 F (37.1 C)] 98.7 F (37.1 C) (02/22 1146) Pulse Rate:  [52-85] 55 (02/22 1146) Resp:  [17-19] 18 (02/22 1400) BP: (95-146)/(48-90) 144/62 (02/22 1146) SpO2:  [97 %-100 %] 99 % (02/22 1146) Weight:  [106.5 kg] 106.5 kg (02/22 0000)  General - Well nourished, well developed, in no apparent distress.  Ophthalmologic - fundi not visualized due to noncooperation.  Cardiovascular - Regular rhythm and rate.  Mental Status -  Level of arousal and orientation to time, place, and person were intact. Language including expression, naming, repetition, comprehension was assessed and found intact. Fund of Knowledge was assessed and was intact.  Cranial Nerves II - XII - II - Visual field intact OU. III, IV, VI - Extraocular movements intact. V - Facial sensation intact bilaterally. VII - mild left nasolabial fold flattening. VIII - Hearing & vestibular intact bilaterally. X - Palate elevates symmetrically. XI - Chin turning & shoulder shrug intact bilaterally. XII - Tongue protrusion intact.  Motor Strength - The  patient's strength was symmetrical in all extremities except left upper extremity deltoid 4/5, tricep 4/5 with pronator drift was absent. BLE proximal 2/5 and distal 4/5. Bulk was normal and fasciculations were absent.   Motor Tone - Muscle tone was assessed at the neck and appendages and was normal.  Reflexes - The patient's reflexes were symmetrical in all extremities and she had no pathological reflexes.  Sensory - Light touch, temperature/pinprick were assessed and were symmetrical.    Coordination - The patient had normal movements in the hands with no ataxia or dysmetria.  Tremor was absent.  Gait and Station - deferred.   ASSESSMENT/PLAN Susan Fernandez is a 74 y.o. female with history of hyperlipidemia, hypertension, prediabetes presenting with vision changes and dizziness present on awakening.   Stroke:   R thalamic infarct likely secondary to small vessel disease source  CT head No acute abnormality.   MRI  Acute R thalamic lacune  CTA head & neck No significant atherosclerosis or LVO.   2D Echo EF 60-65%. No source of embolus   LDL 230  HgbA1c 5.9  Lovenox 40 mg sq daily for VTE prophylaxis  No antithrombotic prior to admission, now on aspirin 81 mg daily and clopidogrel 75 mg daily following plavix load.  Recommend DAPT for 3 weeks and then aspirin alone.  Therapy recommendations:  HH PT  Disposition:  Return home  Follow up stroke clinic in 4 weeks. Order placed.  Hypertension  Stable . Permissive hypertension (OK if < 220/120) but gradually normalize in 5-7 days . Long-term BP goal normotensive  Hyperlipidemia  Home meds:  No statin (had been on lipitor and crestor in the past)  Now on lipitor 80  LDL 230, goal < 70  Continue statin at discharge  PreDiabetes   HgbA1c 5.9, goal < 7.0  SSI  CBG monitoring  PCP follow up  Other Stroke Risk Factors  Advanced age  Obesity, Body mass index is 34.67 kg/m., recommend weight loss,  diet and exercise as appropriate   Other Active Problems  CKD stage IIIa, creatinine 1.25  Posterior L  knee pain  Hospital day # 0  Neurology will sign off. Please call with questions. Pt will follow up with stroke clinic NP at [facility] in about 4 weeks. Thanks for the consult.  Marvel Plan, MD PhD Stroke Neurology 11/04/2019 3:25 PM    To contact Stroke Continuity provider, please refer to [email or web address]. After hours, contact General Neurology

## 2019-11-04 NOTE — Plan of Care (Signed)
  Problem: Education: Goal: Knowledge of disease or condition will improve Outcome: Progressing Goal: Knowledge of secondary prevention will improve Outcome: Progressing Goal: Knowledge of patient specific risk factors addressed and post discharge goals established will improve Outcome: Progressing Goal: Individualized Educational Video(s) Outcome: Progressing   Problem: Coping: Goal: Will verbalize positive feelings about self Outcome: Progressing   Problem: Health Behavior/Discharge Planning: Goal: Ability to manage health-related needs will improve Outcome: Progressing   Problem: Ischemic Stroke/TIA Tissue Perfusion: Goal: Complications of ischemic stroke/TIA will be minimized Outcome: Progressing   Problem: Clinical Measurements: Goal: Ability to maintain clinical measurements within normal limits will improve Outcome: Progressing Goal: Diagnostic test results will improve Outcome: Progressing   Problem: Coping: Goal: Level of anxiety will decrease Outcome: Progressing   Problem: Safety: Goal: Ability to remain free from injury will improve Outcome: Progressing   Problem: Skin Integrity: Goal: Risk for impaired skin integrity will decrease Outcome: Progressing

## 2019-11-04 NOTE — Evaluation (Signed)
Occupational Therapy Evaluation Patient Details Name: Susan Fernandez MRN: [id number] DOB: [birth year] Today's Date: 11/04/2019    History of Present Illness This is a 75 year old female with past medical history of GERD, CKD 3, hypertension, hyperlipidemia, morbid obesity, fall 1 year ago leading to chronic dizziness, posterior vitreous detachment and cataracts who presented to the ED on 2/21 with acute on chronic onset dizziness, double vision, difficulty walking.SNK:[id number] lacunar infarct of right thalamus without associated hemorrhage or mass-effect   Clinical Impression   This 74 yo female admitted with above presents to acute OT PLOF being independent to Mod I with her doing less activity on days that she feels dizzy. She presents to acute OT at a minguard A when up on her feet as well as decreased visual fixation and tracking. She will benefit from acute OT with follow up [place] or OP OT but pt is declining both.     Follow Up Recommendations  Home health OT;Outpatient OT;Other (comment)((but pt declines any follow up services))          Precautions / Restrictions Precautions Precautions: Fall Restrictions Weight Bearing Restrictions: No      Mobility Bed Mobility Overal bed mobility: Modified Independent             General bed mobility comments: use of rails  Transfers Overall transfer level: Needs assistance Equipment used: Rolling walker (2 wheeled) Transfers: Sit to/from Stand Sit to Stand: Min guard              Balance Overall balance assessment: Mild deficits observed, not formally tested                                         ADL either performed or assessed with clinical judgement   ADL Overall ADL's : Needs assistance/impaired Eating/Feeding: Independent;Sitting   Grooming: Min guard;Standing   Upper Body Bathing: Supervision/ safety;Sitting;Standing   Lower Body Bathing: Min guard;Sit to/from stand   Upper Body Dressing  : Supervision/safety;Sitting;Standing   Lower Body Dressing: Min guard;Sit to/from stand   Toilet Transfer: Ambulation;RW;Grab bars;Regular Toilet;Min guard   Toileting- Water quality scientist and Hygiene: Min guard;Sit to/from stand Toileting - Clothing Manipulation Details (indicate cue type and reason): min A sit<>stand             Vision Baseline Vision/History: No visual deficits Patient Visual Report: No change from baseline Vision Assessment?: Yes Eye Alignment: (left eye lid droop but alignment good) Ocular Range of Motion: Within Functional Limits Alignment/Gaze Preference: Within Defined Limits Tracking/Visual Pursuits: Other (comment)(could move eyes in all directions, but at times would predict where pen would go,but I would not go there so she would loose track of it as well as she at times would merely loose track of it) Saccades: Additional eye shifts occurred during testing Convergence: Within functional limits Visual Fields: No apparent deficits Additional Comments: No seeing double today at all per her report. She did not undershot/overshot when picking up anything, she could read the clock (a real one not digital), she could read words on her board in her room            Pertinent Vitals/Pain Pain Assessment: No/denies pain     Hand Dominance Right   Extremity/Trunk Assessment Upper Extremity Assessment Upper Extremity Assessment: Overall WFL for tasks assessed   Lower Extremity Assessment Lower Extremity Assessment: Overall WFL for tasks assessed   Cervical / Trunk  Assessment Cervical / Trunk Assessment: Normal   Communication Communication Communication: No difficulties   Cognition Arousal/Alertness: Awake/alert Behavior During Therapy: WFL for tasks assessed/performed Overall Cognitive Status: Within Functional Limits for tasks assessed                                 General Comments: However pt reports she does drive --but only  when she feels she will not get dizzy. She also reported she does not get up at home when she is dizzy except to go to the bathroom which makes her a bit nervous (asked about a BSC for her to use and declined)              Home Living Family/patient expects to be discharged to:: Private residence Living Arrangements: Alone   Type of Home: House Home Access: Stairs to enter Secretary/administrator of Steps: 1   Home Layout: One level     Bathroom Shower/Tub: Tub/shower unit         Home Equipment: [employer] Mor - single point      Lives With: Alone    Prior Functioning/Environment Level of Independence: Independent                 OT Problem List: Impaired balance (sitting and/or standing);Impaired vision/perception      OT Treatment/Interventions: Self-care/ADL training;DME and/or AE instruction;Patient/family education;Balance training    OT Goals(Current goals can be found in the care plan section) Acute Rehab OT Goals Patient Stated Goal: to return home, feel better OT Goal Formulation: With patient Time For Goal Achievement: 11/18/19 Potential to Achieve Goals: Good  OT Frequency: Min 2X/week   Barriers to D/C: Decreased caregiver support          Co-evaluation PT/OT/SLP Co-Evaluation/Treatment: Yes Reason for Co-Treatment: For patient/therapist safety;To address functional/ADL transfers PT goals addressed during session: Mobility/safety with mobility;Balance;Proper use of DME OT goals addressed during session: ADL's and self-care      AM-PAC OT "6 Clicks" Daily Activity     Outcome Measure Help from another person eating meals?: None Help from another person taking care of personal grooming?: A Little Help from another person toileting, which includes using toliet, bedpan, or urinal?: A Little Help from another person bathing (including washing, rinsing, drying)?: A Little Help from another person to put on and taking off regular upper body clothing?: A  Little Help from another person to put on and taking off regular lower body clothing?: A Little 6 Click Score: 19   End of Session Equipment Utilized During Treatment: Gait belt;Rolling walker  Activity Tolerance: Patient tolerated treatment well Patient left: in bed;with call bell/phone within reach;with bed alarm set  OT Visit Diagnosis: Unsteadiness on feet (R26.81);Other abnormalities of gait and mobility (R26.89);Other (comment)(vision deficits)                Time: 0321-2248 OT Time Calculation (min): 27 min Charges:  OT General Charges $OT Visit: 1 Visit OT Evaluation $OT Eval Moderate Complexity: 1 Mod  Cathy  OTR/L Acute [employer] Pager [phone] Office [phone]     11/04/2019, 4:23 PM

## 2019-11-04 NOTE — Evaluation (Signed)
Speech Language Pathology Evaluation Patient Details Name: Susan Fernandez MRN: [id number] DOB: 26-Dec-1945 Today's Date: 11/04/2019 Time: 3267-1245 SLP Time Calculation (min) (ACUTE ONLY): 16 min  Problem List:  Patient Active Problem List   Diagnosis Date Noted  . Thalamic stroke ([place]) 11/03/2019  . Abnormal glucose 02/11/2019  . CKD (chronic kidney disease) stage 3, GFR 30-59 ml/min 06/21/2017  . Morbid obesity ([place]) 06/20/2017  . HTN (hypertension) 11/03/2015  . Hyperlipidemia 11/03/2015  . Vitamin D deficiency 11/03/2015  . Medication management 11/03/2015  . Combined form of senile cataract 10/29/2015   Past Medical History:  Past Medical History:  Diagnosis Date  . Cataract   . GERD (gastroesophageal reflux disease)   . Hyperlipidemia   . Hypertension 1995  . IBS (irritable bowel syndrome)   . Morbid obesity ([place])   . 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 Posterior vitreous detachment 10/29/2015  . Prediabetes 2016  . Venous insufficiency    Past Surgical History:  Past Surgical History:  Procedure Laterality Date  . CATARACT EXTRACTION, BILATERAL Bilateral 2018   L- 01/2017, R-02/2017  . RETINAL TEAR REPAIR CRYOTHERAPY Bilateral 2000   Dr. Ishmael Holter   HPI:  Susan Fernandez is a 74 y.o. female, PMH of hyperlipidemia, HTN, GERD, cataract, prediabetes presented to the ED with dizziness and vision change.  An MRI was performed that revealed a right thalamic infarct.   Assessment / Plan / Recommendation Clinical Impression   Patient presents with expressive and receptive language skills that are [facility]. No evidence of cognitive-communication impairment, dysarthria or motor speech impairment noted when seen today. Patient is alert, oriented x4. Her speech is clear and fluent with no articulatory imprecision or distortions. Patient able to complete automatic speech tasks, initiate verbalizations and engage in informal conversation. Patient able to answer basic and  complex yes/no questions, follow basic and multi-step verbal commands without difficulty and does not demonstrate or endorse word-finding problems. Portions of the cognistat administered to assist with assessment (see below for score details), patient scoring WFL on all portions given today. Further ST not indicated at this time, ST will sign-off. Please re-consult should any new needs arise.  COGNISTAT - all subtests are within the average range, except where otherwise specified Orientation: 12/12 Attention: not given Comprehension: not given Repetition: 11/12 Naming: 8/8 Construction: not given Memory: 11/12  Calculation: 3/4 Similarities: 7/8 Judgment: 5/6      SLP Assessment  SLP Recommendation/Assessment: Patient does not need any further Speech Lanaguage Pathology Services SLP Visit Diagnosis: Cognitive communication deficit (R41.841)    Follow Up Recommendations  None    Frequency and Duration           SLP Evaluation Cognition  Overall Cognitive Status: Within Functional Limits for tasks assessed Arousal/Alertness: Awake/alert Orientation Level: Oriented X4 Attention: Sustained Sustained Attention: Appears intact Memory: Appears intact Awareness: Appears intact Problem Solving: Appears intact Safety/Judgment: Appears intact       Comprehension  Auditory Comprehension Overall Auditory Comprehension: Appears within functional limits for tasks assessed Yes/No Questions: Within Functional Limits Commands: Within Functional Limits    Expression Expression Primary Mode of Expression: Verbal Verbal Expression Overall Verbal Expression: Appears within functional limits for tasks assessed Initiation: No impairment Automatic Speech: Counting Level of Generative/Spontaneous Verbalization: Conversation Repetition: No impairment Naming: No impairment Written Expression Dominant Hand: Right Written Expression: Not tested   Oral / Motor  Oral Motor/Sensory  Function Overall Oral Motor/Sensory Function: Within functional limits Motor Speech Overall Motor Speech: Appears within functional limits for  tasks assessed   GO                    Shella Spearing 11/04/2019, 11:30 AM  Shella Spearing, M.Ed., CCC-SLP Speech Therapy Acute Rehabilitation [phone]: Acute Rehab office [phone] - pager

## 2019-11-04 NOTE — Progress Notes (Signed)
Progress Note    Susan Fernandez  HXT:[id number] DOB: 1945/10/29  DOA: 11/03/2019 PCP: [name], MD    Brief Narrative:   Chief complaint: dizzy  Medical records reviewed and are as summarized below:  Susan Fernandez is an 74 y.o. female with a past medical history that includes chronic kidney disease stage III, hypertension, hyperlipidemia, morbid obesity GERD, chronic dizziness, posterior vitreous detachment of cataracts presents to the emergency department February 21 with acute on chronic dizziness associated with double vision.  Work-up in the emergency department included an MRI of the brain which showed acute lacunar infarct of the right thalamus without associated hemorrhage or mass-effect.  Assessment/Plan:   Principal Problem:   Thalamic stroke (HCC) Active Problems:   HTN (hypertension)   CKD (chronic kidney disease) stage 3, GFR 30-59 ml/min   Posterior left knee pain   Hyperlipidemia   Morbid obesity (HCC)  #1.  Acute lacunar infarct in the right thalamus.  MRI as noted above.  Echo with an EF of 60 to 65% with no regional wall motion abnormalities, CT angio head and neck pending, with cholesterol 292, HDL 40, LDL 230, hemoglobin A1c 5.9.  Evaluated by physical therapy who recommend home health and patient declines, aspirin and Plavix started. -Continue aspirin and Plavix -Continue statin -Frequent neuro checks -Follow CTA results -Await final recommendations from neuro  #2.  Hypertension.  Controlled.  Home medications include losartan, bisoprolol, hydrochlorothiazide. -Holding home meds for now -Monitor blood pressure closely -Resume home medications as indicated  #3.  Chronic kidney disease stage III.  Chart review indicates baseline creatinine 1.1-1.2 range.  A little above baseline on admission at 1.3.  This morning creatinine 1.25 -Hold nephrotoxins -Monitor urine output  #4.  Posterior left knee pain.  Patient does complain of chronic  pain in both legs/hips but states when she slid off the commode she slid onto her left knee and complained of pain.  Area without swelling/erythema -Plain x-ray to evaluate for injury -Tylenol  5.  Hyperlipidemia.  -See #1  #6.Morbid obesity. BMI 34.6    Family Communication/Anticipated D/C date and plan/Code Status   DVT prophylaxis: Lovenox ordered. Code Status: Full Code.  Family Communication: patient Disposition Plan: home when ready    Medical Consultants:       Anti-Infectives:    None  Subjective:  Kirkpatrick neuro  Objective:    Vitals:   11/04/19 0000 11/04/19 0314 11/04/19 0734 11/04/19 1146  BP: (!) 141/66 106/90 (!) 133/51 (!) 144/62  Pulse: (!) 55 60 (!) 55 (!) 55  Resp: 18 19 18 18   Temp: 98.7 F (37.1 C) 98.5 F (36.9 C) 98.3 F (36.8 C) 98.7 F (37.1 C)  TempSrc: Oral Oral Oral Oral  SpO2: 99% 98% 100% 99%  Weight: 106.5 kg     Height: 5\' 9"  (1.753 m)       Intake/Output Summary (Last 24 hours) at 11/04/2019 1328 Last data filed at 11/04/2019 1308 Gross per 24 hour  Intake 240 ml  Output 350 ml  Net -110 ml   Filed Weights   11/04/19 0000  Weight: 106.5 kg    Exam: General: Awake alert well-nourished no acute distress CV regular rate and rhythm no murmur gallop or rub no lower extremity edema Respiratory: No increased work of breathing breath sounds are clear bilaterally throughout I hear no wheezes no crackles Abdomen: Obese soft positive bowel sounds throughout nontender to palpation no guarding or rebounding Musculoskeletal joints without swelling/erythema left knee mildly  tender to palpation no swelling decreased range of motion in that left knee due to pain.  No erythema no heat Neuro: Awake alert oriented x3 speech clear facial symmetry bilateral grip 5 out of 5  Data Reviewed:   I have personally reviewed following labs and imaging studies:  Labs: Labs show the following:   Basic Metabolic Panel: Recent Labs  Lab  11/03/19 1156 11/03/19 1156 11/03/19 1207 11/03/19 1207 11/03/19 1211 11/04/19 0341  NA 131*  --  135  --  136 136  K 6.8*   < > 4.2   < > 4.1 3.7  CL 105  --  101  --  101 100  CO2  --   --  24  --   --  25  GLUCOSE 109*  --  108*  --  105* 111*  BUN 14  --  10  --  11 9  CREATININE 1.20*  --  1.29*  --  1.30* 1.25*  CALCIUM  --   --  8.9  --   --  8.7*   < > = values in this interval not displayed.   GFR Estimated Creatinine Clearance: 53.6 mL/min (A) (by C-G formula based on SCr of 1.25 mg/dL (H)). Liver Function Tests: Recent Labs  Lab 11/03/19 1207  AST 17  ALT 12  ALKPHOS 64  BILITOT 0.5  PROT 7.3  ALBUMIN 3.1*   No results for input(s): LIPASE, AMYLASE in the last 168 hours. No results for input(s): AMMONIA in the last 168 hours. Coagulation profile Recent Labs  Lab 11/03/19 1207  INR 1.1    CBC: Recent Labs  Lab 11/03/19 1154 11/03/19 1156 11/03/19 1207 11/03/19 1211 11/04/19 0341  WBC DUPLICATE REQUEST  --  5.2  --  6.0  NEUTROABS DUPLICATE REQUEST  --  3.4  --   --   HGB DUPLICATE REQUEST 12.9 12.5 12.9 11.4*  HCT DUPLICATE REQUEST 38.0 39.8 38.0 35.1*  MCV DUPLICATE REQUEST  --  100.8*  --  96.4  PLT DUPLICATE REQUEST  --  249  --  247   Cardiac Enzymes: No results for input(s): CKTOTAL, CKMB, CKMBINDEX, TROPONINI in the last 168 hours. BNP (last 3 results) No results for input(s): PROBNP in the last 8760 hours. CBG: Recent Labs  Lab 11/03/19 1114  GLUCAP 98   D-Dimer: No results for input(s): DDIMER in the last 72 hours. Hgb A1c: Recent Labs    11/03/19 1207  HGBA1C 5.9*   Lipid Profile: Recent Labs    11/03/19 1207  CHOL 292*  HDL 40*  LDLCALC 230*  TRIG 111  CHOLHDL 7.3   Thyroid function studies: No results for input(s): TSH, T4TOTAL, T3FREE, THYROIDAB in the last 72 hours.  Invalid input(s): FREET3 Anemia work up: No results for input(s): VITAMINB12, FOLATE, FERRITIN, TIBC, IRON, RETICCTPCT in the last 72  hours. Sepsis Labs: Recent Labs  Lab 11/03/19 1154 11/03/19 1207 11/04/19 0341  WBC DUPLICATE REQUEST 5.2 6.0    Microbiology Recent Results (from the past 240 hour(s))  SARS CORONAVIRUS 2 (TAT 6-24 HRS) Nasopharyngeal Nasopharyngeal Swab     Status: None   Collection Time: 11/03/19  7:23 PM   Specimen: Nasopharyngeal Swab  Result Value Ref Range Status   SARS Coronavirus 2 NEGATIVE NEGATIVE Final    Comment: (NOTE) SARS-CoV-2 target nucleic acids are NOT DETECTED. The SARS-CoV-2 RNA is generally detectable in upper and lower respiratory specimens during the acute phase of infection. Negative results do not preclude SARS-CoV-2  infection, do not rule out co-infections with other pathogens, and should not be used as the sole basis for treatment or other patient management decisions. Negative results must be combined with clinical observations, patient history, and epidemiological information. The expected result is Negative. Fact Sheet for Patients: SugarRoll.be Fact Sheet for Healthcare Providers: https://www.woods-mathews.com/ This test is not yet approved or cleared by the [place] FDA and  has been authorized for detection and/or diagnosis of SARS-CoV-2 by FDA under an Emergency Use Authorization (EUA). This EUA will remain  in effect (meaning this test can be used) for the duration of the COVID-19 declaration under Section 56 4(b)(1) of the Act, 21 U.S.C. section 360bbb-3(b)(1), unless the authorization is terminated or revoked sooner. Performed at [facility], [place] [address], [place], [place] [address]     Procedures and diagnostic studies:  DG Knee 1-2 Views Left  Result Date: 11/04/2019 CLINICAL DATA:  Posterior left knee pain since a fall yesterday. Initial encounter. EXAM: LEFT KNEE - 1-2 VIEW COMPARISON:  None. FINDINGS: There is no acute bony or joint abnormality. Tricompartmental osteophytes are present. No  joint effusion or chondrocalcinosis. IMPRESSION: No acute abnormality. Osteoarthritis. Electronically Signed   By: Inge Rise M.D.   On: 11/04/2019 12:46   CT HEAD WO CONTRAST  Result Date: 11/03/2019 CLINICAL DATA:  Onset dizziness and unsteady gait today. EXAM: CT HEAD WITHOUT CONTRAST TECHNIQUE: Contiguous axial images were obtained from the base of the skull through the vertex without intravenous contrast. COMPARISON:  None. FINDINGS: Brain: No evidence of acute infarction, hemorrhage, hydrocephalus, extra-axial collection or mass lesion/mass effect. Cortical atrophy is noted. Vascular: No hyperdense vessel or unexpected calcification. Skull: Intact.  No focal lesion. Sinuses/Orbits: Status post cataract surgery.  Otherwise negative. Other: None. IMPRESSION: No acute abnormality. Cortical atrophy. Electronically Signed   By: Inge Rise M.D.   On: 11/03/2019 12:59   MR Brain Wo Contrast (neuro protocol)  Result Date: 11/03/2019 CLINICAL DATA:  74 year old female with dizziness upon waking today. Bradycardia. EXAM: MRI HEAD WITHOUT CONTRAST TECHNIQUE: Multiplanar, multiecho pulse sequences of the brain and surrounding structures were obtained without intravenous contrast. COMPARISON:  Head CT earlier today. FINDINGS: Brain: There is mildly asymmetric subarachnoid space CSF redemonstrated along the left anterior frontal convexity. No evidence of extra-axial blood or collection. No areas of disproportion cerebral atrophy identified. Confluent 12 mm focus of restricted diffusion in the anteromedial right thalamus on series 5, image 72. Associated faint T2 and FLAIR hyperintensity. No hemorrhage or mass effect. No other restricted diffusion. And elsewhere gray and white matter signal is normal for age throughout the brain. No cortical encephalomalacia or chronic cerebral blood products. No midline shift, mass effect, evidence of mass lesion, ventriculomegaly, extra-axial collection or acute  intracranial hemorrhage. Cervicomedullary junction and pituitary are within normal limits. Vascular: Major intracranial vascular flow voids are preserved. Skull and upper cervical spine: Negative for age visible cervical spine. Normal bone marrow signal. Sinuses/Orbits: Postoperative changes to both globes otherwise negative orbits. Trace paranasal sinus mucosal thickening, small fluid level in the right sphenoid sinus. Other: Mastoids are well pneumatized. Visible internal auditory structures appear normal. Normal stylomastoid foramina. IMPRESSION: 1. Acute lacunar infarct in the right thalamus. No associated hemorrhage or mass effect. 2. But elsewhere normal for age noncontrast MRI appearance of the brain. Electronically Signed   By: Genevie Ann M.D.   On: 11/03/2019 15:41   ECHOCARDIOGRAM COMPLETE  Result Date: 11/04/2019    ECHOCARDIOGRAM REPORT   Patient Name:  Susan Fernandez Date of Exam: 11/04/2019 Medical Rec #:  [id number]           Height: Accession #:    [id number]          Weight: Date of Birth:  08/17/1946           BSA: Patient Age:    73 years            BP:           144/62 mmHg Patient Gender: F                   HR:           54 bpm. Exam Location:  Inpatient Procedure: 2D Echo Indications:    Stroke 434.91 / I163.9  History:        Patient has no prior history of Echocardiogram examinations.                 Risk Factors:Hypertension, Dyslipidemia and Obesity. CKD.  Sonographer:    Leeroy Bock Turrentine Referring Phys: [id number] DANIEL V THOMPSON IMPRESSIONS  1. Left ventricular ejection fraction, by estimation, is 60 to 65%. The left ventricle has normal function. The left ventricle has no regional wall motion abnormalities. Left ventricular diastolic parameters were normal.  2. Right ventricular systolic function is normal. The right ventricular size is normal.  3. The mitral valve is normal in structure and function. No evidence of mitral valve regurgitation. No evidence of mitral stenosis.  4. The  aortic valve is tricuspid. Aortic valve regurgitation is trivial. Mild aortic valve sclerosis is present, with no evidence of aortic valve stenosis.  5. The inferior vena cava is normal in size with greater than 50% respiratory variability, suggesting right atrial pressure of 3 mmHg. FINDINGS  Left Ventricle: Left ventricular ejection fraction, by estimation, is 60 to 65%. The left ventricle has normal function. The left ventricle has no regional wall motion abnormalities. The left ventricular internal cavity size was normal in size. There is  no left ventricular hypertrophy. Left ventricular diastolic parameters were normal. Normal left ventricular filling pressure. Right Ventricle: The right ventricular size is normal. No increase in right ventricular wall thickness. Right ventricular systolic function is normal. Left Atrium: Left atrial size was normal in size. Right Atrium: Right atrial size was normal in size. Pericardium: There is no evidence of pericardial effusion. Mitral Valve: The mitral valve is normal in structure and function. Normal mobility of the mitral valve leaflets. No evidence of mitral valve regurgitation. No evidence of mitral valve stenosis. Tricuspid Valve: The tricuspid valve is normal in structure. Tricuspid valve regurgitation is not demonstrated. No evidence of tricuspid stenosis. Aortic Valve: The aortic valve is tricuspid. Aortic valve regurgitation is trivial. Mild aortic valve sclerosis is present, with no evidence of aortic valve stenosis. Pulmonic Valve: The pulmonic valve was normal in structure. Pulmonic valve regurgitation is trivial. No evidence of pulmonic stenosis. Aorta: The aortic root is normal in size and structure. Venous: The inferior vena cava is normal in size with greater than 50% respiratory variability, suggesting right atrial pressure of 3 mmHg. IAS/Shunts: No atrial level shunt detected by color flow Doppler.  LEFT VENTRICLE PLAX 2D LVIDd:         3.90 cm     Diastology LVIDs:         2.54 cm    LV e' lateral:   11.70 cm/s LVOT diam:     1.90 cm    LV E/e'  lateral: 6.2 LV SV:         6747.98 ml LV e' medial:    8.70 cm/s LVOT Area:     2.84 cm   LV E/e' medial:  8.4  LEFT ATRIUM LA Vol (A2C): 42.8 ml LA Vol (A4C): 45.5 ml  AORTIC VALVE LVOT Vmax:   11100.00 cm/s LVOT Vmean:  7070.000 cm/s LVOT VTI:    23.800 m MITRAL VALVE MV Area (PHT): 10.54 cm     SHUNTS MV Decel Time: 72 msec       Systemic VTI:  23.80 m MV E velocity: 73.00 cm/s    Systemic Diam: 1.90 cm MV A velocity: 9400.00 cm/s MV E/A ratio:  0.01 Armanda Magic MD Electronically signed by Armanda Magic MD Signature Date/Time: 11/04/2019/12:27:24 PM    Final     Medications:   . acetaminophen  650 mg Oral Q6H  . amLODipine  5 mg Oral Daily  . aspirin EC  81 mg Oral Daily  . atorvastatin  40 mg Oral q1800  . clopidogrel  75 mg Oral Daily  . enoxaparin (LOVENOX) injection  40 mg Subcutaneous Q24H   Continuous Infusions:   LOS: 0 days   Gwenyth Bender NP  [facility]   How to contact the [facility] Attending or Consulting provider 7A - 7P or covering provider during after hours 7P -7A, for this patient?  1. Check the care team in [facility] and look for a) attending/consulting [facility] provider listed and b) the [facility] team listed 2. Log into [email or web address] and use [facility]'s universal password to access. If you do not have the password, please contact the hospital operator. 3. Locate the [facility] provider you are looking for under [facility] and page to a number that you can be directly reached. 4. If you still have difficulty reaching the provider, please page the [facility] (Director on Call) for the Hospitalists listed on amion for assistance.  11/04/2019, 1:28 PM

## 2019-11-04 NOTE — Progress Notes (Signed)
  Echocardiogram 2D Echocardiogram has been performed.  Susan Fernandez [name] 11/04/2019, 10:47 AM

## 2019-11-04 NOTE — Evaluation (Addendum)
Physical Therapy Evaluation Patient Details Name: Susan Fernandez MRN: [id number] DOB: [birth year] Today's Date: 11/04/2019   History of Present Illness  This is a 74 year old female with past medical history of GERD, CKD 3, hypertension, hyperlipidemia, morbid obesity, fall 1 year ago leading to chronic dizziness, posterior vitreous detachment and cataracts who presented to the ED on 2/21 with acute on chronic onset dizziness, double vision, difficulty walking.[id number] lacunar infarct of right thalamus without associated hemorrhage or mass-effect  Clinical Impression  Patient received in bed, agrees to PT assessment. Patient reports her head is feeling funny, denies dizziness. Patient reports she does not use AD at home. She performed bed mobility with min guard/supervision, transfers with min guard and ambulated 100 feet with rw. Cues for correct use. Patient wanted to return to room and bed after this distance. Patient was able to go to bathroom, don undergarments and stand at sink without UE support and no difficulties with balance. Patient will continue to benefit from skilled PT while here to improve activity tolerance and safety with mobility for safe return home.     Follow Up Recommendations Home health PT(patient does not want)    Equipment Recommendations  None recommended by PT    Recommendations for Other Services       Precautions / Restrictions Precautions Precautions: Fall Restrictions Weight Bearing Restrictions: No      Mobility  Bed Mobility Overal bed mobility: Modified Independent             General bed mobility comments: use of rails  Transfers Overall transfer level: Needs assistance Equipment used: Rolling walker (2 wheeled) Transfers: Sit to/from Stand Sit to Stand: Min guard            Ambulation/Gait Ambulation/Gait assistance: Min guard Gait Distance (Feet): 100 Feet Assistive device: Rolling walker (2 wheeled) Gait  Pattern/deviations: Step-through pattern;Decreased stride length Gait velocity: decr   General Gait Details: steady with mobility, however reports not feeling right, wishing to get back in bed.  Stairs            Wheelchair Mobility    Modified Rankin (Stroke Patients Only) Modified Rankin (Stroke Patients Only) Pre-Morbid Rankin Score: No symptoms Modified Rankin: Slight disability     Balance Overall balance assessment: Mild deficits observed, not formally tested                                           Pertinent Vitals/Pain Pain Assessment: No/denies pain    Home Living Family/patient expects to be discharged to:: Private residence Living Arrangements: Alone   Type of Home: House Home Access: Stairs to enter   Technical brewer of Steps: 1 Home Layout: One level Home Equipment: Cane - single point      Prior Function Level of Independence: Independent               Hand Dominance   Dominant Hand: Right    Extremity/Trunk Assessment   Upper Extremity Assessment Upper Extremity Assessment: Overall WFL for tasks assessed    Lower Extremity Assessment Lower Extremity Assessment: Overall WFL for tasks assessed    Cervical / Trunk Assessment Cervical / Trunk Assessment: Normal  Communication   Communication: No difficulties  Cognition Arousal/Alertness: Awake/alert Behavior During Therapy: WFL for tasks assessed/performed Overall Cognitive Status: Within Functional Limits for tasks assessed  General Comments      Exercises     Assessment/Plan    PT Assessment Patient needs continued PT services  PT Problem List Decreased mobility;Decreased activity tolerance;Obesity       PT Treatment Interventions Therapeutic exercise;DME instruction;Gait training;Stair training;Functional mobility training;Therapeutic activities;Patient/family education;Balance training     PT Goals (Current goals can be found in the Care Plan section)  Acute Rehab PT Goals Patient Stated Goal: to return home, feel better PT Goal Formulation: With patient Time For Goal Achievement: 11/18/19 Potential to Achieve Goals: Good    Frequency Min 4X/week   Barriers to discharge Decreased caregiver support      Co-evaluation PT/OT/SLP Co-Evaluation/Treatment: Yes Reason for Co-Treatment: For patient/therapist safety;To address functional/ADL transfers PT goals addressed during session: Mobility/safety with mobility;Balance;Proper use of DME         AM-PAC PT "6 Clicks" Mobility  Outcome Measure Help needed turning from your back to your side while in a flat bed without using bedrails?: None Help needed moving from lying on your back to sitting on the side of a flat bed without using bedrails?: A Little Help needed moving to and from a bed to a chair (including a wheelchair)?: A Little Help needed standing up from a chair using your arms (e.g., wheelchair or bedside chair)?: A Little Help needed to walk in hospital room?: A Little Help needed climbing 3-5 steps with a railing? : A Little 6 Click Score: 19    End of Session Equipment Utilized During Treatment: Gait belt Activity Tolerance: Patient tolerated treatment well Patient left: in bed;with call bell/phone within reach;with bed alarm set Nurse Communication: Mobility status PT Visit Diagnosis: Other abnormalities of gait and mobility (R26.89);History of falling (Z91.81);Muscle weakness (generalized) (M62.81);Other symptoms and signs involving the nervous system (R29.898)    Time: 9371-6967 PT Time Calculation (min) (ACUTE ONLY): 29 min   Charges:   PT Evaluation $PT Eval Moderate Complexity: 1 Mod PT Treatments $Gait Training: 8-22 mins        Bess Saltzman, PT, GCS 11/04/19,1:21 PM

## 2019-11-05 DIAGNOSIS — N390 Urinary tract infection, site not specified: Secondary | ICD-10-CM

## 2019-11-05 LAB — CBC
HCT: 34.6 % — ABNORMAL LOW (ref 36.0–46.0)
Hemoglobin: 11 g/dL — ABNORMAL LOW (ref 12.0–15.0)
MCH: 30.9 pg (ref 26.0–34.0)
MCHC: 31.8 g/dL (ref 30.0–36.0)
MCV: 97.2 fL (ref 80.0–100.0)
Platelets: 251 10*3/uL (ref 150–400)
RBC: 3.56 MIL/uL — ABNORMAL LOW (ref 3.87–5.11)
RDW: 12.9 % (ref 11.5–15.5)
WBC: 4.7 10*3/uL (ref 4.0–10.5)
nRBC: 0 % (ref 0.0–0.2)

## 2019-11-05 LAB — POCT I-STAT, CHEM 8
BUN: 14 mg/dL (ref 8–23)
Calcium, Ion: 0.92 mmol/L — ABNORMAL LOW (ref 1.15–1.40)
Chloride: 105 mmol/L (ref 98–111)
Creatinine, Ser: 1.2 mg/dL — ABNORMAL HIGH (ref 0.44–1.00)
Glucose, Bld: 109 mg/dL — ABNORMAL HIGH (ref 70–99)
HCT: 38 % (ref 36.0–46.0)
Hemoglobin: 12.9 g/dL (ref 12.0–15.0)
Potassium: 6.8 mmol/L (ref 3.5–5.1)
Sodium: 131 mmol/L — ABNORMAL LOW (ref 135–145)
TCO2: 27 mmol/L (ref 22–32)

## 2019-11-05 LAB — BASIC METABOLIC PANEL
Anion gap: 9 (ref 5–15)
BUN: 12 mg/dL (ref 8–23)
CO2: 26 mmol/L (ref 22–32)
Calcium: 8.5 mg/dL — ABNORMAL LOW (ref 8.9–10.3)
Chloride: 102 mmol/L (ref 98–111)
Creatinine, Ser: 1.31 mg/dL — ABNORMAL HIGH (ref 0.44–1.00)
GFR calc Af Amer: 47 mL/min — ABNORMAL LOW (ref >60)
GFR calc non Af Amer: 41 mL/min — ABNORMAL LOW (ref >60)
Glucose, Bld: 114 mg/dL — ABNORMAL HIGH (ref 70–99)
Potassium: 3.7 mmol/L (ref 3.5–5.1)
Sodium: 137 mmol/L (ref 135–145)

## 2019-11-05 LAB — GLUCOSE, CAPILLARY: Glucose-Capillary: 100 mg/dL — ABNORMAL HIGH (ref 70–99)

## 2019-11-05 MED ORDER — ATORVASTATIN CALCIUM 80 MG PO TABS: 80.0000 mg | ORAL_TABLET | Freq: Every day | ORAL | 0 refills | Status: DC

## 2019-11-05 MED ORDER — AMLODIPINE BESYLATE 5 MG PO TABS: 5.0000 mg | ORAL_TABLET | Freq: Every day | ORAL | 0 refills | Status: DC

## 2019-11-05 MED ORDER — ASPIRIN 81 MG PO TBEC: 81.0000 mg | DELAYED_RELEASE_TABLET | Freq: Every day | ORAL | Status: DC

## 2019-11-05 MED ORDER — CLOPIDOGREL BISULFATE 75 MG PO TABS: 75.0000 mg | ORAL_TABLET | Freq: Every day | ORAL | 0 refills | Status: AC

## 2019-11-05 MED ORDER — CEFDINIR 300 MG PO CAPS: 300.0000 mg | ORAL_CAPSULE | Freq: Two times a day (BID) | ORAL | 0 refills | Status: AC

## 2019-11-05 MED ORDER — CIPROFLOXACIN HCL 500 MG PO TABS: 500.0000 mg | ORAL_TABLET | Freq: Two times a day (BID) | ORAL | Status: DC

## 2019-11-05 MED ORDER — CEFDINIR 300 MG PO CAPS
300.0000 mg | ORAL_CAPSULE | Freq: Two times a day (BID) | ORAL | Status: DC
  Administered 2019-11-05: 300 mg via ORAL
  Filled 2019-11-05 (×2): qty 1

## 2019-11-05 NOTE — Discharge Summary (Addendum)
Physician Discharge Summary  Sherrian DiversVictoria Simson ZOX:[id number] DOB: [birth year] DOA: 11/03/2019  PCP: Lucky CowboyMcKeown, William, MD  Admit date: 11/03/2019 Discharge date: 11/05/2019  Admitted From: home Discharge disposition: home   Recommendations for Outpatient Follow-Up:   1. Follow up with neurology as scheduled  2. Follow up with PCP 2-3 weeks for evaluation of BP control and resolution of UTI   Discharge Diagnosis:   Principal Problem:   Thalamic stroke (HCC) Active Problems:   HTN (hypertension)   CKD (chronic kidney disease) stage 3, GFR 30-59 ml/min   Posterior left knee pain   UTI (urinary tract infection)   Hyperlipidemia   Morbid obesity (HCC)    Discharge Condition: Improved.  Diet recommendation: Low sodium, heart healthy.    Wound care: None.  Code status: Full.   History of Present Illness:   This is a 74 year old female with past medical history of GERD, CKD 3, hypertension, hyperlipidemia, morbid obesity, fall 1 year ago leading to chronic dizziness, posterior vitreous detachment and cataracts who presented to the ED on 2/21 with acute on chronic onset dizziness and double vision.  Last known normal at 9:30 PM night prior, awoke at 6 AM with stated issues.  Was having difficulty with walking at home needing to hang onto things.  Called her friend who insisted to come to the ED.  CT head without contrast without acute abnormality.   MRI brain in ED which showed acute lacunar infarct of right thalamus without associated hemorrhage or mass-effect.  Neuro hospitalist was consulted.  She stated that she was on the commode at home when she slipped off onto the floor due to the dizziness. Did not hit her head, did not have LOC. Denied tobacco, alcohol use. Stated she had not been taking her statin for 2 months at least as she ran out. Only takes Bisoprolol antihypertensive.     Hospital Course by Problem:   1.  Acute lacunar infarct right thalamus Per  MRI.  CT head done negative for any acute abnormalities.  MRI with acute right thalamic lacunar infarct.  CT angiogram head and neck pending.  2D echo done with a EF of 60 to 65% with no source of emboli.  Fasting lipid panel with LDL of 230.  Hemoglobin A1c of 5.9.  Patient seen in consultation by neurology who are recommending dual antiplatelet therapy with aspirin and Plavix x3 weeks and then Aspirin only.  PT/OT/ST recommended HH which is being arranged. Follow up with neuro as scheduled  2.  Hypertension. Fair control. Home meds included losartan, bisoprolol, HCTZ which were held initially for permissive hypertension. Amlodipine started during hospitalization with good control. At discharge will continue amlodipine, stop losartan and bisoprolol and hctz with close follow up with PCP for BP control.  3.  Hyperlipidemia LDL of 230.  Statin.  4.  Chronic kidney disease stage IIIa Stable.   5, UTI. Culture pending. Discharge with 3 days of omnicef. Follow up with PCP   Medical Consultants:   Dr. Roda ShuttersXu neurology   Discharge Exam:   Vitals:   11/05/19 0346 11/05/19 0804  BP: (!) 136/92 (!) 138/54  Pulse: 73 (!) 52  Resp: 19 20  Temp: 98.2 F (36.8 C) 98.3 F (36.8 C)  SpO2: 96% 98%   Vitals:   11/04/19 1953 11/05/19 0006 11/05/19 0346 11/05/19 0804  BP: (!) 143/55 (!) 127/53 (!) 136/92 (!) 138/54  Pulse: (!) 59 (!) 54 73 (!) 52  Resp: 18 17 19  20  Temp: 97.9 F (36.6 C) 98.2 F (36.8 C) 98.2 F (36.8 C) 98.3 F (36.8 C)  TempSrc: Oral Oral Oral Oral  SpO2: 99% 96% 96% 98%  Weight:      Height:        General exam: Appears calm and comfortable.  Respiratory system: Clear to auscultation. Respiratory effort normal. Cardiovascular system: S1 & S2 heard, RRR. No JVD,  rubs, gallops or clicks. No murmurs. Gastrointestinal system: Abdomen is nondistended, soft and nontender. No organomegaly or masses felt. Normal bowel sounds heard. Central nervous system: Alert and oriented. No  focal neurological deficits. Extremities: No clubbing,  or cyanosis. No edema. Skin: No rashes, lesions or ulcers. Psychiatry: Judgement and insight appear normal. Mood & affect appropriate.    The results of significant diagnostics from this hospitalization (including imaging, microbiology, ancillary and laboratory) are listed below for reference.     Procedures and Diagnostic Studies:   CT ANGIO HEAD W OR WO CONTRAST  Result Date: 11/04/2019 CLINICAL DATA:  Stroke EXAM: CT ANGIOGRAPHY HEAD AND NECK TECHNIQUE: Multidetector CT imaging of the head and neck was performed using the standard protocol during bolus administration of intravenous contrast. Multiplanar CT image reconstructions and MIPs were obtained to evaluate the vascular anatomy. Carotid stenosis measurements (when applicable) are obtained utilizing NASCET criteria, using the distal internal carotid diameter as the denominator. CONTRAST:  100mL OMNIPAQUE IOHEXOL 350 MG/ML SOLN COMPARISON:  CT head 11/03/2019.  MRI head 11/03/2019 FINDINGS: CT HEAD FINDINGS Brain: Interval development of hypodensity right medial thalamus compatible with acute infarct as noted on MRI. No associated hemorrhage. No other infarct. No hemorrhage or mass. Ventricle size normal. Small subdural hygroma left frontal lobe unchanged. Vascular: Negative for hyperdense vessel Skull: Negative Sinuses: Air-fluid level sphenoid sinus. Mild mucosal edema remaining paranasal sinuses. Orbits: Bilateral cataract surgery.  No mass lesion. Review of the MIP images confirms the above findings CTA NECK FINDINGS Aortic arch: Standard branching. Imaged portion shows no evidence of aneurysm or dissection. No significant stenosis of the major arch vessel origins. Atherosclerotic calcification aortic arch. Right carotid system: Mild atherosclerotic disease right carotid bifurcation without stenosis Left carotid system: Mild atherosclerotic disease left carotid bifurcation without  stenosis. Vertebral arteries: Small vertebral arteries are patent to the basilar without stenosis. Skeleton: Degenerative changes cervical spine. No acute skeletal abnormality. Other neck: Negative for mass or adenopathy Upper chest: Lung apices clear bilaterally. Review of the MIP images confirms the above findings CTA HEAD FINDINGS Anterior circulation: Atherosclerotic calcification in the cavernous carotid bilaterally without significant stenosis. Anterior and middle cerebral arteries patent bilaterally without stenosis. Posterior circulation: Both vertebral arteries patent to the basilar. PICA patent bilaterally. Basilar widely patent. Superior cerebellar and posterior cerebral arteries patent bilaterally. Fetal origin of the posterior cerebral artery bilaterally. Venous sinuses: Negative Anatomic variants: None Review of the MIP images confirms the above findings IMPRESSION: 1. Acute infarct right thalamus.  Negative for hemorrhage. 2. No significant carotid or vertebral artery stenosis in the neck. Mild atherosclerotic disease in the carotid bifurcation bilaterally 3. Negative for intracranial large vessel occlusion. Electronically Signed   By: Marlan Palauharles  Clark M.D.   On: 11/04/2019 13:39   DG Knee 1-2 Views Left  Result Date: 11/04/2019 CLINICAL DATA:  Posterior left knee pain since a fall yesterday. Initial encounter. EXAM: LEFT KNEE - 1-2 VIEW COMPARISON:  None. FINDINGS: There is no acute bony or joint abnormality. Tricompartmental osteophytes are present. No joint effusion or chondrocalcinosis. IMPRESSION: No acute abnormality. Osteoarthritis. Electronically Signed  By: Drusilla Kanner M.D.   On: 11/04/2019 12:46   CT HEAD WO CONTRAST  Result Date: 11/03/2019 CLINICAL DATA:  Onset dizziness and unsteady gait today. EXAM: CT HEAD WITHOUT CONTRAST TECHNIQUE: Contiguous axial images were obtained from the base of the skull through the vertex without intravenous contrast. COMPARISON:  None. FINDINGS:  Brain: No evidence of acute infarction, hemorrhage, hydrocephalus, extra-axial collection or mass lesion/mass effect. Cortical atrophy is noted. Vascular: No hyperdense vessel or unexpected calcification. Skull: Intact.  No focal lesion. Sinuses/Orbits: Status post cataract surgery.  Otherwise negative. Other: None. IMPRESSION: No acute abnormality. Cortical atrophy. Electronically Signed   By: Drusilla Kanner M.D.   On: 11/03/2019 12:59   CT ANGIO NECK W OR WO CONTRAST  Result Date: 11/04/2019 CLINICAL DATA:  Stroke EXAM: CT ANGIOGRAPHY HEAD AND NECK TECHNIQUE: Multidetector CT imaging of the head and neck was performed using the standard protocol during bolus administration of intravenous contrast. Multiplanar CT image reconstructions and MIPs were obtained to evaluate the vascular anatomy. Carotid stenosis measurements (when applicable) are obtained utilizing NASCET criteria, using the distal internal carotid diameter as the denominator. CONTRAST:  OMNIPAQUE IOHEXOL 350 MG/ML SOLN COMPARISON:  CT head 11/03/2019.  MRI head 11/03/2019 FINDINGS: CT HEAD FINDINGS Brain: Interval development of hypodensity right medial thalamus compatible with acute infarct as noted on MRI. No associated hemorrhage. No other infarct. No hemorrhage or mass. Ventricle size normal. Small subdural hygroma left frontal lobe unchanged. Vascular: Negative for hyperdense vessel Skull: Negative Sinuses: Air-fluid level sphenoid sinus. Mild mucosal edema remaining paranasal sinuses. Orbits: Bilateral cataract surgery.  No mass lesion. Review of the MIP images confirms the above findings CTA NECK FINDINGS Aortic arch: Standard branching. Imaged portion shows no evidence of aneurysm or dissection. No significant stenosis of the major arch vessel origins. Atherosclerotic calcification aortic arch. Right carotid system: Mild atherosclerotic disease right carotid bifurcation without stenosis Left carotid system: Mild atherosclerotic  disease left carotid bifurcation without stenosis. Vertebral arteries: Small vertebral arteries are patent to the basilar without stenosis. Skeleton: Degenerative changes cervical spine. No acute skeletal abnormality. Other neck: Negative for mass or adenopathy Upper chest: Lung apices clear bilaterally. Review of the MIP images confirms the above findings CTA HEAD FINDINGS Anterior circulation: Atherosclerotic calcification in the cavernous carotid bilaterally without significant stenosis. Anterior and middle cerebral arteries patent bilaterally without stenosis. Posterior circulation: Both vertebral arteries patent to the basilar. PICA patent bilaterally. Basilar widely patent. Superior cerebellar and posterior cerebral arteries patent bilaterally. Fetal origin of the posterior cerebral artery bilaterally. Venous sinuses: Negative Anatomic variants: None Review of the MIP images confirms the above findings IMPRESSION: 1. Acute infarct right thalamus.  Negative for hemorrhage. 2. No significant carotid or vertebral artery stenosis in the neck. Mild atherosclerotic disease in the carotid bifurcation bilaterally 3. Negative for intracranial large vessel occlusion. Electronically Signed   By: Marlan Palau M.D.   On: 11/04/2019 13:39   MR Brain Wo Contrast (neuro protocol)  Result Date: 11/03/2019 CLINICAL DATA:  74 year old female with dizziness upon waking today. Bradycardia. EXAM: MRI HEAD WITHOUT CONTRAST TECHNIQUE: Multiplanar, multiecho pulse sequences of the brain and surrounding structures were obtained without intravenous contrast. COMPARISON:  Head CT earlier today. FINDINGS: Brain: There is mildly asymmetric subarachnoid space CSF redemonstrated along the left anterior frontal convexity. No evidence of extra-axial blood or collection. No areas of disproportion cerebral atrophy identified. Confluent 12 mm focus of restricted diffusion in the anteromedial right thalamus on series 5, image 72. Associated  faint T2 and FLAIR hyperintensity. No hemorrhage or mass effect. No other restricted diffusion. And elsewhere gray and white matter signal is normal for age throughout the brain. No cortical encephalomalacia or chronic cerebral blood products. No midline shift, mass effect, evidence of mass lesion, ventriculomegaly, extra-axial collection or acute intracranial hemorrhage. Cervicomedullary junction and pituitary are within normal limits. Vascular: Major intracranial vascular flow voids are preserved. Skull and upper cervical spine: Negative for age visible cervical spine. Normal bone marrow signal. Sinuses/Orbits: Postoperative changes to both globes otherwise negative orbits. Trace paranasal sinus mucosal thickening, small fluid level in the right sphenoid sinus. Other: Mastoids are well pneumatized. Visible internal auditory structures appear normal. Normal stylomastoid foramina. IMPRESSION: 1. Acute lacunar infarct in the right thalamus. No associated hemorrhage or mass effect. 2. But elsewhere normal for age noncontrast MRI appearance of the brain. Electronically Signed   By: Genevie Ann M.D.   On: 11/03/2019 15:41   ECHOCARDIOGRAM COMPLETE  Result Date: 11/04/2019    ECHOCARDIOGRAM REPORT   Patient Name:   STORY CONTI Date of Exam: 11/04/2019 Medical Rec #:  [id number]           Height: Accession #:    [id number]          Weight: Date of Birth:  [birth year]           BSA: Patient Age:    74 years            BP:           144/62 mmHg Patient Gender: F                   HR:           54 bpm. Exam Location:  Inpatient Procedure: 2D Echo Indications:    Stroke 434.91 / I163.9  History:        Patient has no prior history of Echocardiogram examinations.                 Risk Factors:Hypertension, Dyslipidemia and Obesity. CKD.  Sonographer:    Vikki Ports Turrentine Referring Phys: Agency  1. Left ventricular ejection fraction, by estimation, is 60 to 65%. The left ventricle has normal  function. The left ventricle has no regional wall motion abnormalities. Left ventricular diastolic parameters were normal.  2. Right ventricular systolic function is normal. The right ventricular size is normal.  3. The mitral valve is normal in structure and function. No evidence of mitral valve regurgitation. No evidence of mitral stenosis.  4. The aortic valve is tricuspid. Aortic valve regurgitation is trivial. Mild aortic valve sclerosis is present, with no evidence of aortic valve stenosis.  5. The inferior vena cava is normal in size with greater than 50% respiratory variability, suggesting right atrial pressure of 3 mmHg. FINDINGS  Left Ventricle: Left ventricular ejection fraction, by estimation, is 60 to 65%. The left ventricle has normal function. The left ventricle has no regional wall motion abnormalities. The left ventricular internal cavity size was normal in size. There is  no left ventricular hypertrophy. Left ventricular diastolic parameters were normal. Normal left ventricular filling pressure. Right Ventricle: The right ventricular size is normal. No increase in right ventricular wall thickness. Right ventricular systolic function is normal. Left Atrium: Left atrial size was normal in size. Right Atrium: Right atrial size was normal in size. Pericardium: There is no evidence of pericardial effusion. Mitral Valve: The mitral valve is normal in structure and function. Normal  mobility of the mitral valve leaflets. No evidence of mitral valve regurgitation. No evidence of mitral valve stenosis. Tricuspid Valve: The tricuspid valve is normal in structure. Tricuspid valve regurgitation is not demonstrated. No evidence of tricuspid stenosis. Aortic Valve: The aortic valve is tricuspid. Aortic valve regurgitation is trivial. Mild aortic valve sclerosis is present, with no evidence of aortic valve stenosis. Pulmonic Valve: The pulmonic valve was normal in structure. Pulmonic valve regurgitation is  trivial. No evidence of pulmonic stenosis. Aorta: The aortic root is normal in size and structure. Venous: The inferior vena cava is normal in size with greater than 50% respiratory variability, suggesting right atrial pressure of 3 mmHg. IAS/Shunts: No atrial level shunt detected by color flow Doppler.  LEFT VENTRICLE PLAX 2D LVIDd:         3.90 cm    Diastology LVIDs:         2.54 cm    LV e' lateral:   11.70 cm/s LVOT diam:     1.90 cm    LV E/e' lateral: 6.2 LV SV:         6747.98 ml LV e' medial:    8.70 cm/s LVOT Area:     2.84 cm   LV E/e' medial:  8.4  LEFT ATRIUM LA Vol (A2C): 42.8 ml LA Vol (A4C): 45.5 ml  AORTIC VALVE LVOT Vmax:   11100.00 cm/s LVOT Vmean:  7070.000 cm/s LVOT VTI:    23.800 m MITRAL VALVE MV Area (PHT): 10.54 cm     SHUNTS MV Decel Time: 72 msec       Systemic VTI:  23.80 m MV E velocity: 73.00 cm/s    Systemic Diam: 1.90 cm MV A velocity: 9400.00 cm/s MV E/A ratio:  0.01 Armanda Magic MD Electronically signed by Armanda Magic MD Signature Date/Time: 11/04/2019/12:27:24 PM    Final      Labs:   Basic Metabolic Panel: Recent Labs  Lab 11/03/19 1156 11/03/19 1156 11/03/19 1207 11/03/19 1207 11/03/19 1211 11/03/19 1211 11/04/19 0341 11/05/19 0330  NA 131*  --  135  --  136  --  136 137  K 6.8*   < > 4.2   < > 4.1   < > 3.7 3.7  CL 105  --  101  --  101  --  100 102  CO2  --   --  24  --   --   --  25 26  GLUCOSE 109*  --  108*  --  105*  --  111* 114*  BUN 14  --  10  --  11  --  9 12  CREATININE 1.20*  --  1.29*  --  1.30*  --  1.25* 1.31*  CALCIUM  --   --  8.9  --   --   --  8.7* 8.5*   < > = values in this interval not displayed.   GFR Estimated Creatinine Clearance: 51.2 mL/min (A) (by C-G formula based on SCr of 1.31 mg/dL (H)). Liver Function Tests: Recent Labs  Lab 11/03/19 1207  AST 17  ALT 12  ALKPHOS 64  BILITOT 0.5  PROT 7.3  ALBUMIN 3.1*   No results for input(s): LIPASE, AMYLASE in the last 168 hours. No results for input(s): AMMONIA in  the last 168 hours. Coagulation profile Recent Labs  Lab 11/03/19 1207  INR 1.1    CBC: Recent Labs  Lab 11/03/19 1154 11/03/19 1154 11/03/19 1156 11/03/19 1207 11/03/19 1211 11/04/19 0341 11/05/19  0330  WBC DUPLICATE REQUEST  --   --  5.2  --  6.0 4.7  NEUTROABS DUPLICATE REQUEST  --   --  3.4  --   --   --   HGB DUPLICATE REQUEST   < > 12.9 12.5 12.9 11.4* 11.0*  HCT DUPLICATE REQUEST   < > 38.0 39.8 38.0 35.1* 34.6*  MCV DUPLICATE REQUEST  --   --  100.8*  --  96.4 97.2  PLT DUPLICATE REQUEST  --   --  249  --  247 251   < > = values in this interval not displayed.   Cardiac Enzymes: No results for input(s): CKTOTAL, CKMB, CKMBINDEX, TROPONINI in the last 168 hours. BNP: Invalid input(s): POCBNP CBG: Recent Labs  Lab 11/03/19 1114 11/05/19 0617  GLUCAP 98 100*   D-Dimer No results for input(s): DDIMER in the last 72 hours. Hgb A1c Recent Labs    11/03/19 1207  HGBA1C 5.9*   Lipid Profile Recent Labs    11/03/19 1207  CHOL 292*  HDL 40*  LDLCALC 230*  TRIG 111  CHOLHDL 7.3   Thyroid function studies No results for input(s): TSH, T4TOTAL, T3FREE, THYROIDAB in the last 72 hours.  Invalid input(s): FREET3 Anemia work up No results for input(s): VITAMINB12, FOLATE, FERRITIN, TIBC, IRON, RETICCTPCT in the last 72 hours. Microbiology Recent Results (from the past 240 hour(s))  SARS CORONAVIRUS 2 (TAT 6-24 HRS) Nasopharyngeal Nasopharyngeal Swab     Status: None   Collection Time: 11/03/19  7:23 PM   Specimen: Nasopharyngeal Swab  Result Value Ref Range Status   SARS Coronavirus 2 NEGATIVE NEGATIVE Final    Comment: (NOTE) SARS-CoV-2 target nucleic acids are NOT DETECTED. The SARS-CoV-2 RNA is generally detectable in upper and lower respiratory specimens during the acute phase of infection. Negative results do not preclude SARS-CoV-2 infection, do not rule out co-infections with other pathogens, and should not be used as the sole basis for  treatment or other patient management decisions. Negative results must be combined with clinical observations, patient history, and epidemiological information. The expected result is Negative. Fact Sheet for Patients: HairSlick.no Fact Sheet for Healthcare Providers: quierodirigir.com This test is not yet approved or cleared by the [place] FDA and  has been authorized for detection and/or diagnosis of SARS-CoV-2 by FDA under an Emergency Use Authorization (EUA). This EUA will remain  in effect (meaning this test can be used) for the duration of the COVID-19 declaration under Section 56 4(b)(1) of the Act, 21 U.S.C. section 360bbb-3(b)(1), unless the authorization is terminated or revoked sooner. Performed at [facility], [address] [place], [place] [address]      Discharge Instructions:   Discharge Instructions    Ambulatory referral to Neurology   Complete by: As directed    Follow up in stroke clinic at [facility] with Ihor Austin, NP in about 4 weeks. If not available, consider Dr. Delia Heady, Dr. Jamelle Rushing, or Dr. Naomie Dean.   Ok to see NP     Allergies as of 11/05/2019      Reactions   Ppd [tuberculin Purified Protein Derivative]    Sulfa Antibiotics    Tolectin [tolmetin]       Medication List    STOP taking these medications   bisoprolol-hydrochlorothiazide 5-6.25 MG tablet Commonly known as: ZIAC   losartan 50 MG tablet Commonly known as: Cozaar   rosuvastatin 20 MG tablet Commonly known as: Crestor  TAKE these medications   amLODipine 5 MG tablet Commonly known as: NORVASC Take 1 tablet (5 mg total) by mouth daily.   aspirin 81 MG EC tablet Take 1 tablet (81 mg total) by mouth daily.   atorvastatin 80 MG tablet Commonly known as: LIPITOR Take 1 tablet (80 mg total) by mouth daily at 6 PM. What changed: See the new instructions.   cefdinir  300 MG capsule Commonly known as: OMNICEF Take 1 capsule (300 mg total) by mouth every 12 (twelve) hours for 3 days.   clopidogrel 75 MG tablet Commonly known as: PLAVIX Take 1 tablet (75 mg total) by mouth daily for 21 days.   fluticasone 50 MCG/ACT nasal spray Commonly known as: FLONASE Place 2 sprays into both nostrils daily. What changed:   when to take this  reasons to take this      Follow-up Information    [facility] Follow up in 4 week(s).   Specialty: Neurology Why: stroke clinic. office will call with appt date and time.  Contact information: [address] [place] [address] [phone]           Time coordinating discharge: 45 minutes  Signed:  Gwenyth Bender NP  [facility] 11/05/2019, 9:50 AM

## 2019-11-05 NOTE — TOC Progression Note (Signed)
Transition of Care [facility]) - Progression Note    Patient Details  Name: Susan Fernandez MRN: [id number] Date of Birth: October 21, 1945  [facility] of [facility] CM/SW Contact  Terrilee Croak, Student-Social Work Phone Number: 11/05/2019, 11:10 AM  Clinical Narrative:     MSW Intern went to speak with pt to discuss her concerns with home health. Pt changed her mind and now would like HH and DME services. Pt has no preference for agency and is ok with walker and 3 n 1 being ordered. DME ordered.       Expected Discharge Plan and Services                                                 Social Determinants of Health (SDOH) Interventions    Readmission Risk Interventions No flowsheet data found.

## 2019-11-05 NOTE — Progress Notes (Signed)
Physical Therapy Treatment Patient Details Name: Susan Fernandez MRN: [id number] DOB: 22-Aug-1946 Today's Date: 11/05/2019    History of Present Illness This is a 74 year old female with past medical history of GERD, CKD 3, hypertension, hyperlipidemia, morbid obesity, fall 1 year ago leading to chronic dizziness, posterior vitreous detachment and cataracts who presented to the ED on 2/21 with acute on chronic onset dizziness, double vision, difficulty walking.[id number] lacunar infarct of right thalamus without associated hemorrhage or mass-effect    PT Comments    Pt continues to have issues with dizziness and needs min guard A with mobility for safety. Pt ambulated 100' with RW, would not ambulate further or to stairs due to anxiety over belongings being left in her room.  She did agree to HHPT. She needs min A for transfers at times and will have her cousin staying with her. PT will continue to follow.   Follow Up Recommendations  Home health PT(patient does not want)     Equipment Recommendations  None recommended by PT    Recommendations for Other Services       Precautions / Restrictions Precautions Precautions: Fall Restrictions Weight Bearing Restrictions: No    Mobility  Bed Mobility Overal bed mobility: Needs Assistance Bed Mobility: Supine to Sit;Sit to Supine     Supine to sit: Supervision Sit to supine: Modified independent (Device/Increase time)   General bed mobility comments: vc's for gaze stabilization during bed mobility  Transfers Overall transfer level: Needs assistance Equipment used: Rolling walker (2 wheeled) Transfers: Sit to/from Stand Sit to Stand: Supervision         General transfer comment: vc's for gaze stability and hand placement. increased time needed  Ambulation/Gait Ambulation/Gait assistance: Min guard Gait Distance (Feet): 100 Feet Assistive device: Rolling walker (2 wheeled) Gait Pattern/deviations: Step-through  pattern;Decreased stride length Gait velocity: decr Gait velocity interpretation: 1.31 - 2.62 ft/sec, indicative of limited community ambulator General Gait Details: did not feel comfortable ambulating further than 50' from her room, seemed more to be due to fear of her purse being in room (despite being hidden in closet, room door closed, and RN watching room for safety). Pt reported mild dizziness with ambulation.    Stairs             Wheelchair Mobility    Modified Rankin (Stroke Patients Only) Modified Rankin (Stroke Patients Only) Pre-Morbid Rankin Score: No symptoms Modified Rankin: Slight disability     Balance Overall balance assessment: Mild deficits observed, not formally tested                                          Cognition Arousal/Alertness: Awake/alert Behavior During Therapy: WFL for tasks assessed/performed;Anxious Overall Cognitive Status: Within Functional Limits for tasks assessed                                 General Comments: seems to be intact from a cognitive standpoint but fiercely independent to the point of ignoring safety concerns      Exercises      General Comments General comments (skin integrity, edema, etc.): discussed fall risk and benefits of HHPT for returning to PLOF      Pertinent Vitals/Pain Pain Assessment: No/denies pain    Home Living  Prior Function            PT Goals (current goals can now be found in the care plan section) Acute Rehab PT Goals Patient Stated Goal: to return home, feel better PT Goal Formulation: With patient Time For Goal Achievement: 11/18/19 Potential to Achieve Goals: Good Progress towards PT goals: Progressing toward goals    Frequency    Min 4X/week      PT Plan Current plan remains appropriate    Co-evaluation              AM-PAC PT "6 Clicks" Mobility   Outcome Measure  Help needed turning from your  back to your side while in a flat bed without using bedrails?: None Help needed moving from lying on your back to sitting on the side of a flat bed without using bedrails?: A Little Help needed moving to and from a bed to a chair (including a wheelchair)?: A Little Help needed standing up from a chair using your arms (e.g., wheelchair or bedside chair)?: A Little Help needed to walk in hospital room?: A Little Help needed climbing 3-5 steps with a railing? : A Little 6 Click Score: 19    End of Session Equipment Utilized During Treatment: Gait belt Activity Tolerance: Patient tolerated treatment well Patient left: in bed;with call bell/phone within reach;with bed alarm set Nurse Communication: Mobility status PT Visit Diagnosis: Other abnormalities of gait and mobility (R26.89);History of falling (Z91.81);Muscle weakness (generalized) (M62.81);Other symptoms and signs involving the nervous system (R29.898)     Time: 1201-1219 PT Time Calculation (min) (ACUTE ONLY): 18 min  Charges:  $Gait Training: 8-22 mins                     Leighton Roach, [place]  Pager [phone] Office [place] 11/05/2019, 2:12 PM

## 2019-11-05 NOTE — Progress Notes (Signed)
Pt given discharge summary and discharged home via cousin as transportation.

## 2019-11-05 NOTE — TOC Transition Note (Cosign Needed Addendum)
Transition of Care [facility]) - CM/SW Discharge Note   Patient Details  Name: Susan Fernandez MRN: [id number] Date of Birth: October 02, 1945  [facility]) CM/SW Contact:  Terrilee Croak, Student-Social Work Phone Number: 11/05/2019, 11:42 AM   Clinical Narrative:     HH set up with [facility], DME set up with [employer]. No other needs at this time.   Addendum 12:15 pm 11/05/2019 Pt refused DME.  Final next level of care: Home w Home Health Services Barriers to Discharge: Barriers Resolved   Patient Goals and CMS Choice        Discharge Placement                  Name of family member notified: Self Patient and family notified of of transfer: 11/05/19  Discharge Plan and Services                DME Arranged: Dan Humphreys, 3-N-1 DME Agency: [employer] Date DME Agency Contacted: 11/05/19 Time DME Agency Contacted: [phone] Representative spoke with at DME Agency: Ian Malkin HH Arranged: PT, OT, Nurse's Aide, RN HH Agency: [facility] ([facility] Date HH Agency Contacted: 11/05/19   Representative spoke with at [facility] Agency: Tiffany  Social Determinants of Health (SDOH) Interventions     Readmission Risk Interventions No flowsheet data found.

## 2019-11-05 NOTE — Progress Notes (Signed)
Occupational Therapy Treatment Patient Details Name: Susan Fernandez MRN: [id number] DOB: 1945/11/15 Today's Date: 11/05/2019    History of present illness This is a 74 year old female with past medical history of GERD, CKD 3, hypertension, hyperlipidemia, morbid obesity, fall 1 year ago leading to chronic dizziness, posterior vitreous detachment and cataracts who presented to the ED on 2/21 with acute on chronic onset dizziness, double vision, difficulty walking.[id number] lacunar infarct of right thalamus without associated hemorrhage or mass-effect   OT comments  Pt making steady progress towards OT goals this session. Pt reports feeling anxious about DC home upon OT arrival but continues to decline [facility] services or DME. Provided education on benefits of HH/ DME with continued refusal. Pt completed UB/LB ADLs with sup. Pt requires min guard- MIN A for functional mobility with no AE needing hand held assist for balance. Continue to strongly encourage [place] for DC. Will follow acutely.    Follow Up Recommendations  Home health OT;Outpatient OT;Other (comment)(but pt declines any follow up services)    Equipment Recommendations       Recommendations for Other Services      Precautions / Restrictions Precautions Precautions: Fall Restrictions Weight Bearing Restrictions: No       Mobility Bed Mobility Overal bed mobility: Needs Assistance Bed Mobility: Supine to Sit     Supine to sit: Supervision Sit to supine: Modified independent (Device/Increase time)   General bed mobility comments: supervision for safety; use of bed rails  Transfers Overall transfer level: Needs assistance Equipment used: 1 person hand held assist Transfers: Sit to/from [facility] Sit to Stand: Min assist Stand pivot transfers: Min guard       General transfer comment: MIN to power into standing from EOB ; min guard for light balance assist for stand pivot txfr from [employer]  chair for DC home    Balance Overall balance assessment: Mild deficits observed, not formally tested                                         ADL either performed or assessed with clinical judgement   ADL Overall ADL's : Needs assistance/impaired     Grooming: Sitting;Supervision/safety Grooming Details (indicate cue type and reason): applying lotion to LB from EOB; sup for safety     Lower Body Bathing: Supervison/ safety;Sitting/lateral leans Lower Body Bathing Details (indicate cue type and reason): simulated via applying lotion to LB from EOB; sup for safety when reaching to LB Upper Body Dressing : Supervision/safety;Sitting;Standing Upper Body Dressing Details (indicate cue type and reason): to don jacket EOB Lower Body Dressing: Supervision/safety;Sitting/lateral leans Lower Body Dressing Details (indicate cue type and reason): to don shoes EOB Toilet Transfer: Stand-pivot;Min Psychiatric nurse Details (indicate cue type and reason): min guard for safety for simulated txfr from [employer] chair for DC         Functional mobility during ADLs: Min guard(stand pivot txfr only) General ADL Comments: pt presents dizziness, decreased activity tolerance and generalized weakness limiting participation in ADL engagement. session focus on LB dressing/ bathing, UB dressing and stand pivot txfr     Vision Baseline Vision/History: Cataracts Patient Visual Report: Blurring of vision     Perception     Praxis      Cognition Arousal/Alertness: Awake/alert Behavior During Therapy: Anxious Overall Cognitive Status: Within Functional Limits for tasks assessed  General Comments: overall WFL but reports feeling anxious about returning home although pt declined DME or HH        Exercises     Shoulder Instructions       General Comments provided education on benefits of DME and HH with pt still declining  services    Pertinent Vitals/ Pain       Pain Assessment: No/denies pain  Home Living                                          Prior Functioning/Environment              Frequency  Min 2X/week        Progress Toward Goals  OT Goals(current goals can now be found in the care plan section)  Progress towards OT goals: Progressing toward goals  Acute Rehab OT Goals Patient Stated Goal: to return home, feel better OT Goal Formulation: With patient Time For Goal Achievement: 11/18/19 Potential to Achieve Goals: Good  Plan Discharge plan remains appropriate    Co-evaluation                 AM-PAC OT "6 Clicks" Daily Activity     Outcome Measure   Help from another person eating meals?: None Help from another person taking care of personal grooming?: A Little Help from another person toileting, which includes using toliet, bedpan, or urinal?: A Little Help from another person bathing (including washing, rinsing, drying)?: A Little Help from another person to put on and taking off regular upper body clothing?: A Little Help from another person to put on and taking off regular lower body clothing?: A Little 6 Click Score: 19    End of Session    OT Visit Diagnosis: Unsteadiness on feet (R26.81);Other abnormalities of gait and mobility (R26.89);Other (comment)   Activity Tolerance Patient tolerated treatment well   Patient Left Other (comment)(handed off to volunteer for DC home)   Nurse Communication Mobility status        Time: 1308-6578 OT Time Calculation (min): 16 min  Charges: OT General Charges $OT Visit: 1 Visit OT Treatments $Self Care/Home Management : 8-22 mins  Audery Amel., COTA/L Acute Rehabilitation Services [phone]    Angelina Pih 11/05/2019, 4:25 PM

## 2019-11-06 ENCOUNTER — Telehealth: Payer: Self-pay | Admitting: *Deleted

## 2019-11-06 LAB — URINE CULTURE: Culture: 10000 — AB

## 2019-11-06 NOTE — Telephone Encounter (Signed)
I have attempted without success to contact this patient by phone to will try again later.

## 2019-11-08 ENCOUNTER — Telehealth: Payer: Self-pay | Admitting: *Deleted

## 2019-11-08 NOTE — Telephone Encounter (Signed)
I have attempted to contact this patient by phone with the following results: no answer.

## 2019-11-09 DIAGNOSIS — K589 Irritable bowel syndrome without diarrhea: Secondary | ICD-10-CM | POA: Diagnosis not present

## 2019-11-09 DIAGNOSIS — R42 Dizziness and giddiness: Secondary | ICD-10-CM | POA: Diagnosis not present

## 2019-11-09 DIAGNOSIS — E782 Mixed hyperlipidemia: Secondary | ICD-10-CM | POA: Diagnosis not present

## 2019-11-09 DIAGNOSIS — M47812 Spondylosis without myelopathy or radiculopathy, cervical region: Secondary | ICD-10-CM | POA: Diagnosis not present

## 2019-11-09 DIAGNOSIS — N1831 Chronic kidney disease, stage 3a: Secondary | ICD-10-CM | POA: Diagnosis not present

## 2019-11-09 DIAGNOSIS — K219 Gastro-esophageal reflux disease without esophagitis: Secondary | ICD-10-CM | POA: Diagnosis not present

## 2019-11-09 DIAGNOSIS — I872 Venous insufficiency (chronic) (peripheral): Secondary | ICD-10-CM | POA: Diagnosis not present

## 2019-11-09 DIAGNOSIS — I129 Hypertensive chronic kidney disease with stage 1 through stage 4 chronic kidney disease, or unspecified chronic kidney disease: Secondary | ICD-10-CM | POA: Diagnosis not present

## 2019-11-11 ENCOUNTER — Telehealth: Payer: Self-pay | Admitting: *Deleted

## 2019-11-11 NOTE — Telephone Encounter (Signed)
I have attempted to contact this patient by phone with the following results: no answer.

## 2019-11-11 NOTE — Telephone Encounter (Signed)
Called patient on 11/11/2019 , 4:45 PM in an attempt to reach the patient for a hospital follow up.  Spoke with the patient's niece, Sheralyn Boatman, to obtain a phone # the patient can be reached at. Admit date: 11/03/19 Discharge: 11/05/19   She does not have any questions or concerns about medications from the hospital admission. The patient's medications were reviewed over the phone, they were counseled to bring in all current medications to the hospital follow up visit.   I advised the patient to call if any questions or concerns arise about the hospital admission or medications. Patient has finished the antibiotic for her UTI and is not having any symptoms.  Home health was started in the hospital. [facility] is visiting the patient. All questions were answered and a follow up appointment was made. The patient has an appointment for a hospital follow up visit 11/19/2019 with Vara Guardian.  Prior to Admission medications   Medication Sig Start Date End Date Taking? Authorizing Provider  amLODipine (NORVASC) 5 MG tablet Take 1 tablet (5 mg total) by mouth daily. 11/05/19   Gwenyth Bender, NP  aspirin EC 81 MG EC tablet Take 1 tablet (81 mg total) by mouth daily. 11/05/19   Black, Lesle Chris, NP  atorvastatin (LIPITOR) 80 MG tablet Take 1 tablet (80 mg total) by mouth daily at 6 PM. 11/05/19   Black, Lesle Chris, NP  clopidogrel (PLAVIX) 75 MG tablet Take 1 tablet (75 mg total) by mouth daily for 21 days. 11/05/19 11/26/19  Gwenyth Bender, NP  fluticasone (FLONASE) 50 MCG/ACT nasal spray Place 2 sprays into both nostrils daily. Patient taking differently: Place 2 sprays into both nostrils daily as needed.  12/22/15   Shirleen Schirmer, PA-C

## 2019-11-12 DIAGNOSIS — R42 Dizziness and giddiness: Secondary | ICD-10-CM | POA: Diagnosis not present

## 2019-11-12 DIAGNOSIS — E782 Mixed hyperlipidemia: Secondary | ICD-10-CM | POA: Diagnosis not present

## 2019-11-12 DIAGNOSIS — N1831 Chronic kidney disease, stage 3a: Secondary | ICD-10-CM | POA: Diagnosis not present

## 2019-11-12 DIAGNOSIS — M47812 Spondylosis without myelopathy or radiculopathy, cervical region: Secondary | ICD-10-CM | POA: Diagnosis not present

## 2019-11-12 DIAGNOSIS — K219 Gastro-esophageal reflux disease without esophagitis: Secondary | ICD-10-CM | POA: Diagnosis not present

## 2019-11-12 DIAGNOSIS — I129 Hypertensive chronic kidney disease with stage 1 through stage 4 chronic kidney disease, or unspecified chronic kidney disease: Secondary | ICD-10-CM | POA: Diagnosis not present

## 2019-11-12 DIAGNOSIS — I872 Venous insufficiency (chronic) (peripheral): Secondary | ICD-10-CM | POA: Diagnosis not present

## 2019-11-12 DIAGNOSIS — K589 Irritable bowel syndrome without diarrhea: Secondary | ICD-10-CM | POA: Diagnosis not present

## 2019-11-14 DIAGNOSIS — K219 Gastro-esophageal reflux disease without esophagitis: Secondary | ICD-10-CM | POA: Diagnosis not present

## 2019-11-14 DIAGNOSIS — I129 Hypertensive chronic kidney disease with stage 1 through stage 4 chronic kidney disease, or unspecified chronic kidney disease: Secondary | ICD-10-CM | POA: Diagnosis not present

## 2019-11-14 DIAGNOSIS — M47812 Spondylosis without myelopathy or radiculopathy, cervical region: Secondary | ICD-10-CM | POA: Diagnosis not present

## 2019-11-14 DIAGNOSIS — I872 Venous insufficiency (chronic) (peripheral): Secondary | ICD-10-CM | POA: Diagnosis not present

## 2019-11-14 DIAGNOSIS — N1831 Chronic kidney disease, stage 3a: Secondary | ICD-10-CM | POA: Diagnosis not present

## 2019-11-14 DIAGNOSIS — E782 Mixed hyperlipidemia: Secondary | ICD-10-CM | POA: Diagnosis not present

## 2019-11-14 DIAGNOSIS — R42 Dizziness and giddiness: Secondary | ICD-10-CM | POA: Diagnosis not present

## 2019-11-14 DIAGNOSIS — K589 Irritable bowel syndrome without diarrhea: Secondary | ICD-10-CM | POA: Diagnosis not present

## 2019-11-18 NOTE — Progress Notes (Signed)
Hospital follow up  Assessment and Plan: Hospital visit follow up for:   Susan Fernandez was seen today for hospitalization follow-up.  Diagnoses and all orders for this visit:  Right thalamic infarction (HCC) Continue plavix to complete 21 day course per discharge Continue amlodipine, atorvastatin, aspirin Follow up as scheduled with neuro Encouraged increased exercise/activity, enquire about PT with home health Can order PT if they state no plans to initiate Alternately consider water aerobics, silver sneaker Will go to the ER if severe headache, changes vision/speech, imbalance, weakness.  Essential hypertension Continue medication Monitor blood pressure at home; call if consistently over 130/80 Continue DASH diet.   Reminder to go to the ER if any CP, SOB, nausea, dizziness, severe HA, changes vision/speech, left arm numbness and tingling and jaw pain. -     CBC with Differential/Platelet -     COMPLETE METABOLIC PANEL WITH GFR -     TSH -     amLODipine (NORVASC) 5 MG tablet; Take 1 tablet (5 mg total) by mouth daily.  Urinary tract infection without hematuria, site unspecified Completed abx; check UA for resolution -     Urinalysis w microscopic + reflex cultur  Mixed hyperlipidemia Continue medications Continue low cholesterol diet and exercise.  Check lipid panel at 3 month OV -     COMPLETE METABOLIC PANEL WITH GFR -     TSH -     atorvastatin (LIPITOR) 80 MG tablet; Take 1 tablet (80 mg total) by mouth daily at 6 PM.  Allergic rhinitis, unspecified seasonality, unspecified trigger -     fluticasone (FLONASE) 50 MCG/ACT nasal spray; Place 2 sprays into both nostrils daily as needed.   All medications were reviewed with patient and family and fully reconciled. All questions answered fully, and patient and family members were encouraged to call the office with any further questions or concerns. Discussed goal to avoid readmission related to this diagnosis.   Medications  Discontinued During This Encounter  Medication Reason  . fluticasone (FLONASE) 50 MCG/ACT nasal spray Reorder  . amLODipine (NORVASC) 5 MG tablet Reorder  . atorvastatin (LIPITOR) 80 MG tablet Reorder    Over 40 minutes of exam, counseling, chart review, and complex, high/moderate level critical decision making was performed this visit.   Future Appointments  Date Time Provider Department Center  12/05/2019 10:15 AM Ihor Austin, NP [facility] None    HPI 74 y.o.female lost to follow up, last seen in Fall 2019 presents for follow up for transition from recent hospitalization or [facility]IF stay. Admit date to the hospital was 11/03/19, patient was discharged from the hospital on 11/05/19 and our clinical staff contacted the office the day after discharge to set up a follow up appointment. The discharge summary, medications, and diagnostic test results were reviewed before meeting with the patient. The patient was admitted for:   Principal Problem:   Thalamic stroke [facility]) Active Problems:   HTN (hypertension)   CKD (chronic kidney disease) stage 3, GFR 30-59 ml/min   Posterior left knee pain   UTI (urinary tract infection)   Hyperlipidemia   Morbid obesity (HCC)  1. Follow up with neurology as scheduled  2. Follow up with PCP 2-3 weeks for evaluation of BP control and resolution of UTI  HPI per discharge note:  This is a64 year old female with past medical history of GERD, CKD 3, hypertension, hyperlipidemia, morbid obesity, fall 1 year ago leading to chronic dizziness, posterior vitreous detachment and cataracts who presented to the ED on 2/21 with  acute on chronic onset dizziness and double vision. Last known normal at 9:30 PM night prior, awoke at 6 AM with stated issues. Was having difficulty with walking at home needing to hang onto things. Called her friend who insisted to come to the ED. CT head without contrast without acute abnormality. MRI brain in ED which showed acute lacunar  infarct of right thalamus without associated hemorrhage or mass-effect. Neuro hospitalist was consulted.  She stated that she was on the commode at home when she slipped off onto the floor due to the dizziness. Did not hit her head, did not have LOC. Denied tobacco, alcohol use. Stated she had not been taking her statin for 2 months at least as she ran out. Only takes Bisoprolol antihypertensive.   Hospital course:  1. Acute lacunar infarct right thalamus Per MRI. CT head done negative for any acute abnormalities. MRI with acute right thalamic lacunar infarct. CT angiogram head and neck confirmed Acute infarct right thalamus, Negative for hemorrhage, No significant carotid or vertebral artery stenosis in the neck, Mild atherosclerotic disease in the carotid bifurcation bilaterally, Negative for intracranial large vessel occlusion. 2D echo done with a EF of 60 to 65% with no source of emboli. Fasting lipid panel with LDL of 230. Hemoglobin A1c of 5.9. Patient seen in consultation by neurology who are recommending dual antiplatelet therapy with aspirin and Plavix x3 weeks and then Aspirin only. PT/OT/ST recommended HH which is being arranged. Follow up with neuro as scheduled  2. Hypertension. Fair control. Home meds included losartan, bisoprolol, HCTZ which were held initially for permissive hypertension. Amlodipine started during hospitalization with good control. At discharge will continue amlodipine, stop losartan and bisoprolol and hctz with close follow up with PCP for BP control.  3. Hyperlipidemia LDL of 230. Statin.  4. Chronic kidney disease stage IIIa Stable.   5, UTI. Culture pending. Discharge with 3 days of omnicef. Follow up with PCP   11/19/2019 follow up:  BP 126/78   Pulse 71   Temp (!) 97.2 F (36.2 C)   Wt 235 lb 9.6 oz (106.9 kg)   SpO2 98%   BMI 34.79 kg/m    She presents stating doing well, very nearly back to baseline. Accompanied by her niece who drove  her today. She reports occasional dizziness with blurry vision, but quickly resolves. Has been using a walker at home just when getting out of bed. Denies falls.   She has been taking ASA, plavix 75 mg (plan for 21 days), neuro follow up is scheduled for 12/05/2019 with Ihor Austin, NP-.    Home health - has been coming weekly, checking VS and doing med reviews. NO PT yet but believes this will be starting soon. Encouraged to enquire with home health.   UTI: she did complete 3 day course of omnicef, denies urinary sx  BMI is Body mass index is 34.79 kg/m., she has been working on diet, admits not doing much exercise or activity, doesn't feel safe in neighborhood, lots of dogs, some lightheadedness.  Wt Readings from Last 3 Encounters:  11/19/19 235 lb 9.6 oz (106.9 kg)  11/04/19 234 lb 12.6 oz (106.5 kg)  06/15/18 260 lb 12.8 oz (118.3 kg)   Her blood pressure has been controlled at home (110-140/60-70ss), today their BP is BP: 126/78  She does not workout. She denies chest pain, shortness of breath, dizziness.   She is on cholesterol medication (atorvastatin 80 mg daily was restarted) and denies myalgias. Her cholesterol is  not at goal. The cholesterol last visit was:   Lab Results  Component Value Date   CHOL 292 (H) 11/03/2019   HDL 40 (L) 11/03/2019   LDLCALC 230 (H) 11/03/2019   TRIG 111 11/03/2019   CHOLHDL 7.3 11/03/2019    She has not been working on diet and exercise for prediabetes, and denies increased appetite, nausea, paresthesia of the feet, polydipsia, polyuria, vomiting and weight loss. Last A1C in the office was:  Lab Results  Component Value Date   HGBA1C 5.9 (H) 11/03/2019      Home health is involved.   Images while in the hospital: CT ANGIO HEAD W OR WO CONTRAST  Result Date: 11/04/2019 CLINICAL DATA:  Stroke EXAM: CT ANGIOGRAPHY HEAD AND NECK TECHNIQUE: Multidetector CT imaging of the head and neck was performed using the standard protocol during bolus  administration of intravenous contrast. Multiplanar CT image reconstructions and MIPs were obtained to evaluate the vascular anatomy. Carotid stenosis measurements (when applicable) are obtained utilizing NASCET criteria, using the distal internal carotid diameter as the denominator. CONTRAST:  100mL OMNIPAQUE IOHEXOL 350 MG/ML SOLN COMPARISON:  CT head 11/03/2019.  MRI head 11/03/2019 FINDINGS: CT HEAD FINDINGS Brain: Interval development of hypodensity right medial thalamus compatible with acute infarct as noted on MRI. No associated hemorrhage. No other infarct. No hemorrhage or mass. Ventricle size normal. Small subdural hygroma left frontal lobe unchanged. Vascular: Negative for hyperdense vessel Skull: Negative Sinuses: Air-fluid level sphenoid sinus. Mild mucosal edema remaining paranasal sinuses. Orbits: Bilateral cataract surgery.  No mass lesion. Review of the MIP images confirms the above findings CTA NECK FINDINGS Aortic arch: Standard branching. Imaged portion shows no evidence of aneurysm or dissection. No significant stenosis of the major arch vessel origins. Atherosclerotic calcification aortic arch. Right carotid system: Mild atherosclerotic disease right carotid bifurcation without stenosis Left carotid system: Mild atherosclerotic disease left carotid bifurcation without stenosis. Vertebral arteries: Small vertebral arteries are patent to the basilar without stenosis. Skeleton: Degenerative changes cervical spine. No acute skeletal abnormality. Other neck: Negative for mass or adenopathy Upper chest: Lung apices clear bilaterally. Review of the MIP images confirms the above findings CTA HEAD FINDINGS Anterior circulation: Atherosclerotic calcification in the cavernous carotid bilaterally without significant stenosis. Anterior and middle cerebral arteries patent bilaterally without stenosis. Posterior circulation: Both vertebral arteries patent to the basilar. PICA patent bilaterally. Basilar  widely patent. Superior cerebellar and posterior cerebral arteries patent bilaterally. Fetal origin of the posterior cerebral artery bilaterally. Venous sinuses: Negative Anatomic variants: None Review of the MIP images confirms the above findings IMPRESSION: 1. Acute infarct right thalamus.  Negative for hemorrhage. 2. No significant carotid or vertebral artery stenosis in the neck. Mild atherosclerotic disease in the carotid bifurcation bilaterally 3. Negative for intracranial large vessel occlusion. Electronically Signed   By: Marlan Palauharles  Clark M.D.   On: 11/04/2019 13:39   DG Knee 1-2 Views Left  Result Date: 11/04/2019 CLINICAL DATA:  Posterior left knee pain since a fall yesterday. Initial encounter. EXAM: LEFT KNEE - 1-2 VIEW COMPARISON:  None. FINDINGS: There is no acute bony or joint abnormality. Tricompartmental osteophytes are present. No joint effusion or chondrocalcinosis. IMPRESSION: No acute abnormality. Osteoarthritis. Electronically Signed   By: Drusilla Kannerhomas  Dalessio M.D.   On: 11/04/2019 12:46   CT HEAD WO CONTRAST  Result Date: 11/03/2019 CLINICAL DATA:  Onset dizziness and unsteady gait today. EXAM: CT HEAD WITHOUT CONTRAST TECHNIQUE: Contiguous axial images were obtained from the base of the skull through the vertex without  intravenous contrast. COMPARISON:  None. FINDINGS: Brain: No evidence of acute infarction, hemorrhage, hydrocephalus, extra-axial collection or mass lesion/mass effect. Cortical atrophy is noted. Vascular: No hyperdense vessel or unexpected calcification. Skull: Intact.  No focal lesion. Sinuses/Orbits: Status post cataract surgery.  Otherwise negative. Other: None. IMPRESSION: No acute abnormality. Cortical atrophy. Electronically Signed   By: Drusilla Kannerhomas  Dalessio M.D.   On: 11/03/2019 12:59   CT ANGIO NECK W OR WO CONTRAST  Result Date: 11/04/2019 CLINICAL DATA:  Stroke EXAM: CT ANGIOGRAPHY HEAD AND NECK TECHNIQUE: Multidetector CT imaging of the head and neck was performed  using the standard protocol during bolus administration of intravenous contrast. Multiplanar CT image reconstructions and MIPs were obtained to evaluate the vascular anatomy. Carotid stenosis measurements (when applicable) are obtained utilizing NASCET criteria, using the distal internal carotid diameter as the denominator. CONTRAST:  100mL OMNIPAQUE IOHEXOL 350 MG/ML SOLN COMPARISON:  CT head 11/03/2019.  MRI head 11/03/2019 FINDINGS: CT HEAD FINDINGS Brain: Interval development of hypodensity right medial thalamus compatible with acute infarct as noted on MRI. No associated hemorrhage. No other infarct. No hemorrhage or mass. Ventricle size normal. Small subdural hygroma left frontal lobe unchanged. Vascular: Negative for hyperdense vessel Skull: Negative Sinuses: Air-fluid level sphenoid sinus. Mild mucosal edema remaining paranasal sinuses. Orbits: Bilateral cataract surgery.  No mass lesion. Review of the MIP images confirms the above findings CTA NECK FINDINGS Aortic arch: Standard branching. Imaged portion shows no evidence of aneurysm or dissection. No significant stenosis of the major arch vessel origins. Atherosclerotic calcification aortic arch. Right carotid system: Mild atherosclerotic disease right carotid bifurcation without stenosis Left carotid system: Mild atherosclerotic disease left carotid bifurcation without stenosis. Vertebral arteries: Small vertebral arteries are patent to the basilar without stenosis. Skeleton: Degenerative changes cervical spine. No acute skeletal abnormality. Other neck: Negative for mass or adenopathy Upper chest: Lung apices clear bilaterally. Review of the MIP images confirms the above findings CTA HEAD FINDINGS Anterior circulation: Atherosclerotic calcification in the cavernous carotid bilaterally without significant stenosis. Anterior and middle cerebral arteries patent bilaterally without stenosis. Posterior circulation: Both vertebral arteries patent to the  basilar. PICA patent bilaterally. Basilar widely patent. Superior cerebellar and posterior cerebral arteries patent bilaterally. Fetal origin of the posterior cerebral artery bilaterally. Venous sinuses: Negative Anatomic variants: None Review of the MIP images confirms the above findings IMPRESSION: 1. Acute infarct right thalamus.  Negative for hemorrhage. 2. No significant carotid or vertebral artery stenosis in the neck. Mild atherosclerotic disease in the carotid bifurcation bilaterally 3. Negative for intracranial large vessel occlusion. Electronically Signed   By: Marlan Palauharles  Clark M.D.   On: 11/04/2019 13:39   MR Brain Wo Contrast (neuro protocol)  Result Date: 11/03/2019 CLINICAL DATA:  74 year old female with dizziness upon waking today. Bradycardia. EXAM: MRI HEAD WITHOUT CONTRAST TECHNIQUE: Multiplanar, multiecho pulse sequences of the brain and surrounding structures were obtained without intravenous contrast. COMPARISON:  Head CT earlier today. FINDINGS: Brain: There is mildly asymmetric subarachnoid space CSF redemonstrated along the left anterior frontal convexity. No evidence of extra-axial blood or collection. No areas of disproportion cerebral atrophy identified. Confluent 12 mm focus of restricted diffusion in the anteromedial right thalamus on series 5, image 72. Associated faint T2 and FLAIR hyperintensity. No hemorrhage or mass effect. No other restricted diffusion. And elsewhere gray and white matter signal is normal for age throughout the brain. No cortical encephalomalacia or chronic cerebral blood products. No midline shift, mass effect, evidence of mass lesion, ventriculomegaly, extra-axial collection or acute intracranial  hemorrhage. Cervicomedullary junction and pituitary are within normal limits. Vascular: Major intracranial vascular flow voids are preserved. Skull and upper cervical spine: Negative for age visible cervical spine. Normal bone marrow signal. Sinuses/Orbits:  Postoperative changes to both globes otherwise negative orbits. Trace paranasal sinus mucosal thickening, small fluid level in the right sphenoid sinus. Other: Mastoids are well pneumatized. Visible internal auditory structures appear normal. Normal stylomastoid foramina. IMPRESSION: 1. Acute lacunar infarct in the right thalamus. No associated hemorrhage or mass effect. 2. But elsewhere normal for age noncontrast MRI appearance of the brain. Electronically Signed   By: Odessa Fleming M.D.   On: 11/03/2019 15:41   ECHOCARDIOGRAM COMPLETE  Result Date: 11/04/2019    ECHOCARDIOGRAM REPORT   Patient Name:   JAYAH BALTHAZAR Date of Exam: 11/04/2019 Medical Rec #:  [id number]           Height: Accession #:    [id number]          Weight: Date of Birth:  1946/07/19           BSA: Patient Age:    73 years            BP:           144/62 mmHg Patient Gender: F                   HR:           54 bpm. Exam Location:  Inpatient Procedure: 2D Echo Indications:    Stroke 434.91 / I163.9  History:        Patient has no prior history of Echocardiogram examinations.                 Risk Factors:Hypertension, Dyslipidemia and Obesity. CKD.  Sonographer:    Leeroy Bock Turrentine Referring Phys: [id number] DANIEL V THOMPSON IMPRESSIONS  1. Left ventricular ejection fraction, by estimation, is 60 to 65%. The left ventricle has normal function. The left ventricle has no regional wall motion abnormalities. Left ventricular diastolic parameters were normal.  2. Right ventricular systolic function is normal. The right ventricular size is normal.  3. The mitral valve is normal in structure and function. No evidence of mitral valve regurgitation. No evidence of mitral stenosis.  4. The aortic valve is tricuspid. Aortic valve regurgitation is trivial. Mild aortic valve sclerosis is present, with no evidence of aortic valve stenosis.  5. The inferior vena cava is normal in size with greater than 50% respiratory variability, suggesting right atrial  pressure of 3 mmHg. FINDINGS  Left Ventricle: Left ventricular ejection fraction, by estimation, is 60 to 65%. The left ventricle has normal function. The left ventricle has no regional wall motion abnormalities. The left ventricular internal cavity size was normal in size. There is  no left ventricular hypertrophy. Left ventricular diastolic parameters were normal. Normal left ventricular filling pressure. Right Ventricle: The right ventricular size is normal. No increase in right ventricular wall thickness. Right ventricular systolic function is normal. Left Atrium: Left atrial size was normal in size. Right Atrium: Right atrial size was normal in size. Pericardium: There is no evidence of pericardial effusion. Mitral Valve: The mitral valve is normal in structure and function. Normal mobility of the mitral valve leaflets. No evidence of mitral valve regurgitation. No evidence of mitral valve stenosis. Tricuspid Valve: The tricuspid valve is normal in structure. Tricuspid valve regurgitation is not demonstrated. No evidence of tricuspid stenosis. Aortic Valve: The aortic valve is tricuspid. Aortic valve regurgitation is trivial.  Mild aortic valve sclerosis is present, with no evidence of aortic valve stenosis. Pulmonic Valve: The pulmonic valve was normal in structure. Pulmonic valve regurgitation is trivial. No evidence of pulmonic stenosis. Aorta: The aortic root is normal in size and structure. Venous: The inferior vena cava is normal in size with greater than 50% respiratory variability, suggesting right atrial pressure of 3 mmHg. IAS/Shunts: No atrial level shunt detected by color flow Doppler.  LEFT VENTRICLE PLAX 2D LVIDd:         3.90 cm    Diastology LVIDs:         2.54 cm    LV e' lateral:   11.70 cm/s LVOT diam:     1.90 cm    LV E/e' lateral: 6.2 LV SV:         6747.98 ml LV e' medial:    8.70 cm/s LVOT Area:     2.84 cm   LV E/e' medial:  8.4  LEFT ATRIUM LA Vol (A2C): 42.8 ml LA Vol (A4C): 45.5 ml   AORTIC VALVE LVOT Vmax:   11100.00 cm/s LVOT Vmean:  7070.000 cm/s LVOT VTI:    23.800 m MITRAL VALVE MV Area (PHT): 10.54 cm     SHUNTS MV Decel Time: 72 msec       Systemic VTI:  23.80 m MV E velocity: 73.00 cm/s    Systemic Diam: 1.90 cm MV A velocity: 9400.00 cm/s MV E/A ratio:  0.01 Armanda Magic MD Electronically signed by Armanda Magic MD Signature Date/Time: 11/04/2019/12:27:24 PM    Final       Current Outpatient Medications (Cardiovascular):  .  amLODipine (NORVASC) 5 MG tablet, Take 1 tablet (5 mg total) by mouth daily. Marland Kitchen  atorvastatin (LIPITOR) 80 MG tablet, Take 1 tablet (80 mg total) by mouth daily at 6 PM.  Current Outpatient Medications (Respiratory):  .  fluticasone (FLONASE) 50 MCG/ACT nasal spray, Place 2 sprays into both nostrils daily as needed.  Current Outpatient Medications (Analgesics):  .  aspirin EC 81 MG EC tablet, Take 1 tablet (81 mg total) by mouth daily.  Current Outpatient Medications (Hematological):  .  clopidogrel (PLAVIX) 75 MG tablet, Take 1 tablet (75 mg total) by mouth daily for 21 days.   Past Medical History:  Diagnosis Date  . Cataract   . GERD (gastroesophageal reflux disease)   . Hyperlipidemia   . Hypertension 1995  . IBS (irritable bowel syndrome)   . Morbid obesity (HCC)   . 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 Posterior vitreous detachment 10/29/2015  . Prediabetes 2016  . Venous insufficiency      Allergies  Allergen Reactions  . Ppd [Tuberculin Purified Protein Derivative]   . Sulfa Antibiotics   . Tolectin [Tolmetin]     ROS: all negative except above.   Physical Exam: Filed Weights   11/19/19 1028  Weight: 235 lb 9.6 oz (106.9 kg)   BP 126/78   Pulse 71   Temp (!) 97.2 F (36.2 C)   Wt 235 lb 9.6 oz (106.9 kg)   SpO2 98%   BMI 34.79 kg/m  General Appearance: Well nourished, in no apparent distress. Eyes: PERRLA, EOMs, conjunctiva no swelling or erythema ENT/Mouth: Ext aud canals clear, TMs without erythema,  bulging. No erythema, swelling, or exudate on post pharynx.  Tonsils not swollen or erythematous. Hearing normal.  Neck: Supple, thyroid normal.  Respiratory: Respiratory effort normal, BS equal bilaterally without rales, rhonchi, wheezing or stridor.  Cardio: RRR with no MRGs.  Brisk peripheral pulses without edema.  Abdomen: Soft, + BS.  Non tender, no guarding, rebound, hernias, masses. Lymphatics: Non tender without lymphadenopathy.  Musculoskeletal: Full ROM, 5/5 strength, slow steady gait.  Skin: Warm, dry without rashes, lesions, ecchymosis.  Neuro: Cranial nerves intact. Normal muscle tone, no cerebellar symptoms. Sensation intact.  Psych: Awake and oriented X 3, normal affect, Insight and Judgment appropriate.     Izora Ribas, NP 11:10 AM [facility]

## 2019-11-19 ENCOUNTER — Encounter: Payer: Self-pay | Admitting: Adult Health

## 2019-11-19 ENCOUNTER — Ambulatory Visit: Payer: Medicare Other | Admitting: Adult Health

## 2019-11-19 ENCOUNTER — Other Ambulatory Visit: Payer: Self-pay

## 2019-11-19 VITALS — BP 126/78 | HR 71 | Temp 97.2°F | Wt 235.6 lb

## 2019-11-19 DIAGNOSIS — E782 Mixed hyperlipidemia: Secondary | ICD-10-CM

## 2019-11-19 DIAGNOSIS — J309 Allergic rhinitis, unspecified: Secondary | ICD-10-CM

## 2019-11-19 DIAGNOSIS — I10 Essential (primary) hypertension: Secondary | ICD-10-CM | POA: Diagnosis not present

## 2019-11-19 DIAGNOSIS — N390 Urinary tract infection, site not specified: Secondary | ICD-10-CM

## 2019-11-19 DIAGNOSIS — I639 Cerebral infarction, unspecified: Secondary | ICD-10-CM

## 2019-11-19 DIAGNOSIS — I6381 Other cerebral infarction due to occlusion or stenosis of small artery: Secondary | ICD-10-CM

## 2019-11-19 LAB — COMPLETE METABOLIC PANEL WITH GFR
AG Ratio: 1.1 (calc) (ref 1.0–2.5)
ALT: 8 U/L (ref 6–29)
AST: 19 U/L (ref 10–35)
Albumin: 3.9 g/dL (ref 3.6–5.1)
Alkaline phosphatase (APISO): 84 U/L (ref 37–153)
BUN/Creatinine Ratio: 6 (calc) (ref 6–22)
BUN: 7 mg/dL (ref 7–25)
CO2: 29 mmol/L (ref 20–32)
Calcium: 9.6 mg/dL (ref 8.6–10.4)
Chloride: 103 mmol/L (ref 98–110)
Creat: 1.19 mg/dL — ABNORMAL HIGH (ref 0.60–0.93)
GFR, Est African American: 53 mL/min/{1.73_m2} — ABNORMAL LOW (ref >=60)
GFR, Est Non African American: 46 mL/min/{1.73_m2} — ABNORMAL LOW (ref >=60)
Globulin: 3.7 g/dL (calc) (ref 1.9–3.7)
Glucose, Bld: 101 mg/dL — ABNORMAL HIGH (ref 65–99)
Potassium: 4.1 mmol/L (ref 3.5–5.3)
Sodium: 140 mmol/L (ref 135–146)
Total Bilirubin: 0.4 mg/dL (ref 0.2–1.2)
Total Protein: 7.6 g/dL (ref 6.1–8.1)

## 2019-11-19 LAB — CBC WITH DIFFERENTIAL/PLATELET
Absolute Monocytes: 254 cells/uL (ref 200–950)
Basophils Absolute: 21 cells/uL (ref 0–200)
Basophils Relative: 0.4 %
Eosinophils Absolute: 80 cells/uL (ref 15–500)
Eosinophils Relative: 1.5 %
HCT: 39.7 % (ref 35.0–45.0)
Hemoglobin: 13.1 g/dL (ref 11.7–15.5)
Lymphs Abs: 1765 cells/uL (ref 850–3900)
MCH: 30.7 pg (ref 27.0–33.0)
MCHC: 33 g/dL (ref 32.0–36.0)
MCV: 93 fL (ref 80.0–100.0)
MPV: 9.6 fL (ref 7.5–12.5)
Monocytes Relative: 4.8 %
Neutro Abs: 3180 cells/uL (ref 1500–7800)
Neutrophils Relative %: 60 %
Platelets: 309 10*3/uL (ref 140–400)
RBC: 4.27 10*6/uL (ref 3.80–5.10)
RDW: 12.8 % (ref 11.0–15.0)
Total Lymphocyte: 33.3 %
WBC: 5.3 10*3/uL (ref 3.8–10.8)

## 2019-11-19 LAB — TSH: TSH: 0.99 mIU/L (ref 0.40–4.50)

## 2019-11-19 MED ORDER — ATORVASTATIN CALCIUM 80 MG PO TABS: 80.0000 mg | ORAL_TABLET | Freq: Every day | ORAL | 0 refills | Status: DC

## 2019-11-19 MED ORDER — FLUTICASONE PROPIONATE 50 MCG/ACT NA SUSP: 2.0000 | Freq: Every day | NASAL | 2 refills | Status: DC | PRN

## 2019-11-19 MED ORDER — AMLODIPINE BESYLATE 5 MG PO TABS: 5.0000 mg | ORAL_TABLET | Freq: Every day | ORAL | 0 refills | Status: DC

## 2019-11-19 NOTE — Patient Instructions (Addendum)
Goals    . Exercise 150 min/wk Moderate Activity    . Weight (lb) < 200 lb (90.7 kg)        Try claritin, allegra, zyrtec or any of the generic daily for your allergies, runny nose  Avoid the "-D"  Due to blood pressure   Complete the clopidogrel then stop - unless neurology advises otherwise    Continue aspirin, amlodipine and atorvastatin as daily medication  Look into starting water aerobics, try joining silver sneakers, aim to get in at least 30 min of exercise daily   Please ask about physical therapy, strongly recommend this for balance       Covid 19 vaccine information  Currently, there is a hotline to call to schedule vaccination appointments as no walk-ins will be accepted.  Number: [phone].       If an appointment is not available please go to [email or web address] to sign up for notification when additional vaccine appointments are available.   [facility] is going to start caring the vaccine for patients 65 and older so please check in with your local pharmacy to see if they will be starting to give the vaccines.     If you have further questions or concerns about the vaccine process, please visit [email or web address]  Individuals seeking information about the vaccines and state's phased distribution plan can learn more by going to - [email or web address]  [email or web address]

## 2019-11-21 LAB — URINALYSIS W MICROSCOPIC + REFLEX CULTURE
Bacteria, UA: NONE SEEN /HPF
Bilirubin Urine: NEGATIVE
Glucose, UA: NEGATIVE
Hyaline Cast: NONE SEEN /LPF
Ketones, ur: NEGATIVE
Nitrites, Initial: NEGATIVE
Protein, ur: NEGATIVE
Specific Gravity, Urine: 1.009 (ref 1.001–1.03)
pH: 6 (ref 5.0–8.0)

## 2019-11-21 LAB — URINE CULTURE
MICRO NUMBER:: 10235480
Result:: NO GROWTH
SPECIMEN QUALITY:: ADEQUATE

## 2019-11-21 LAB — CULTURE INDICATED

## 2019-11-22 DIAGNOSIS — E782 Mixed hyperlipidemia: Secondary | ICD-10-CM | POA: Diagnosis not present

## 2019-11-22 DIAGNOSIS — I129 Hypertensive chronic kidney disease with stage 1 through stage 4 chronic kidney disease, or unspecified chronic kidney disease: Secondary | ICD-10-CM | POA: Diagnosis not present

## 2019-11-22 DIAGNOSIS — R42 Dizziness and giddiness: Secondary | ICD-10-CM | POA: Diagnosis not present

## 2019-11-22 DIAGNOSIS — N1831 Chronic kidney disease, stage 3a: Secondary | ICD-10-CM | POA: Diagnosis not present

## 2019-11-22 DIAGNOSIS — M47812 Spondylosis without myelopathy or radiculopathy, cervical region: Secondary | ICD-10-CM | POA: Diagnosis not present

## 2019-11-22 DIAGNOSIS — I872 Venous insufficiency (chronic) (peripheral): Secondary | ICD-10-CM | POA: Diagnosis not present

## 2019-11-22 DIAGNOSIS — K589 Irritable bowel syndrome without diarrhea: Secondary | ICD-10-CM | POA: Diagnosis not present

## 2019-11-22 DIAGNOSIS — K219 Gastro-esophageal reflux disease without esophagitis: Secondary | ICD-10-CM | POA: Diagnosis not present

## 2019-11-26 DIAGNOSIS — M47812 Spondylosis without myelopathy or radiculopathy, cervical region: Secondary | ICD-10-CM | POA: Diagnosis not present

## 2019-11-26 DIAGNOSIS — I129 Hypertensive chronic kidney disease with stage 1 through stage 4 chronic kidney disease, or unspecified chronic kidney disease: Secondary | ICD-10-CM | POA: Diagnosis not present

## 2019-11-26 DIAGNOSIS — K589 Irritable bowel syndrome without diarrhea: Secondary | ICD-10-CM | POA: Diagnosis not present

## 2019-11-26 DIAGNOSIS — R42 Dizziness and giddiness: Secondary | ICD-10-CM | POA: Diagnosis not present

## 2019-11-26 DIAGNOSIS — E782 Mixed hyperlipidemia: Secondary | ICD-10-CM | POA: Diagnosis not present

## 2019-11-26 DIAGNOSIS — I872 Venous insufficiency (chronic) (peripheral): Secondary | ICD-10-CM | POA: Diagnosis not present

## 2019-11-26 DIAGNOSIS — K219 Gastro-esophageal reflux disease without esophagitis: Secondary | ICD-10-CM | POA: Diagnosis not present

## 2019-11-26 DIAGNOSIS — N1831 Chronic kidney disease, stage 3a: Secondary | ICD-10-CM | POA: Diagnosis not present

## 2019-12-04 DIAGNOSIS — K589 Irritable bowel syndrome without diarrhea: Secondary | ICD-10-CM | POA: Diagnosis not present

## 2019-12-04 DIAGNOSIS — I129 Hypertensive chronic kidney disease with stage 1 through stage 4 chronic kidney disease, or unspecified chronic kidney disease: Secondary | ICD-10-CM | POA: Diagnosis not present

## 2019-12-04 DIAGNOSIS — N1831 Chronic kidney disease, stage 3a: Secondary | ICD-10-CM | POA: Diagnosis not present

## 2019-12-04 DIAGNOSIS — K219 Gastro-esophageal reflux disease without esophagitis: Secondary | ICD-10-CM | POA: Diagnosis not present

## 2019-12-04 DIAGNOSIS — E782 Mixed hyperlipidemia: Secondary | ICD-10-CM | POA: Diagnosis not present

## 2019-12-04 DIAGNOSIS — I872 Venous insufficiency (chronic) (peripheral): Secondary | ICD-10-CM | POA: Diagnosis not present

## 2019-12-04 DIAGNOSIS — R42 Dizziness and giddiness: Secondary | ICD-10-CM | POA: Diagnosis not present

## 2019-12-04 DIAGNOSIS — M47812 Spondylosis without myelopathy or radiculopathy, cervical region: Secondary | ICD-10-CM | POA: Diagnosis not present

## 2019-12-05 ENCOUNTER — Encounter: Payer: Self-pay | Admitting: Adult Health

## 2019-12-05 ENCOUNTER — Other Ambulatory Visit: Payer: Self-pay

## 2019-12-05 ENCOUNTER — Ambulatory Visit: Payer: Medicare PPO | Admitting: Adult Health

## 2019-12-05 VITALS — BP 157/86 | HR 76 | Temp 97.5°F | Ht 69.0 in | Wt 234.0 lb

## 2019-12-05 DIAGNOSIS — I639 Cerebral infarction, unspecified: Secondary | ICD-10-CM

## 2019-12-05 DIAGNOSIS — I10 Essential (primary) hypertension: Secondary | ICD-10-CM

## 2019-12-05 DIAGNOSIS — H5000 Unspecified esotropia: Secondary | ICD-10-CM | POA: Diagnosis not present

## 2019-12-05 DIAGNOSIS — R7303 Prediabetes: Secondary | ICD-10-CM | POA: Diagnosis not present

## 2019-12-05 DIAGNOSIS — E785 Hyperlipidemia, unspecified: Secondary | ICD-10-CM | POA: Diagnosis not present

## 2019-12-05 DIAGNOSIS — I6381 Other cerebral infarction due to occlusion or stenosis of small artery: Secondary | ICD-10-CM

## 2019-12-05 NOTE — Patient Instructions (Signed)
Continue aspirin 81 mg daily  and atorvastatin  for secondary stroke prevention  Continue to follow up with PCP regarding blood pressure and cholesterol management   Schedule follow up visit with your eye doctor for further evaluation   Ensure you start physical therapy at home to help with daily functioning   Continue to monitor blood pressure at home  Maintain strict control of hypertension with blood pressure goal below 130/90, diabetes with hemoglobin A1c goal below 6.5% and cholesterol with LDL cholesterol (bad cholesterol) goal below 70 mg/dL. I also advised the patient to eat a healthy diet with plenty of whole grains, cereals, fruits and vegetables, exercise regularly and maintain ideal body weight.  Followup in the future with me in 3 months or call earlier if needed       Thank you for coming to see [place] at [facility]. I hope we have been able to provide you high quality care today.  You may receive a patient satisfaction survey over the next few weeks. We would appreciate your feedback and comments so that we may continue to improve ourselves and the health of our patients.

## 2019-12-05 NOTE — Progress Notes (Signed)
[facility] [address] [place]. [place] [address] [phone]       HOSPITAL FOLLOW UP NOTE  Ms. Susan Fernandez Date of Birth:  1946-05-28 Medical Record Number:  [id number]   Reason for Referral:  hospital stroke follow up    CHIEF COMPLAINT:  Chief Complaint  Patient presents with  . Follow-up    Rm9. with niece. Hosp. f/u Doing well. No questions nor concerns.    HPI: Susan Richardsonis being seen today for in office hospital follow-up regarding right thalamic stroke secondary to small vessel disease on 11/03/2019.  History obtained from patient, niece and chart review. Reviewed all radiology images and labs personally.  Ms. Susan Fernandez is a 74 y.o. female with history of hyperlipidemia, hypertension, prediabetes  who presented on 11/03/2019 with vision changes and dizziness present on awakening. Evaluated by stroke team and Dr. Erlinda Hong with stroke work-up revealing right thalamic infarct likely secondary to small vessel disease source. Initiated DAPT for 3 weeks and aspirin alone. HTN stable. LDL 230 and initiated atorvastatin 80 mg daily. Prediabetes with A1c 5.9. No prior history of stroke. Residual deficits of mild left facial weakness and mild LUE weakness and discharged home with recommendation of home health therapy.  Stroke:   R thalamic infarct likely secondary to small vessel disease source  CT head No acute abnormality.   MRI  Acute R thalamic lacune  CTA head & neck No significant atherosclerosis or LVO.   2D Echo EF 60-65%. No source of embolus   LDL 230 -initiate atorvastatin 80 mg daily  HgbA1c 5.9  Lovenox 40 mg sq daily for VTE prophylaxis  No antithrombotic prior to admission, now on aspirin 81 mg daily and clopidogrel 75 mg daily following plavix load.  Recommend DAPT for 3 weeks and then aspirin alone.  Therapy recommendations:  HH PT  Disposition:  Return home   She is accompanied today by her niece.  She reports  ongoing difficulty with strabismus-like symptoms which can worsen with movement or activity.  She does report chronic dizziness symptoms which have been stable without worsening post stroke.  She will be starting home health therapy in the next couple of days.  Niece is currently living with her to provide assistance as needed. She has completed 3 weeks of DAPT and continues on aspirin alone without bleeding or bruising. Continues on atorvastatin 80mg  daily without myalgias.  Blood pressure today 157/86 but does monitor at home and has been stable.  No further concerns at this time.      ROS:   14 system review of systems performed and negative with exception of dizziness, visual impairment  PMH:  Past Medical History:  Diagnosis Date  . Cataract   . GERD (gastroesophageal reflux disease)   . Hyperlipidemia   . Hypertension 1995  . IBS (irritable bowel syndrome)   . Morbid obesity ([place])   . 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 Posterior vitreous detachment 10/29/2015  . Prediabetes 2016  . Right thalamic infarction ([place]) 11/03/2019   Lacunar R thalamic infarction without hemorrhage or shift per MRI 11/03/2019  . Venous insufficiency     PSH:  Past Surgical History:  Procedure Laterality Date  . CATARACT EXTRACTION, BILATERAL Bilateral 2018   L- 01/2017, R-02/2017  . RETINAL TEAR REPAIR CRYOTHERAPY Bilateral 2000   Dr. Ishmael Holter    Social History:  Social History   Socioeconomic History  . Marital status: Single    Spouse name: Not on file  . Number  of children: Not on file  . Years of education: Not on file  . Highest education level: Not on file  Occupational History  . Not on file  Tobacco Use  . Smoking status: Never Smoker  . Smokeless tobacco: Never Used  Substance and Sexual Activity  . Alcohol use: Not on file  . Drug use: Not on file  . Sexual activity: Not on file  Other Topics Concern  . Not on file  Social History Narrative  . Not on file   Social  Determinants of Health   Financial Resource Strain:   . Difficulty of Paying Living Expenses:   Food Insecurity:   . Worried About Programme researcher, broadcasting/film/video in the Last Year:   . Barista in the Last Year:   Transportation Needs:   . Freight forwarder (Medical):   Marland Kitchen Lack of Transportation (Non-Medical):   Physical Activity:   . Days of Exercise per Week:   . Minutes of Exercise per Session:   Stress:   . Feeling of Stress :   Social Connections:   . Frequency of Communication with Friends and Family:   . Frequency of Social Gatherings with Friends and Family:   . Attends Religious Services:   . Active Member of Clubs or Organizations:   . Attends Banker Meetings:   Marland Kitchen Marital Status:   Intimate Partner Violence:   . Fear of Current or Ex-Partner:   . Emotionally Abused:   Marland Kitchen Physically Abused:   . Sexually Abused:     Family History: No family history on file.  Medications:   Current Outpatient Medications on File Prior to Visit  Medication Sig Dispense Refill  . amLODipine (NORVASC) 5 MG tablet Take 1 tablet (5 mg total) by mouth daily. 90 tablet 0  . aspirin EC 81 MG EC tablet Take 1 tablet (81 mg total) by mouth daily.    Marland Kitchen atorvastatin (LIPITOR) 80 MG tablet Take 1 tablet (80 mg total) by mouth daily at 6 PM. 90 tablet 0  . fluticasone (FLONASE) 50 MCG/ACT nasal spray Place 2 sprays into both nostrils daily as needed. 16 g 2   No current facility-administered medications on file prior to visit.    Allergies:   Allergies  Allergen Reactions  . Ppd [Tuberculin Purified Protein Derivative]   . Sulfa Antibiotics   . Tolectin [Tolmetin]      Physical Exam  Vitals:   12/05/19 0958  BP: (!) 157/86  Pulse: 76  Temp: (!) 97.5 F (36.4 C)  Weight: 234 lb (106.1 kg)  Height: 5\' 9"  (1.753 m)   Body mass index is 34.56 kg/m. No exam data present   General: Obese pleasant elderly African-American female, seated, in no evident distress Head:  head normocephalic and atraumatic.   Neck: supple with no carotid or supraclavicular bruits Cardiovascular: regular rate and rhythm, no murmurs Musculoskeletal: no deformity Skin:  no rash/petichiae Vascular:  Normal pulses all extremities   Neurologic Exam Mental Status: Awake and fully alert.   Normal speech and language.  Oriented to place and time. Recent and remote memory intact. Attention span, concentration and fund of knowledge appropriate. Mood and affect appropriate.  Cranial Nerves: Fundoscopic exam reveals sharp disc margins. Pupils equal, briskly reactive to light. Extraocular movements full without nystagmus with left eye esotropia. Visual fields full to confrontation. Hearing intact. Facial sensation intact. Face, tongue, palate moves normally and symmetrically.  Motor: Normal bulk and tone. Normal strength in  all tested extremity muscles. Sensory.: intact to touch , pinprick , position and vibratory sensation.  Coordination: Rapid alternating movements normal in all extremities. Finger-to-nose and heel-to-shin performed accurately bilaterally. Gait and Station: Arises from chair without difficulty. Stance is normal. Gait demonstrates normal stride length and balance.  Unable to perform tandem walk.  Romberg negative. Reflexes: 1+ and symmetric. Toes downgoing.     NIHSS  0 Modified Rankin  2     ASSESSMENT: Susan Fernandez is a 74 y.o. year old female presented with vision changes and dizziness upon awakening on 11/03/2019 with stroke work-up revealing right thalamic infarct secondary to small vessel disease source. Vascular risk factors include HTN, HLD and prediabetes.  Residual deficits of strabismus    PLAN:  1. Right thalamic stroke:  -Continue aspirin 81 mg daily  and atorvastatin 80 mg daily for secondary stroke prevention. -Strabismus and left eye esotropia, poststroke: Recommend initiating therapy as well as scheduling follow-up with  ophthalmology -Maintain strict control of hypertension with blood pressure goal below 130/90, diabetes with hemoglobin A1c goal below 6.5% and cholesterol with LDL cholesterol (bad cholesterol) goal below 70 mg/dL.  I also advised the patient to eat a healthy diet with plenty of whole grains, cereals, fruits and vegetables, exercise regularly with at least 30 minutes of continuous activity daily and maintain ideal body weight. 2. HTN: Ongoing management by PCP 3. HLD: Ongoing management by PCP 4. Pre-DMII: Ongoing monitoring by PCP 5. Dizziness, chronic: Denies worsening post stroke.  Continue to follow with PCP    Follow up in 3 months or call earlier if needed   I spent 40 minutes of face-to-face and non-face-to-face time with patient.  This included previsit chart review, lab review, study review, order entry, electronic health record documentation, patient education   Ihor Austin, [facility]  [facility] [address] [place], [place] [address]  Phone [phone] Fax [phone] Note: This document was prepared with digital dictation and possible smart phrase technology. Any transcriptional errors that result from this process are unintentional.

## 2019-12-09 DIAGNOSIS — K219 Gastro-esophageal reflux disease without esophagitis: Secondary | ICD-10-CM | POA: Diagnosis not present

## 2019-12-09 DIAGNOSIS — K589 Irritable bowel syndrome without diarrhea: Secondary | ICD-10-CM | POA: Diagnosis not present

## 2019-12-09 DIAGNOSIS — M47812 Spondylosis without myelopathy or radiculopathy, cervical region: Secondary | ICD-10-CM | POA: Diagnosis not present

## 2019-12-09 DIAGNOSIS — R42 Dizziness and giddiness: Secondary | ICD-10-CM | POA: Diagnosis not present

## 2019-12-09 DIAGNOSIS — E782 Mixed hyperlipidemia: Secondary | ICD-10-CM | POA: Diagnosis not present

## 2019-12-09 DIAGNOSIS — I129 Hypertensive chronic kidney disease with stage 1 through stage 4 chronic kidney disease, or unspecified chronic kidney disease: Secondary | ICD-10-CM | POA: Diagnosis not present

## 2019-12-09 DIAGNOSIS — I872 Venous insufficiency (chronic) (peripheral): Secondary | ICD-10-CM | POA: Diagnosis not present

## 2019-12-09 DIAGNOSIS — N1831 Chronic kidney disease, stage 3a: Secondary | ICD-10-CM | POA: Diagnosis not present

## 2019-12-12 DIAGNOSIS — K589 Irritable bowel syndrome without diarrhea: Secondary | ICD-10-CM | POA: Diagnosis not present

## 2019-12-12 DIAGNOSIS — M47812 Spondylosis without myelopathy or radiculopathy, cervical region: Secondary | ICD-10-CM | POA: Diagnosis not present

## 2019-12-12 DIAGNOSIS — I872 Venous insufficiency (chronic) (peripheral): Secondary | ICD-10-CM | POA: Diagnosis not present

## 2019-12-12 DIAGNOSIS — N1831 Chronic kidney disease, stage 3a: Secondary | ICD-10-CM | POA: Diagnosis not present

## 2019-12-12 DIAGNOSIS — I129 Hypertensive chronic kidney disease with stage 1 through stage 4 chronic kidney disease, or unspecified chronic kidney disease: Secondary | ICD-10-CM | POA: Diagnosis not present

## 2019-12-12 DIAGNOSIS — E782 Mixed hyperlipidemia: Secondary | ICD-10-CM | POA: Diagnosis not present

## 2019-12-12 DIAGNOSIS — R42 Dizziness and giddiness: Secondary | ICD-10-CM | POA: Diagnosis not present

## 2019-12-12 DIAGNOSIS — K219 Gastro-esophageal reflux disease without esophagitis: Secondary | ICD-10-CM | POA: Diagnosis not present

## 2019-12-13 NOTE — Progress Notes (Signed)
I agree with the above plan 

## 2019-12-24 DIAGNOSIS — M47812 Spondylosis without myelopathy or radiculopathy, cervical region: Secondary | ICD-10-CM | POA: Diagnosis not present

## 2019-12-24 DIAGNOSIS — K219 Gastro-esophageal reflux disease without esophagitis: Secondary | ICD-10-CM | POA: Diagnosis not present

## 2019-12-24 DIAGNOSIS — E782 Mixed hyperlipidemia: Secondary | ICD-10-CM | POA: Diagnosis not present

## 2019-12-24 DIAGNOSIS — R42 Dizziness and giddiness: Secondary | ICD-10-CM | POA: Diagnosis not present

## 2019-12-24 DIAGNOSIS — N1831 Chronic kidney disease, stage 3a: Secondary | ICD-10-CM | POA: Diagnosis not present

## 2019-12-24 DIAGNOSIS — I872 Venous insufficiency (chronic) (peripheral): Secondary | ICD-10-CM | POA: Diagnosis not present

## 2019-12-24 DIAGNOSIS — I129 Hypertensive chronic kidney disease with stage 1 through stage 4 chronic kidney disease, or unspecified chronic kidney disease: Secondary | ICD-10-CM | POA: Diagnosis not present

## 2019-12-24 DIAGNOSIS — K589 Irritable bowel syndrome without diarrhea: Secondary | ICD-10-CM | POA: Diagnosis not present

## 2020-01-15 DIAGNOSIS — H532 Diplopia: Secondary | ICD-10-CM | POA: Diagnosis not present

## 2020-01-15 DIAGNOSIS — Z961 Presence of intraocular lens: Secondary | ICD-10-CM | POA: Diagnosis not present

## 2020-01-15 DIAGNOSIS — H43812 Vitreous degeneration, left eye: Secondary | ICD-10-CM | POA: Diagnosis not present

## 2020-01-15 DIAGNOSIS — H31093 Other chorioretinal scars, bilateral: Secondary | ICD-10-CM | POA: Diagnosis not present

## 2020-01-15 DIAGNOSIS — H26491 Other secondary cataract, right eye: Secondary | ICD-10-CM | POA: Diagnosis not present

## 2020-01-15 DIAGNOSIS — H26492 Other secondary cataract, left eye: Secondary | ICD-10-CM | POA: Diagnosis not present

## 2020-02-09 ENCOUNTER — Other Ambulatory Visit: Payer: Self-pay | Admitting: Adult Health

## 2020-02-09 DIAGNOSIS — J309 Allergic rhinitis, unspecified: Secondary | ICD-10-CM

## 2020-02-17 ENCOUNTER — Encounter: Payer: Medicare Other | Admitting: Physician Assistant

## 2020-02-23 ENCOUNTER — Other Ambulatory Visit: Payer: Self-pay | Admitting: Adult Health

## 2020-02-23 DIAGNOSIS — E782 Mixed hyperlipidemia: Secondary | ICD-10-CM

## 2020-02-23 DIAGNOSIS — I10 Essential (primary) hypertension: Secondary | ICD-10-CM

## 2020-02-27 ENCOUNTER — Ambulatory Visit: Payer: Medicare PPO | Admitting: Adult Health Nurse Practitioner

## 2020-02-27 ENCOUNTER — Ambulatory Visit: Payer: Medicare PPO | Admitting: Adult Health

## 2020-03-17 ENCOUNTER — Encounter: Payer: Medicare Other | Admitting: Physician Assistant

## 2020-03-25 ENCOUNTER — Ambulatory Visit: Payer: Medicare PPO | Admitting: Adult Health

## 2020-04-03 ENCOUNTER — Emergency Department (HOSPITAL_COMMUNITY): Payer: Medicare PPO

## 2020-04-03 ENCOUNTER — Inpatient Hospital Stay (HOSPITAL_COMMUNITY)
Admission: EM | Admit: 2020-04-03 | Discharge: 2020-04-10 | DRG: 378 | Disposition: A | Discharge status: Skilled Nursing Facility | Payer: Medicare PPO | Attending: Internal Medicine | Admitting: Internal Medicine

## 2020-04-03 ENCOUNTER — Other Ambulatory Visit: Payer: Self-pay

## 2020-04-03 ENCOUNTER — Encounter (HOSPITAL_COMMUNITY): Payer: Self-pay | Admitting: Pharmacy Technician

## 2020-04-03 DIAGNOSIS — Z9841 Cataract extraction status, right eye: Secondary | ICD-10-CM | POA: Diagnosis not present

## 2020-04-03 DIAGNOSIS — E782 Mixed hyperlipidemia: Secondary | ICD-10-CM | POA: Diagnosis not present

## 2020-04-03 DIAGNOSIS — K254 Chronic or unspecified gastric ulcer with hemorrhage: Secondary | ICD-10-CM | POA: Diagnosis not present

## 2020-04-03 DIAGNOSIS — E785 Hyperlipidemia, unspecified: Secondary | ICD-10-CM | POA: Diagnosis present

## 2020-04-03 DIAGNOSIS — D62 Acute posthemorrhagic anemia: Secondary | ICD-10-CM

## 2020-04-03 DIAGNOSIS — Z20822 Contact with and (suspected) exposure to covid-19: Secondary | ICD-10-CM | POA: Diagnosis not present

## 2020-04-03 DIAGNOSIS — E46 Unspecified protein-calorie malnutrition: Secondary | ICD-10-CM | POA: Diagnosis not present

## 2020-04-03 DIAGNOSIS — K219 Gastro-esophageal reflux disease without esophagitis: Secondary | ICD-10-CM | POA: Diagnosis present

## 2020-04-03 DIAGNOSIS — R7989 Other specified abnormal findings of blood chemistry: Secondary | ICD-10-CM

## 2020-04-03 DIAGNOSIS — K2971 Gastritis, unspecified, with bleeding: Secondary | ICD-10-CM | POA: Diagnosis present

## 2020-04-03 DIAGNOSIS — I69398 Other sequelae of cerebral infarction: Secondary | ICD-10-CM | POA: Diagnosis not present

## 2020-04-03 DIAGNOSIS — D72829 Elevated white blood cell count, unspecified: Secondary | ICD-10-CM | POA: Diagnosis present

## 2020-04-03 DIAGNOSIS — K250 Acute gastric ulcer with hemorrhage: Secondary | ICD-10-CM | POA: Diagnosis not present

## 2020-04-03 DIAGNOSIS — M2550 Pain in unspecified joint: Secondary | ICD-10-CM | POA: Diagnosis not present

## 2020-04-03 DIAGNOSIS — N183 Chronic kidney disease, stage 3 unspecified: Secondary | ICD-10-CM | POA: Diagnosis not present

## 2020-04-03 DIAGNOSIS — E860 Dehydration: Secondary | ICD-10-CM | POA: Diagnosis present

## 2020-04-03 DIAGNOSIS — R42 Dizziness and giddiness: Secondary | ICD-10-CM

## 2020-04-03 DIAGNOSIS — K2970 Gastritis, unspecified, without bleeding: Secondary | ICD-10-CM | POA: Diagnosis not present

## 2020-04-03 DIAGNOSIS — D631 Anemia in chronic kidney disease: Secondary | ICD-10-CM | POA: Diagnosis present

## 2020-04-03 DIAGNOSIS — I69328 Other speech and language deficits following cerebral infarction: Secondary | ICD-10-CM | POA: Diagnosis not present

## 2020-04-03 DIAGNOSIS — R7303 Prediabetes: Secondary | ICD-10-CM | POA: Diagnosis present

## 2020-04-03 DIAGNOSIS — R112 Nausea with vomiting, unspecified: Secondary | ICD-10-CM | POA: Diagnosis not present

## 2020-04-03 DIAGNOSIS — J9811 Atelectasis: Secondary | ICD-10-CM | POA: Diagnosis present

## 2020-04-03 DIAGNOSIS — R0902 Hypoxemia: Secondary | ICD-10-CM | POA: Diagnosis not present

## 2020-04-03 DIAGNOSIS — R7309 Other abnormal glucose: Secondary | ICD-10-CM | POA: Diagnosis not present

## 2020-04-03 DIAGNOSIS — Z7401 Bed confinement status: Secondary | ICD-10-CM | POA: Diagnosis not present

## 2020-04-03 DIAGNOSIS — R531 Weakness: Secondary | ICD-10-CM | POA: Diagnosis not present

## 2020-04-03 DIAGNOSIS — E876 Hypokalemia: Secondary | ICD-10-CM

## 2020-04-03 DIAGNOSIS — Z9842 Cataract extraction status, left eye: Secondary | ICD-10-CM

## 2020-04-03 DIAGNOSIS — N1831 Chronic kidney disease, stage 3a: Secondary | ICD-10-CM | POA: Diagnosis not present

## 2020-04-03 DIAGNOSIS — W19XXXA Unspecified fall, initial encounter: Secondary | ICD-10-CM | POA: Diagnosis not present

## 2020-04-03 DIAGNOSIS — Z888 Allergy status to other drugs, medicaments and biological substances status: Secondary | ICD-10-CM

## 2020-04-03 DIAGNOSIS — I69391 Dysphagia following cerebral infarction: Secondary | ICD-10-CM | POA: Diagnosis not present

## 2020-04-03 DIAGNOSIS — I872 Venous insufficiency (chronic) (peripheral): Secondary | ICD-10-CM | POA: Diagnosis present

## 2020-04-03 DIAGNOSIS — N179 Acute kidney failure, unspecified: Secondary | ICD-10-CM | POA: Diagnosis not present

## 2020-04-03 DIAGNOSIS — R1110 Vomiting, unspecified: Secondary | ICD-10-CM | POA: Diagnosis not present

## 2020-04-03 DIAGNOSIS — K920 Hematemesis: Secondary | ICD-10-CM | POA: Diagnosis not present

## 2020-04-03 DIAGNOSIS — R710 Precipitous drop in hematocrit: Secondary | ICD-10-CM | POA: Diagnosis not present

## 2020-04-03 DIAGNOSIS — Z8673 Personal history of transient ischemic attack (TIA), and cerebral infarction without residual deficits: Secondary | ICD-10-CM

## 2020-04-03 DIAGNOSIS — E66811 Obesity, class 1: Secondary | ICD-10-CM | POA: Diagnosis present

## 2020-04-03 DIAGNOSIS — K8020 Calculus of gallbladder without cholecystitis without obstruction: Secondary | ICD-10-CM | POA: Diagnosis not present

## 2020-04-03 DIAGNOSIS — R279 Unspecified lack of coordination: Secondary | ICD-10-CM | POA: Diagnosis not present

## 2020-04-03 DIAGNOSIS — K3189 Other diseases of stomach and duodenum: Secondary | ICD-10-CM | POA: Diagnosis present

## 2020-04-03 DIAGNOSIS — E669 Obesity, unspecified: Secondary | ICD-10-CM | POA: Diagnosis present

## 2020-04-03 DIAGNOSIS — I10 Essential (primary) hypertension: Secondary | ICD-10-CM | POA: Diagnosis not present

## 2020-04-03 DIAGNOSIS — R1013 Epigastric pain: Secondary | ICD-10-CM | POA: Diagnosis not present

## 2020-04-03 DIAGNOSIS — Z882 Allergy status to sulfonamides status: Secondary | ICD-10-CM | POA: Diagnosis not present

## 2020-04-03 DIAGNOSIS — K259 Gastric ulcer, unspecified as acute or chronic, without hemorrhage or perforation: Secondary | ICD-10-CM | POA: Diagnosis not present

## 2020-04-03 DIAGNOSIS — K828 Other specified diseases of gallbladder: Secondary | ICD-10-CM | POA: Diagnosis not present

## 2020-04-03 DIAGNOSIS — R0689 Other abnormalities of breathing: Secondary | ICD-10-CM | POA: Diagnosis not present

## 2020-04-03 DIAGNOSIS — I129 Hypertensive chronic kidney disease with stage 1 through stage 4 chronic kidney disease, or unspecified chronic kidney disease: Secondary | ICD-10-CM | POA: Diagnosis present

## 2020-04-03 DIAGNOSIS — K5730 Diverticulosis of large intestine without perforation or abscess without bleeding: Secondary | ICD-10-CM | POA: Diagnosis not present

## 2020-04-03 DIAGNOSIS — R000 Tachycardia, unspecified: Secondary | ICD-10-CM | POA: Diagnosis not present

## 2020-04-03 DIAGNOSIS — K589 Irritable bowel syndrome without diarrhea: Secondary | ICD-10-CM | POA: Diagnosis present

## 2020-04-03 DIAGNOSIS — M6281 Muscle weakness (generalized): Secondary | ICD-10-CM | POA: Diagnosis not present

## 2020-04-03 DIAGNOSIS — I951 Orthostatic hypotension: Secondary | ICD-10-CM | POA: Diagnosis not present

## 2020-04-03 DIAGNOSIS — I700 Atherosclerosis of aorta: Secondary | ICD-10-CM | POA: Diagnosis not present

## 2020-04-03 DIAGNOSIS — Z6839 Body mass index (BMI) 39.0-39.9, adult: Secondary | ICD-10-CM

## 2020-04-03 DIAGNOSIS — R41841 Cognitive communication deficit: Secondary | ICD-10-CM | POA: Diagnosis not present

## 2020-04-03 HISTORY — DX: Other complications of anesthesia, initial encounter: T88.59XA

## 2020-04-03 LAB — CBG MONITORING, ED: Glucose-Capillary: 157 mg/dL — ABNORMAL HIGH (ref 70–99)

## 2020-04-03 LAB — CBC
HCT: 31.8 % — ABNORMAL LOW (ref 36.0–46.0)
Hemoglobin: 10.2 g/dL — ABNORMAL LOW (ref 12.0–15.0)
MCH: 30.9 pg (ref 26.0–34.0)
MCHC: 32.1 g/dL (ref 30.0–36.0)
MCV: 96.4 fL (ref 80.0–100.0)
Platelets: 300 10*3/uL (ref 150–400)
RBC: 3.3 MIL/uL — ABNORMAL LOW (ref 3.87–5.11)
RDW: 13.2 % (ref 11.5–15.5)
WBC: 9.3 10*3/uL (ref 4.0–10.5)
nRBC: 0 % (ref 0.0–0.2)

## 2020-04-03 LAB — BASIC METABOLIC PANEL
Anion gap: 19 — ABNORMAL HIGH (ref 5–15)
BUN: 46 mg/dL — ABNORMAL HIGH (ref 8–23)
CO2: 21 mmol/L — ABNORMAL LOW (ref 22–32)
Calcium: 9.3 mg/dL (ref 8.9–10.3)
Chloride: 96 mmol/L — ABNORMAL LOW (ref 98–111)
Creatinine, Ser: 1.59 mg/dL — ABNORMAL HIGH (ref 0.44–1.00)
GFR calc Af Amer: 37 mL/min — ABNORMAL LOW (ref >60)
GFR calc non Af Amer: 32 mL/min — ABNORMAL LOW (ref >60)
Glucose, Bld: 155 mg/dL — ABNORMAL HIGH (ref 70–99)
Potassium: 3.2 mmol/L — ABNORMAL LOW (ref 3.5–5.1)
Sodium: 136 mmol/L (ref 135–145)

## 2020-04-03 LAB — HEPATIC FUNCTION PANEL
ALT: 23 U/L (ref 0–44)
AST: 58 U/L — ABNORMAL HIGH (ref 15–41)
Albumin: 3 g/dL — ABNORMAL LOW (ref 3.5–5.0)
Alkaline Phosphatase: 56 U/L (ref 38–126)
Bilirubin, Direct: 0.6 mg/dL — ABNORMAL HIGH (ref 0.0–0.2)
Indirect Bilirubin: 1.3 mg/dL — ABNORMAL HIGH (ref 0.3–0.9)
Total Bilirubin: 1.9 mg/dL — ABNORMAL HIGH (ref 0.3–1.2)
Total Protein: 8 g/dL (ref 6.5–8.1)

## 2020-04-03 LAB — SARS CORONAVIRUS 2 BY RT PCR (HOSPITAL ORDER, PERFORMED IN ~~LOC~~ HOSPITAL LAB): SARS Coronavirus 2: NEGATIVE

## 2020-04-03 LAB — LIPASE, BLOOD: Lipase: 39 U/L (ref 11–51)

## 2020-04-03 MED ORDER — AMLODIPINE BESYLATE 5 MG PO TABS
5.0000 mg | ORAL_TABLET | Freq: Every day | ORAL | Status: DC
  Administered 2020-04-04 – 2020-04-05 (×2): 5 mg via ORAL
  Filled 2020-04-03 (×2): qty 1

## 2020-04-03 MED ORDER — POTASSIUM CHLORIDE 10 MEQ/100ML IV SOLN
10.0000 meq | INTRAVENOUS | Status: AC
  Administered 2020-04-03 – 2020-04-04 (×4): 10 meq via INTRAVENOUS
  Filled 2020-04-03 (×4): qty 100

## 2020-04-03 MED ORDER — ONDANSETRON HCL 4 MG/2ML IJ SOLN
4.0000 mg | Freq: Once | INTRAMUSCULAR | Status: AC
  Administered 2020-04-03: 4 mg via INTRAVENOUS
  Filled 2020-04-03: qty 2

## 2020-04-03 MED ORDER — HEPARIN SODIUM (PORCINE) 5000 UNIT/ML IJ SOLN
5000.0000 [IU] | Freq: Three times a day (TID) | INTRAMUSCULAR | Status: DC
  Administered 2020-04-04 – 2020-04-05 (×5): 5000 [IU] via SUBCUTANEOUS
  Filled 2020-04-03 (×5): qty 1

## 2020-04-03 MED ORDER — ONDANSETRON HCL 4 MG/2ML IJ SOLN
4.0000 mg | Freq: Four times a day (QID) | INTRAMUSCULAR | Status: DC | PRN
  Administered 2020-04-05 (×2): 4 mg via INTRAVENOUS
  Filled 2020-04-03 (×2): qty 2

## 2020-04-03 MED ORDER — LACTATED RINGERS IV BOLUS
1000.0000 mL | Freq: Once | INTRAVENOUS | Status: AC
  Administered 2020-04-03: 1000 mL via INTRAVENOUS

## 2020-04-03 MED ORDER — SODIUM CHLORIDE 0.9% FLUSH
3.0000 mL | Freq: Once | INTRAVENOUS | Status: AC
  Administered 2020-04-07: 3 mL via INTRAVENOUS

## 2020-04-03 MED ORDER — SODIUM CHLORIDE 0.9 % IV BOLUS
1000.0000 mL | Freq: Once | INTRAVENOUS | Status: AC
  Administered 2020-04-03: 1000 mL via INTRAVENOUS

## 2020-04-03 MED ORDER — ONDANSETRON HCL 4 MG PO TABS
4.0000 mg | ORAL_TABLET | Freq: Four times a day (QID) | ORAL | Status: DC | PRN
  Administered 2020-04-04: 4 mg via ORAL
  Filled 2020-04-03: qty 1

## 2020-04-03 NOTE — ED Provider Notes (Signed)
[name] EMERGENCY DEPARTMENT Provider Note   CSN: [id number] Arrival date & time: 04/03/20  1152     History Chief Complaint  Patient presents with  . Weakness    Susan Fernandez is a 74 y.o. female with past medical history of GERD, CKD 3, hypertension, hyperlipidemia, morbid obesity, fall 1 year ago leading to chronic dizziness, posterior vitreous detachment, Previous thalamic infarct and chronic vertigo who presents with N/V and weakness.  Approximately 2 weeks of epigastric abdominal pain nausea vomiting.  She has had anorexia patient has been able to hold down fluids and her medications.  She is never had anything like this before.  The pain does not radiate.  It is worsened when she eats food.  She has no previous abdominal surgeries.  She denies urinary symptoms.  She has noticed that her urine has been dark color.  She has not had any black or tarry stools, diarrhea or constipation.  She denies chest pain or shortness of breath.  This morning the patient went to the bathroom using her walker around 3:00 in the morning.  She states that she sat on the toilet for a while she did not realize her legs had gone to sleep.  When she tried to get up she slipped to the ground.  She was unable to get herself up off the floor.  Her niece and brother checked on her around 7 AM and called EMS to help access her get her before.  Patient has been feeling quite dizzy when she stands.  She did not hit her head or lose consciousness today.  HPI     Past Medical History:  Diagnosis Date  . Cataract   . GERD (gastroesophageal reflux disease)   . Hyperlipidemia   . Hypertension 1995  . IBS (irritable bowel syndrome)   . Morbid obesity ([facility])   . 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 Posterior vitreous detachment 10/29/2015  . Prediabetes 2016  . Right thalamic infarction ([facility]) 11/03/2019   Lacunar R thalamic infarction without hemorrhage or shift per MRI 11/03/2019  .  Venous insufficiency     Patient Active Problem List   Diagnosis Date Noted  . Posterior left knee pain 11/04/2019  . Right thalamic infarction ([facility]) 11/03/2019  . Abnormal glucose 02/11/2019  . CKD (chronic kidney disease) stage 3, GFR 30-59 ml/min 06/21/2017  . Obesity (BMI 30.0-34.9) 06/20/2017  . HTN (hypertension) 11/03/2015  . Hyperlipidemia 11/03/2015  . Prediabetes 11/03/2015  . Vitamin D deficiency 11/03/2015  . Medication management 11/03/2015  . Combined form of senile cataract 10/29/2015    Past Surgical History:  Procedure Laterality Date  . CATARACT EXTRACTION, BILATERAL Bilateral 2018   L- 01/2017, R-02/2017  . RETINAL TEAR REPAIR CRYOTHERAPY Bilateral 2000   Dr. Cecilie Kicks     OB History   No obstetric history on file.     No family history on file.  Social History   Tobacco Use  . Smoking status: Never Smoker  . Smokeless tobacco: Never Used  Substance Use Topics  . Alcohol use: Not on file  . Drug use: Not on file    Home Medications Prior to Admission medications   Medication Sig Start Date End Date Taking? Authorizing Provider  amLODipine (NORVASC) 5 MG tablet Take 1 tablet Daily for BP 02/23/20   [name], MD  aspirin EC 81 MG EC tablet Take 1 tablet (81 mg total) by mouth daily. 11/05/19   Black, Lesle Chris, NP  atorvastatin (  LIPITOR) 80 MG tablet Take 1 tablet Daily for Cholesterol 02/23/20   [name], MD  fluticasone [facility]) 50 MCG/ACT nasal spray PLACE 2 SPRAYS INTO BOTH NOSTRILS DAILY AS NEEDED. 02/09/20   [name], MD    Allergies    Ppd [tuberculin purified protein derivative], Sulfa antibiotics, and Tolectin [tolmetin]  Review of Systems   Review of Systems Ten systems reviewed and are negative for acute change, except as noted in the HPI.   Physical Exam Updated Vital Signs BP 120/68   Pulse (!) 112   Temp 97.8 F (36.6 C) (Oral)   Resp 20   Ht 5\' 9"  (1.753 m)   Wt (!) 117.9 kg   SpO2 99%   BMI 38.40  kg/m   Physical Exam Vitals and nursing note reviewed.  Constitutional:      General: She is not in acute distress.    Appearance: She is well-developed. She is not diaphoretic.  HENT:     Head: Normocephalic and atraumatic.  Eyes:     General: No scleral icterus.    Conjunctiva/sclera: Conjunctivae normal.  Cardiovascular:     Rate and Rhythm: Normal rate and regular rhythm.     Heart sounds: Normal heart sounds. No murmur heard.  No friction rub. No gallop.   Pulmonary:     Effort: Pulmonary effort is normal. No respiratory distress.     Breath sounds: Normal breath sounds.  Abdominal:     General: Bowel sounds are normal.     Palpations: Abdomen is soft. There is no mass.     Tenderness: There is abdominal tenderness in the epigastric area. There is no guarding.     Hernia: No hernia is present.  Musculoskeletal:     Cervical back: Normal range of motion.  Skin:    General: Skin is warm and dry.  Neurological:     Mental Status: She is alert and oriented to person, place, and time.  Psychiatric:        Behavior: Behavior normal.     ED Results / Procedures / Treatments   Labs (all labs ordered are listed, but only abnormal results are displayed) Labs Reviewed  BASIC METABOLIC PANEL - Abnormal; Notable for the following components:      Result Value   Potassium 3.2 (*)    Chloride 96 (*)    CO2 21 (*)    Glucose, Bld 155 (*)    BUN 46 (*)    Creatinine, Ser 1.59 (*)    GFR calc non Af Amer 32 (*)    GFR calc Af Amer 37 (*)    Anion gap 19 (*)    All other components within normal limits  CBC - Abnormal; Notable for the following components:   RBC 3.30 (*)    Hemoglobin 10.2 (*)    HCT 31.8 (*)    All other components within normal limits  CBG MONITORING, ED - Abnormal; Notable for the following components:   Glucose-Capillary 157 (*)    All other components within normal limits  URINALYSIS, ROUTINE W REFLEX MICROSCOPIC  HEPATIC FUNCTION PANEL  LIPASE,  BLOOD    EKG None  Radiology No results found.  Procedures Procedures (including critical care time)  Medications Ordered in ED Medications  sodium chloride flush (NS) 0.9 % injection 3 mL (has no administration in time range)  sodium chloride 0.9 % bolus 1,000 mL (has no administration in time range)  ondansetron (ZOFRAN) injection 4 mg (has no administration in time  range)    ED Course  I have reviewed the triage vital signs and the nursing notes.  Pertinent labs & imaging results that were available during my care of the patient were reviewed by me and considered in my medical decision making (see chart for details).  Clinical Course as of Apr 04 1322  Fri Apr 03, 2020  2031 Nausea is improved. She is holding down foods. Patient is still orthostatic and will need more fluids    [AH]    Clinical Course User Index [AH] Arthor Captain, PA-C   MDM Rules/Calculators/A&P                         74 year old female who presents the emergency department with 2 weeks of nausea and vomiting which are persistent, epigastric abdominal pain, hypotension and orthostasis.  I reviewed the patient's labs which show elevated BUN of 44, creatinine which appears to be at baseline,With some chronic renal insufficiency CBC which is elevated white blood cell count, hepatic function shows elevated AST and total bilirubin, low albumin level.With some chronic renal insufficiency CBC which is elevated white blood cell count, hepatic function shows elevated AST and total bilirubin, low albumin level. Lipase is within normal limits Covid test and urine are pending.  Patient has received significant amount of fluid however continues to be orthostatic.  She her nausea is resolved and she is currently eating.  I ordered a CT scan of the abdomen and personally reviewed these images.  There is layering sludge in the gallbladder without overt stones.  Follow-up ultrasound which I personally reviewed is read as  a normal gallbladder by radiology however there is obscuration of the gallbladder by bowel gas.  I have high suspicion that this may be related to her gallbladder given her postprandial pain and persistent nausea vomiting.  I have given signout to Dr. Gardiner Rhyme who will assume care of the patient.   Final Clinical Impression(s) / ED Diagnoses Final diagnoses:  None    Rx / DC Orders ED Discharge Orders    None       Arthor Captain, PA-C 04/04/20 1328    Mancel Bale, MD 04/05/20 [phone]

## 2020-04-03 NOTE — ED Triage Notes (Signed)
Pt bib ems from home with n/v, weakness, and decreased po intake X2 weeks. Pt fell this morning at 0300 due to the weakness and was unable to get herself up. Denies blood thinners. + orthostatic changes with EMS. 136/72 to 109/60 with postural change HR 110 20 RR CBG 174 96.40F

## 2020-04-03 NOTE — ED Notes (Signed)
PT could not complete orthostatic vital signs, As PT went to stand HR spiked and stated she was getting dizzy.

## 2020-04-03 NOTE — ED Notes (Signed)
Patient transported to CT 

## 2020-04-03 NOTE — ED Notes (Signed)
Pt provided with food and drink for PO challenge.

## 2020-04-03 NOTE — ED Provider Notes (Signed)
  Face-to-face evaluation   History: She presents for evaluation of weakness, which occurred when she was sitting on commode.  She also felt hot at that time.  When she tried to stand up her legs are numb and she was unable to walk.  A family member helped her get up.  She has been ill for 2 weeks with vomiting, a couple of times a week.  She is able to tolerate her medications usually.  Physical exam: Alert elderly female.  Lucid, no dysarthria.  Abdomen soft and nontender to palpation.  Heart tachycardic without murmur.  Lungs clear to auscultation.  Medical screening examination/treatment/procedure(s) were conducted as a shared visit with non-physician practitioner(s) and myself.  I personally evaluated the patient during the encounter    Mancel Bale, MD 04/05/20 [phone]

## 2020-04-03 NOTE — ED Provider Notes (Signed)
Patient CARE signed out with persistent orthostatic symptoms, clinically dehydrated, BUN significantly elevated.  IV fluid bolus given however patient still symptomatic.  Imaging concerning for biliary sludge and mild elevated liver function testing reviewed blood work.  With persistent symptoms for over a week, multiple abnormal findings discussed with hospitalist for observation/admission.  The patients results and plan were reviewed and discussed.   Any x-rays performed were independently reviewed by myself.   Differential diagnosis were considered with the presenting HPI.  Medications  sodium chloride flush (NS) 0.9 % injection 3 mL (3 mLs Intravenous Not Given 04/03/20 1618)  sodium chloride 0.9 % bolus 1,000 mL (0 mLs Intravenous Stopped 04/03/20 1633)  ondansetron (ZOFRAN) injection 4 mg (4 mg Intravenous Given 04/03/20 1357)  lactated ringers bolus 1,000 mL (1,000 mLs Intravenous New Bag/Given 04/03/20 2119)    Vitals:   04/03/20 1932 04/03/20 2015 04/03/20 2115 04/03/20 2215  BP: (!) 115/96 (!) 142/109 (!) 135/61 (!) 132/66  Pulse:  84 89 86  Resp:  18 15 16   Temp:      TempSrc:      SpO2:  100% 98% 98%  Weight:      Height:        Final diagnoses:  Epigastric abdominal pain  Orthostatic dizziness  Hypokalemia  Elevated LFTs  Acute renal failure, unspecified acute renal failure type [facility])    Admission/ observation were discussed with the admitting physician, patient and/or family and they are comfortable with the plan.     [facility], MD 04/03/20 2226

## 2020-04-04 ENCOUNTER — Inpatient Hospital Stay (HOSPITAL_COMMUNITY): Payer: Medicare PPO

## 2020-04-04 DIAGNOSIS — E782 Mixed hyperlipidemia: Secondary | ICD-10-CM

## 2020-04-04 DIAGNOSIS — E669 Obesity, unspecified: Secondary | ICD-10-CM

## 2020-04-04 DIAGNOSIS — I10 Essential (primary) hypertension: Secondary | ICD-10-CM

## 2020-04-04 LAB — COMPREHENSIVE METABOLIC PANEL
ALT: 20 U/L (ref 0–44)
AST: 43 U/L — ABNORMAL HIGH (ref 15–41)
Albumin: 2.5 g/dL — ABNORMAL LOW (ref 3.5–5.0)
Alkaline Phosphatase: 47 U/L (ref 38–126)
Anion gap: 10 (ref 5–15)
BUN: 44 mg/dL — ABNORMAL HIGH (ref 8–23)
CO2: 25 mmol/L (ref 22–32)
Calcium: 8.6 mg/dL — ABNORMAL LOW (ref 8.9–10.3)
Chloride: 101 mmol/L (ref 98–111)
Creatinine, Ser: 1.29 mg/dL — ABNORMAL HIGH (ref 0.44–1.00)
GFR calc Af Amer: 48 mL/min — ABNORMAL LOW (ref >60)
GFR calc non Af Amer: 41 mL/min — ABNORMAL LOW (ref >60)
Glucose, Bld: 108 mg/dL — ABNORMAL HIGH (ref 70–99)
Potassium: 4 mmol/L (ref 3.5–5.1)
Sodium: 136 mmol/L (ref 135–145)
Total Bilirubin: 0.6 mg/dL (ref 0.3–1.2)
Total Protein: 6.3 g/dL — ABNORMAL LOW (ref 6.5–8.1)

## 2020-04-04 LAB — CREATININE, SERUM
Creatinine, Ser: 1.43 mg/dL — ABNORMAL HIGH (ref 0.44–1.00)
GFR calc Af Amer: 42 mL/min — ABNORMAL LOW (ref >60)
GFR calc non Af Amer: 36 mL/min — ABNORMAL LOW (ref >60)

## 2020-04-04 LAB — URINALYSIS, ROUTINE W REFLEX MICROSCOPIC
Bilirubin Urine: NEGATIVE
Glucose, UA: NEGATIVE mg/dL
Ketones, ur: 5 mg/dL — AB
Nitrite: NEGATIVE
Protein, ur: 30 mg/dL — AB
Specific Gravity, Urine: 1.027 (ref 1.005–1.030)
WBC, UA: >50 WBC/hpf — ABNORMAL HIGH (ref 0–5)
pH: 6 (ref 5.0–8.0)

## 2020-04-04 LAB — CBC
HCT: 26.2 % — ABNORMAL LOW (ref 36.0–46.0)
HCT: 26.6 % — ABNORMAL LOW (ref 36.0–46.0)
Hemoglobin: 8.6 g/dL — ABNORMAL LOW (ref 12.0–15.0)
Hemoglobin: 8.6 g/dL — ABNORMAL LOW (ref 12.0–15.0)
MCH: 30.7 pg (ref 26.0–34.0)
MCH: 31.3 pg (ref 26.0–34.0)
MCHC: 32.3 g/dL (ref 30.0–36.0)
MCHC: 32.8 g/dL (ref 30.0–36.0)
MCV: 95 fL (ref 80.0–100.0)
MCV: 95.3 fL (ref 80.0–100.0)
Platelets: 245 10*3/uL (ref 150–400)
Platelets: 255 10*3/uL (ref 150–400)
RBC: 2.75 MIL/uL — ABNORMAL LOW (ref 3.87–5.11)
RBC: 2.8 MIL/uL — ABNORMAL LOW (ref 3.87–5.11)
RDW: 13.3 % (ref 11.5–15.5)
RDW: 13.5 % (ref 11.5–15.5)
WBC: 11.3 10*3/uL — ABNORMAL HIGH (ref 4.0–10.5)
WBC: 13.3 10*3/uL — ABNORMAL HIGH (ref 4.0–10.5)
nRBC: 0 % (ref 0.0–0.2)
nRBC: 0 % (ref 0.0–0.2)

## 2020-04-04 MED ORDER — HYDROCODONE-ACETAMINOPHEN 5-325 MG PO TABS: 1.0000 | ORAL_TABLET | Freq: Three times a day (TID) | ORAL | Status: DC | PRN

## 2020-04-04 NOTE — Progress Notes (Signed)
PROGRESS NOTE  Sherrian DiversVictoria Fernandez ZOX:[id number] DOB: June 06, 1946 DOA: 04/03/2020 PCP: Lucky CowboyMcKeown, William, MD  HPI/Recap of past 24 hours: HPI from Dr Gerri LinsSwayze 74 yr old woman who presentes with complaints of weakness, nausea and vomiting, that has been ongoing for 2 weeks. Patient carries a past medical history significant for pre-diabetes, venous insufficiency, GERD, HLD, HTN, morbid obesity, history of right thalamic infarction, and CKD III. In the ED she was found to have orthostasis. Otherwise, RR is normal, oxygen saturations, and heart rate is normal. Labwork on presentation demonstrated low potassium at 3.2, creatinine of 1.59, elevated T bili at 1.9, elevated WBC at 13.3 and hemoglobin of 8.6. CT abdomen demonstrated a gallbladder full of sludge/stones. RUQ ultrasound was normal. [facility] were consulted to admit the patient for further evaluation and care.     Today, patient still with nausea, some generalized abdominal discomfort and generalized weakness. Denies any further vomiting, diarrhea, chest pain, shortness of breath, fever/chills.   Assessment/Plan: Principal Problem:   Nausea and vomiting Active Problems:   HTN (hypertension)   Hyperlipidemia   Obesity (BMI 30.0-34.9)   CKD (chronic kidney disease) stage 3, GFR 30-59 ml/min   Intractable nausea/vomiting Ongoing, possible gastroenteritis  LFTs WNL except for T bili 1.9 on admission, currently T bili WNL Lipase WNL CT abdomen/pelvis showed gallbladder with mixture of biliary stones and sludge without features of acute cholecystitis Right upper quadrant ultrasound was normal, showed no gallstones or wall thickening HIDA scan pending (materials needed will not be available until Monday) Supportive care, antiemetics Clear liquid diet for now, advance as tolerated Monitor closely  AKI on CKD stage IIIa Improving Creatinine baseline around 1.3, on admission 1.59 Likely 2/2 above S/p IV fluids Daily  BMP  Orthostatic hypotension Likely 2/2 poor oral intake S/p IV fluids Daily orthostatic vitals  Leukocytosis Possibly ??reactive UA showed large leukocytes, rare bacteria, greater than 50 WBC [facility] Chest x-ray pending Daily CBC  ?Anemia of chronic kidney disease Hemoglobin baseline around 12, on admission 10.2-->8.6 Denies any bleeding FOBT pending Anemia panel pending Daily CBC  Hypertension Continue home Norvasc for now, as BP is uncontrolled  Obesity Lifestyle modification advised       Malnutrition Type:      Malnutrition Characteristics:      Nutrition Interventions:       Estimated body mass index is 39.43 kg/m as calculated from the following:   Height as of this encounter: 5\' 9"  (1.753 m).   Weight as of this encounter: 121.1 kg.     Code Status: Partial code  Family Communication: Discussed extensively with patient  Disposition Plan: Status is: Inpatient  Remains inpatient appropriate because:Inpatient level of care appropriate due to severity of illness   Dispo: The patient is from: Home              Anticipated d/c is to: Home              Anticipated d/c date is: 2 days              Patient currently is not medically stable to d/c.    Consultants:  None  Procedures:  None  Antimicrobials:  None  DVT prophylaxis: Heparin Lynch   Objective: Vitals:   04/04/20 0918 04/04/20 0958 04/04/20 1158 04/04/20 1302  BP: (!) 165/75 (!) 160/67 122/69 (!) 132/57  Pulse: 91 86 94 85  Resp: 18 18    Temp:      TempSrc:  SpO2: 100% 99% 100% 97%  Weight:      Height:        Intake/Output Summary (Last 24 hours) at 04/04/2020 1353 Last data filed at 04/04/2020 0430 Gross per 24 hour  Intake 2400 ml  Output --  Net 2400 ml   Filed Weights   04/03/20 1155 04/04/20 0740  Weight: (!) 117.9 kg (!) 121.1 kg    Exam:  General: NAD, deconditioned, chronically  ill-appearing  Cardiovascular: S1, S2 present  Respiratory: CTAB  Abdomen: Soft, generalized tenderness, nondistended, bowel sounds present  Musculoskeletal: No bilateral pedal edema noted  Skin: Normal  Psychiatry: Normal mood   Data Reviewed: CBC: Recent Labs  Lab 04/03/20 1207 04/03/20 2354 04/04/20 0442  WBC 9.3 13.3* 11.3*  HGB 10.2* 8.6* 8.6*  HCT 31.8* 26.6* 26.2*  MCV 96.4 95.0 95.3  PLT 300 245 255   Basic Metabolic Panel: Recent Labs  Lab 04/03/20 1207 04/03/20 2354 04/04/20 0442  NA 136  --  136  K 3.2*  --  4.0  CL 96*  --  101  CO2 21*  --  25  GLUCOSE 155*  --  108*  BUN 46*  --  44*  CREATININE 1.59* 1.43* 1.29*  CALCIUM 9.3  --  8.6*   GFR: Estimated Creatinine Clearance: 54.9 mL/min (A) (by C-G formula based on SCr of 1.29 mg/dL (H)). Liver Function Tests: Recent Labs  Lab 04/03/20 1355 04/04/20 0442  AST 58* 43*  ALT 23 20  ALKPHOS 56 47  BILITOT 1.9* 0.6  PROT 8.0 6.3*  ALBUMIN 3.0* 2.5*   Recent Labs  Lab 04/03/20 1355  LIPASE 39   No results for input(s): AMMONIA in the last 168 hours. Coagulation Profile: No results for input(s): INR, PROTIME in the last 168 hours. Cardiac Enzymes: No results for input(s): CKTOTAL, CKMB, CKMBINDEX, TROPONINI in the last 168 hours. BNP (last 3 results) No results for input(s): PROBNP in the last 8760 hours. HbA1C: No results for input(s): HGBA1C in the last 72 hours. CBG: Recent Labs  Lab 04/03/20 1257  GLUCAP 157*   Lipid Profile: No results for input(s): CHOL, HDL, LDLCALC, TRIG, CHOLHDL, LDLDIRECT in the last 72 hours. Thyroid Function Tests: No results for input(s): TSH, T4TOTAL, FREET4, T3FREE, THYROIDAB in the last 72 hours. Anemia Panel: No results for input(s): VITAMINB12, FOLATE, FERRITIN, TIBC, IRON, RETICCTPCT in the last 72 hours. Urine analysis:    Component Value Date/Time   COLORURINE YELLOW 04/04/2020 0807   APPEARANCEUR CLOUDY (A) 04/04/2020 0807   LABSPEC  1.027 04/04/2020 0807   PHURINE 6.0 04/04/2020 0807   GLUCOSEU NEGATIVE 04/04/2020 0807   HGBUR SMALL (A) 04/04/2020 0807   BILIRUBINUR NEGATIVE 04/04/2020 0807   KETONESUR 5 (A) 04/04/2020 0807   PROTEINUR 30 (A) 04/04/2020 0807   NITRITE NEGATIVE 04/04/2020 0807   LEUKOCYTESUR LARGE (A) 04/04/2020 0807   Sepsis Labs: @LABRCNTIP (procalcitonin:4,lacticidven:4)  ) Recent Results (from the past 240 hour(s))  SARS Coronavirus 2 by RT PCR (hospital order, performed in [facility] hospital lab) Nasopharyngeal Nasopharyngeal Swab     Status: None   Collection Time: 04/03/20 10:29 PM   Specimen: Nasopharyngeal Swab  Result Value Ref Range Status   SARS Coronavirus 2 NEGATIVE NEGATIVE Final    Comment: (NOTE) SARS-CoV-2 target nucleic acids are NOT DETECTED.  The SARS-CoV-2 RNA is generally detectable in upper and lower respiratory specimens during the acute phase of infection. The lowest concentration of SARS-CoV-2 viral copies this assay can detect is 250 copies /  mL. A negative result does not preclude SARS-CoV-2 infection and should not be used as the sole basis for treatment or other patient management decisions.  A negative result may occur with improper specimen collection / handling, submission of specimen other than nasopharyngeal swab, presence of viral mutation(s) within the areas targeted by this assay, and inadequate number of viral copies (<250 copies / mL). A negative result must be combined with clinical observations, patient history, and epidemiological information.  Fact Sheet for Patients:   BoilerBrush.com.cy  Fact Sheet for Healthcare Providers: https://pope.com/  This test is not yet approved or  cleared by the [place] FDA and has been authorized for detection and/or diagnosis of SARS-CoV-2 by FDA under an Emergency Use Authorization (EUA).  This EUA will remain in effect (meaning this test can be used) for  the duration of the COVID-19 declaration under Section 564(b)(1) of the Act, 21 U.S.C. section 360bbb-3(b)(1), unless the authorization is terminated or revoked sooner.  Performed at [facility], [address], [place], [place] [address]       Studies: CT ABDOMEN PELVIS WO CONTRAST  Result Date: 04/03/2020 CLINICAL DATA:  Nausea, vomiting EXAM: CT ABDOMEN AND PELVIS WITHOUT CONTRAST TECHNIQUE: Multidetector CT imaging of the abdomen and pelvis was performed following the standard protocol without IV contrast. COMPARISON:  None FINDINGS: Lower chest: Bandlike atelectasis in the right lung base with elevation of the right hemidiaphragm. Lung bases otherwise clear. Normal heart size. No pericardial effusion. Coronary artery and aortic leaflet calcifications are present. Hepatobiliary: Subcentimeter hyper in focus at the dome of the liver too small to fully characterize on CT imaging but statistically likely benign (3/8). No visible concerning liver lesion. Smooth surface contour. Normal attenuation. Dependently layering hyperdense material within the gallbladder could reflect a mixture of stones and sludge. No pericholecystic fluid or inflammation. No ductal dilatation or visible intraductal gallstones. Pancreas: Unremarkable. No pancreatic ductal dilatation or surrounding inflammatory changes. Spleen: Normal in size. No concerning splenic lesions. Adrenals/Urinary Tract: Normal adrenal glands. Kidneys are normally located and symmetric in size without visible concerning renal lesion. Urolithiasis or hydronephrosis. Urinary bladder is unremarkable. Stomach/Bowel: Distal esophagus, stomach and duodenal sweep are unremarkable. No small bowel wall thickening or dilatation. No evidence of obstruction. A normal appendix is visualized. No colonic dilatation or wall thickening. Scattered colonic diverticula without focal inflammation to suggest diverticulitis. Vascular/Lymphatic: Atherosclerotic  calcifications within the abdominal aorta and branch vessels. No aneurysm or ectasia. No enlarged abdominopelvic lymph nodes. Reproductive: Mildly retroverted uterus. No concerning adnexal lesions. Other: No abdominopelvic free fluid or free gas. No bowel containing hernias. Musculoskeletal: Mild rightward curvature of the thoracolumbar spine with a degree of pelvic tilt. No acute or worrisome osseous abnormalities. Multilevel flowing anterior osteophytosis in the lower thoracic spine, compatible with features of diffuse idiopathic skeletal hyperostosis (DISH). Multilevel degenerative changes are present in the imaged portions of the spine. Additional degenerative changes in the hips and pelvis. IMPRESSION: 1. Hyperdense material within the gallbladder, could reflect a mixture of biliary stones and sludge without features of acute cholecystitis. If there is further concern, right upper quadrant ultrasound could be obtained. 2. Colonic diverticulosis without evidence of diverticulitis. 3. No other acute or suspicious abnormality to provide cause for patient's symptoms. 4. Aortic Atherosclerosis (ICD10-I70.0). Electronically Signed   By: Kreg Shropshire M.D.   On: 04/03/2020 15:06   US ABDOMEN LIMITED RUQ  Result Date: 04/03/2020 CLINICAL DATA:  Epigastric abdominal pain, nausea, vomiting EXAM: ULTRASOUND ABDOMEN LIMITED RIGHT UPPER  QUADRANT COMPARISON:  CT 2:43 p.m. FINDINGS: Gallbladder: No gallstones or wall thickening visualized. No sonographic Murphy sign noted by sonographer. Common bile duct: Diameter: 4 mm in proximal diameter. The distal duct is obscured by overlying bowel gas. Liver: No focal lesion identified. Within normal limits in parenchymal echogenicity. Portal vein is patent on color Doppler imaging with normal direction of blood flow towards the liver. Other: No ascites IMPRESSION: Normal right upper quadrant sonogram. Electronically Signed   By: Helyn Numbers MD   On: 04/03/2020 18:51     Scheduled Meds: . amLODipine  5 mg Oral Daily  . heparin  5,000 Units Subcutaneous Q8H  . sodium chloride flush  3 mL Intravenous Once    Continuous Infusions:   LOS: 1 day     Briant Cedar, MD [facility]  If 7PM-7AM, please contact night-coverage [email or web address] 04/04/2020, 1:53 PM

## 2020-04-04 NOTE — Plan of Care (Signed)

## 2020-04-04 NOTE — H&P (Signed)
Susan Fernandez is an 74 y.o. female.   Chief Complaint: Orthostatic hypotension, intractable nausea and vomiting. HPI: The patient is a 74 yr old woman who presentes with complaints of weakness, nausea and vomiting. She states that this has been ongoing for 2 weeks.  The patient carries a past medical history significant for pre-diabetes, Venous insufficiency, GERD, Hyperlipidemia, Hypertension, morbid obesity, history of right thalamic infarction, and CKD III.  In the ED she was found to have orthostasis. Otherwise, RR is normal, oxygen saturations, and heart rate is normal.   Labwork on presentation demonstrated low potassium at 3.2, Creatinine of 1.59, Elevated T bili at 1.9. elevated WBC at 13.3 and hemoglobin of 8.6.   CT abdomen demonstrated a gallbladder full of sludge/stones. RUQ ultrasound was normal.  The patient denies hematemesis, melena, hematochezia, and coffee ground emesis. She denies fevers or chills. No cough, shortness of breath, chest pain. No constipation or diarrhea. No abdominal pain. No edema of extremities.  [facility] were consulted to admit the patient for further evaluation and care. Past Medical History:  Diagnosis Date  . Cataract   . GERD (gastroesophageal reflux disease)   . Hyperlipidemia   . Hypertension 1995  . IBS (irritable bowel syndrome)   . Morbid obesity (HCC)   . 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 Posterior vitreous detachment 10/29/2015  . Prediabetes 2016  . Right thalamic infarction (HCC) 11/03/2019   Lacunar R thalamic infarction without hemorrhage or shift per MRI 11/03/2019  . Venous insufficiency     Past Surgical History:  Procedure Laterality Date  . CATARACT EXTRACTION, BILATERAL Bilateral 2018   L- 01/2017, R-02/2017  . RETINAL TEAR REPAIR CRYOTHERAPY Bilateral 2000   Dr. Cecilie Kicks    No family history on file. Social History:  reports that she has never smoked. She has never used smokeless tobacco. No history on  file for alcohol use and drug use. (Not in a hospital admission)   Allergies:  Allergies  Allergen Reactions  . Ppd [Tuberculin Purified Protein Derivative]   . Sulfa Antibiotics Other (See Comments)    Passed out  . Tolectin [Tolmetin]     Pertinent items noted in HPI and remainder of comprehensive ROS otherwise negative.   General appearance: alert, cooperative and no distress Head: Normocephalic, without obvious abnormality, atraumatic Eyes: conjunctivae/corneas clear. PERRL, EOM's intact. Fundi benign. Throat: lips, mucosa, and tongue normal; teeth and gums normal Neck: no adenopathy, no carotid bruit, no JVD, supple, symmetrical, trachea midline and thyroid not enlarged, symmetric, no tenderness/mass/nodules Resp: No increased work of breathing. No wheezes, rales, or rhonchi. No tactile fremitus. Chest wall: no tenderness Cardio: regular rate and rhythm, S1, S2 normal, no murmur, click, rub or gallop GI: soft, non-tender; bowel sounds normal; no masses,  no organomegaly Extremities: extremities normal, atraumatic, no cyanosis or edema Pulses: 2+ and symmetric Skin: Skin color, texture, turgor normal. No rashes or lesions Lymph nodes: Cervical, supraclavicular, and axillary nodes normal. Neurologic: Alert and oriented X 3, normal strength and tone. Normal symmetric reflexes. Normal coordination and gait   Results for orders placed or performed during the hospital encounter of 04/03/20 (from the past 48 hour(s))  Basic metabolic panel     Status: Abnormal   Collection Time: 04/03/20 12:07 PM  Result Value Ref Range   Sodium 136 135 - 145 mmol/L   Potassium 3.2 (L) 3.5 - 5.1 mmol/L   Chloride 96 (L) 98 - 111 mmol/L   CO2 21 (L) 22 - 32 mmol/L  Glucose, Bld 155 (H) 70 - 99 mg/dL    Comment: Glucose reference range applies only to samples taken after fasting for at least 8 hours.   BUN 46 (H) 8 - 23 mg/dL   Creatinine, Ser 1.321.59 (H) 0.44 - 1.00 mg/dL   Calcium 9.3 8.9 -  44.010.3 mg/dL   GFR calc non Af Amer 32 (L) >60 mL/min   GFR calc Af Amer 37 (L) >60 mL/min   Anion gap 19 (H) 5 - 15    Comment: Performed at [facility], [address] [place], [place] [address]  CBC     Status: Abnormal   Collection Time: 04/03/20 12:07 PM  Result Value Ref Range   WBC 9.3 4.0 - 10.5 K/uL   RBC 3.30 (L) 3.87 - 5.11 MIL/uL   Hemoglobin 10.2 (L) 12.0 - 15.0 g/dL   HCT 53.631.8 (L) 36 - 46 %   MCV 96.4 80.0 - 100.0 fL   MCH 30.9 26.0 - 34.0 pg   MCHC 32.1 30.0 - 36.0 g/dL   RDW 64.413.2 03.411.5 - 74.215.5 %   Platelets 300 150 - 400 K/uL   nRBC 0.0 0.0 - 0.2 %    Comment: Performed at [facility], [address] [place], [place] [address]  CBG monitoring, ED     Status: Abnormal   Collection Time: 04/03/20 12:57 PM  Result Value Ref Range   Glucose-Capillary 157 (H) 70 - 99 mg/dL    Comment: Glucose reference range applies only to samples taken after fasting for at least 8 hours.  Hepatic function panel     Status: Abnormal   Collection Time: 04/03/20  1:55 PM  Result Value Ref Range   Total Protein 8.0 6.5 - 8.1 g/dL   Albumin 3.0 (L) 3.5 - 5.0 g/dL   AST 58 (H) 15 - 41 U/L   ALT 23 0 - 44 U/L   Alkaline Phosphatase 56 38 - 126 U/L   Total Bilirubin 1.9 (H) 0.3 - 1.2 mg/dL   Bilirubin, Direct 0.6 (H) 0.0 - 0.2 mg/dL   Indirect Bilirubin 1.3 (H) 0.3 - 0.9 mg/dL    Comment: Performed at [facility], [address] [place], [place] [address]  Lipase, blood     Status: None   Collection Time: 04/03/20  1:55 PM  Result Value Ref Range   Lipase 39 11 - 51 U/L    Comment: Performed at [facility], [address] [place], [place] [address]  SARS Coronavirus 2 by RT PCR (hospital order, performed in [facility] hospital lab) Nasopharyngeal Nasopharyngeal Swab     Status: None   Collection Time: 04/03/20 10:29 PM   Specimen: Nasopharyngeal Swab  Result Value Ref Range   SARS Coronavirus 2 NEGATIVE NEGATIVE    Comment: (NOTE) SARS-CoV-2 target  nucleic acids are NOT DETECTED.  The SARS-CoV-2 RNA is generally detectable in upper and lower respiratory specimens during the acute phase of infection. The lowest concentration of SARS-CoV-2 viral copies this assay can detect is 250 copies / mL. A negative result does not preclude SARS-CoV-2 infection and should not be used as the sole basis for treatment or other patient management decisions.  A negative result may occur with improper specimen collection / handling, submission of specimen other than nasopharyngeal swab, presence of viral mutation(s) within the areas targeted by this assay, and inadequate number of viral copies (<250 copies / mL). A negative result must be combined with  clinical observations, patient history, and epidemiological information.  Fact Sheet for Patients:   BoilerBrush.com.cy  Fact Sheet for Healthcare Providers: https://pope.com/  This test is not yet approved or  cleared by the [place] FDA and has been authorized for detection and/or diagnosis of SARS-CoV-2 by FDA under an Emergency Use Authorization (EUA).  This EUA will remain in effect (meaning this test can be used) for the duration of the COVID-19 declaration under Section 564(b)(1) of the Act, 21 U.S.C. section 360bbb-3(b)(1), unless the authorization is terminated or revoked sooner.  Performed at [facility], [address], [place], [place] [address]   CBC     Status: Abnormal   Collection Time: 04/03/20 11:54 PM  Result Value Ref Range   WBC 13.3 (H) 4.0 - 10.5 K/uL   RBC 2.80 (L) 3.87 - 5.11 MIL/uL   Hemoglobin 8.6 (L) 12.0 - 15.0 g/dL   HCT 29.5 (L) 36 - 46 %   MCV 95.0 80.0 - 100.0 fL   MCH 30.7 26.0 - 34.0 pg   MCHC 32.3 30.0 - 36.0 g/dL   RDW 28.4 13.2 - 44.0 %   Platelets 245 150 - 400 K/uL   nRBC 0.0 0.0 - 0.2 %    Comment: Performed at [facility], [address] [place], [place] [address]  Creatinine,  serum     Status: Abnormal   Collection Time: 04/03/20 11:54 PM  Result Value Ref Range   Creatinine, Ser 1.43 (H) 0.44 - 1.00 mg/dL   GFR calc non Af Amer 36 (L) >60 mL/min   GFR calc Af Amer 42 (L) >60 mL/min    Comment: Performed at [facility], [address], [place], [place] [address]   [email or web address] @  Blood pressure (!) 141/63, pulse 93, temperature 97.8 F (36.6 C), temperature source Oral, resp. rate 16, height 5\' 9"  (1.753 m), weight (!) 117.9 kg, SpO2 100 %.    Assessment/Plan Problem  Nausea and Vomiting  Ckd (Chronic Kidney Disease) Stage 3, Gfr 30-59 Ml/Min  Obesity (Bmi 30.0-34.9)  Htn (Hypertension)  Hyperlipidemia   Intractable nausea and vomiting: DDx: Acalculous cholecystitis; gastritis; viral gastroenteritis. Will check a HIDA scan in the am. Continue PPI. Check enteric pathogen panel. She is on a clear liquid diet. Antiemetics are available on an as needed basis.  AKI on CKD III: Due to poor PO intake and volume losses due to vomiting. Will give cautious IV fluids. Monitor creatinine, electrolytes, and volume status.  Orthostatic hypotension: Cautious IV fluids. Hold antihypertensives.  Hyperlipidemia: Statin held due to elevated creatinine.  Obesity: Complicates all cares. Recommend program of sensible diet and excersise.  I have seen and examined this patient myself. I have spent 76 minutes in her evaluation and care.  DVT prophylaxis: Heparin CODE STATUS: Partial Family Communication: None available Disposition: The patient is admitted from home. She will be admitted to a progressive bed with telemetry as an inpatient. I anticipate discharge to home.  Severity of Illness: The appropriate patient status for this patient is INPATIENT. Inpatient status is judged to be reasonable and necessary in order to provide the required intensity of service to ensure the patient's safety. The patient's presenting symptoms, physical exam findings, and initial  radiographic and laboratory data in the context of their chronic comorbidities is felt to place them at high risk for further clinical deterioration. Furthermore, it is not anticipated that the patient will be medically stable for discharge from the hospital within 2 midnights  of admission. The following factors support the patient status of inpatient.   " The patient's presenting symptoms include nausea/vomiting, weakness. " The worrisome physical exam findings include Dry mucous membranes, orthostasis. " The initial radiographic and laboratory data are worrisome because of elevated bilirubin, elevated creatinine, and sludge/stones in gallbladder.. " The chronic co-morbidities include Morbid obesity, hyperlipidemia, CKD III.   * I certify that at the point of admission it is my clinical judgment that the patient will require inpatient hospital care spanning beyond 2 midnights from the point of admission due to high intensity of service, high risk for further deterioration and high frequency of surveillance required.*    Rica Heather 04/04/2020, 2:12 AM

## 2020-04-04 NOTE — ED Notes (Signed)
Son at bedside.

## 2020-04-04 NOTE — ED Notes (Signed)
Attempted blood drawl with no success.

## 2020-04-04 NOTE — ED Notes (Signed)
Lunch Tray Ordered @ 1044.  

## 2020-04-04 NOTE — ED Notes (Signed)
Breakfast ordered by Loriene Taunton 

## 2020-04-05 ENCOUNTER — Encounter (HOSPITAL_COMMUNITY): Payer: Self-pay | Admitting: Internal Medicine

## 2020-04-05 ENCOUNTER — Inpatient Hospital Stay (HOSPITAL_COMMUNITY): Payer: Medicare PPO | Admitting: Certified Registered Nurse Anesthetist

## 2020-04-05 ENCOUNTER — Encounter (HOSPITAL_COMMUNITY)
Admission: EM | Disposition: A | Discharge status: Skilled Nursing Facility | Payer: Self-pay | Source: Home / Self Care | Attending: Internal Medicine

## 2020-04-05 DIAGNOSIS — D62 Acute posthemorrhagic anemia: Secondary | ICD-10-CM | POA: Diagnosis not present

## 2020-04-05 DIAGNOSIS — K8020 Calculus of gallbladder without cholecystitis without obstruction: Secondary | ICD-10-CM

## 2020-04-05 DIAGNOSIS — K254 Chronic or unspecified gastric ulcer with hemorrhage: Secondary | ICD-10-CM | POA: Diagnosis not present

## 2020-04-05 DIAGNOSIS — K250 Acute gastric ulcer with hemorrhage: Secondary | ICD-10-CM

## 2020-04-05 DIAGNOSIS — K920 Hematemesis: Secondary | ICD-10-CM | POA: Diagnosis not present

## 2020-04-05 HISTORY — PX: ESOPHAGOGASTRODUODENOSCOPY (EGD) WITH PROPOFOL: SHX5813

## 2020-04-05 HISTORY — PX: HEMOSTASIS CLIP PLACEMENT: SHX6857

## 2020-04-05 HISTORY — PX: SUBMUCOSAL INJECTION: SHX5543

## 2020-04-05 HISTORY — PX: HEMOSTASIS CONTROL: SHX6838

## 2020-04-05 HISTORY — PX: HOT HEMOSTASIS: SHX5433

## 2020-04-05 LAB — BASIC METABOLIC PANEL
Anion gap: 9 (ref 5–15)
BUN: 21 mg/dL (ref 8–23)
CO2: 23 mmol/L (ref 22–32)
Calcium: 8.4 mg/dL — ABNORMAL LOW (ref 8.9–10.3)
Chloride: 102 mmol/L (ref 98–111)
Creatinine, Ser: 1.05 mg/dL — ABNORMAL HIGH (ref 0.44–1.00)
GFR calc Af Amer: >60 mL/min (ref >60)
GFR calc non Af Amer: 53 mL/min — ABNORMAL LOW (ref >60)
Glucose, Bld: 102 mg/dL — ABNORMAL HIGH (ref 70–99)
Potassium: 3.5 mmol/L (ref 3.5–5.1)
Sodium: 134 mmol/L — ABNORMAL LOW (ref 135–145)

## 2020-04-05 LAB — CBC WITH DIFFERENTIAL/PLATELET
Abs Immature Granulocytes: 0.05 10*3/uL (ref 0.00–0.07)
Basophils Absolute: 0 10*3/uL (ref 0.0–0.1)
Basophils Relative: 0 %
Eosinophils Absolute: 0 10*3/uL (ref 0.0–0.5)
Eosinophils Relative: 0 %
HCT: 22 % — ABNORMAL LOW (ref 36.0–46.0)
Hemoglobin: 7.1 g/dL — ABNORMAL LOW (ref 12.0–15.0)
Immature Granulocytes: 1 %
Lymphocytes Relative: 26 %
Lymphs Abs: 2.2 10*3/uL (ref 0.7–4.0)
MCH: 31.1 pg (ref 26.0–34.0)
MCHC: 32.3 g/dL (ref 30.0–36.0)
MCV: 96.5 fL (ref 80.0–100.0)
Monocytes Absolute: 0.5 10*3/uL (ref 0.1–1.0)
Monocytes Relative: 7 %
Neutro Abs: 5.6 10*3/uL (ref 1.7–7.7)
Neutrophils Relative %: 66 %
Platelets: 242 10*3/uL (ref 150–400)
RBC: 2.28 MIL/uL — ABNORMAL LOW (ref 3.87–5.11)
RDW: 13.7 % (ref 11.5–15.5)
WBC: 8.4 10*3/uL (ref 4.0–10.5)
nRBC: 0 % (ref 0.0–0.2)

## 2020-04-05 LAB — HEMOGLOBIN AND HEMATOCRIT, BLOOD
HCT: 23.5 % — ABNORMAL LOW (ref 36.0–46.0)
Hemoglobin: 7.6 g/dL — ABNORMAL LOW (ref 12.0–15.0)

## 2020-04-05 LAB — URINE CULTURE: Culture: 30000 — AB

## 2020-04-05 LAB — FERRITIN: Ferritin: 245 ng/mL (ref 11–307)

## 2020-04-05 LAB — VITAMIN B12: Vitamin B-12: 811 pg/mL (ref 180–914)

## 2020-04-05 LAB — FOLATE: Folate: 6 ng/mL (ref >5.9)

## 2020-04-05 LAB — IRON AND TIBC
Iron: 57 ug/dL (ref 28–170)
Saturation Ratios: 28 % (ref 10.4–31.8)
TIBC: 200 ug/dL — ABNORMAL LOW (ref 250–450)
UIBC: 143 ug/dL

## 2020-04-05 LAB — ABO/RH: ABO/RH(D): A POS

## 2020-04-05 LAB — PREPARE RBC (CROSSMATCH)

## 2020-04-05 SURGERY — ESOPHAGOGASTRODUODENOSCOPY (EGD) WITH PROPOFOL
Anesthesia: General

## 2020-04-05 MED ORDER — VASOPRESSIN 20 UNIT/ML IV SOLN
INTRAVENOUS | Status: DC | PRN
Start: 2020-04-05 — End: 2020-04-05
  Administered 2020-04-05 (×2): 2 [IU] via INTRAVENOUS

## 2020-04-05 MED ORDER — FENTANYL CITRATE (PF) 100 MCG/2ML IJ SOLN
INTRAMUSCULAR | Status: DC | PRN
  Administered 2020-04-05: 100 ug via INTRAVENOUS

## 2020-04-05 MED ORDER — SODIUM CHLORIDE 0.9% IV SOLUTION: Freq: Once | INTRAVENOUS | Status: AC

## 2020-04-05 MED ORDER — LIDOCAINE 2% (20 MG/ML) 5 ML SYRINGE
INTRAMUSCULAR | Status: DC | PRN
  Administered 2020-04-05: 100 mg via INTRAVENOUS

## 2020-04-05 MED ORDER — PROPOFOL 10 MG/ML IV BOLUS
INTRAVENOUS | Status: DC | PRN
  Administered 2020-04-05: 100 mg via INTRAVENOUS

## 2020-04-05 MED ORDER — ONDANSETRON HCL 4 MG/2ML IJ SOLN
INTRAMUSCULAR | Status: DC | PRN
  Administered 2020-04-05: 4 mg via INTRAVENOUS

## 2020-04-05 MED ORDER — PHENYLEPHRINE HCL-NACL 10-0.9 MG/250ML-% IV SOLN
INTRAVENOUS | Status: DC | PRN
Start: 2020-04-05 — End: 2020-04-05
  Administered 2020-04-05: 75 ug/min via INTRAVENOUS

## 2020-04-05 MED ORDER — DEXAMETHASONE SODIUM PHOSPHATE 10 MG/ML IJ SOLN
INTRAMUSCULAR | Status: DC | PRN
  Administered 2020-04-05: 5 mg via INTRAVENOUS

## 2020-04-05 MED ORDER — FENTANYL CITRATE (PF) 100 MCG/2ML IJ SOLN
INTRAMUSCULAR | Status: AC
  Filled 2020-04-05: qty 2

## 2020-04-05 MED ORDER — PHENOL 1.4 % MT LIQD
1.0000 | OROMUCOSAL | Status: DC | PRN
  Administered 2020-04-05: 1 via OROMUCOSAL
  Filled 2020-04-05: qty 177

## 2020-04-05 MED ORDER — PROMETHAZINE HCL 25 MG/ML IJ SOLN
12.5000 mg | Freq: Three times a day (TID) | INTRAMUSCULAR | Status: DC | PRN
  Administered 2020-04-05: 12.5 mg via INTRAVENOUS
  Filled 2020-04-05: qty 1

## 2020-04-05 MED ORDER — SUCCINYLCHOLINE CHLORIDE 200 MG/10ML IV SOSY
PREFILLED_SYRINGE | INTRAVENOUS | Status: DC | PRN
  Administered 2020-04-05: 100 mg via INTRAVENOUS

## 2020-04-05 MED ORDER — SODIUM CHLORIDE 0.9 % IV SOLN: INTRAVENOUS | Status: DC

## 2020-04-05 MED ORDER — SODIUM CHLORIDE 0.9 % IV SOLN
80.0000 mg | Freq: Once | INTRAVENOUS | Status: AC
  Administered 2020-04-05: 12:00:00 80 mg via INTRAVENOUS
  Filled 2020-04-05: qty 80

## 2020-04-05 MED ORDER — PANTOPRAZOLE SODIUM 40 MG IV SOLR
40.0000 mg | Freq: Two times a day (BID) | INTRAVENOUS | Status: DC
  Administered 2020-04-09 – 2020-04-10 (×4): 40 mg via INTRAVENOUS
  Filled 2020-04-05 (×4): qty 40

## 2020-04-05 MED ORDER — SODIUM CHLORIDE (PF) 0.9 % IJ SOLN
PREFILLED_SYRINGE | INTRAMUSCULAR | Status: DC | PRN
  Administered 2020-04-05: 5 mL

## 2020-04-05 MED ORDER — SODIUM CHLORIDE 0.9 % IV SOLN
8.0000 mg/h | INTRAVENOUS | Status: DC
  Administered 2020-04-05 – 2020-04-07 (×5): 8 mg/h via INTRAVENOUS
  Filled 2020-04-05 (×10): qty 80

## 2020-04-05 MED ORDER — ALBUMIN HUMAN 5 % IV SOLN: INTRAVENOUS | Status: DC | PRN

## 2020-04-05 MED ORDER — PHENYLEPHRINE 40 MCG/ML (10ML) SYRINGE FOR IV PUSH (FOR BLOOD PRESSURE SUPPORT)
PREFILLED_SYRINGE | INTRAVENOUS | Status: DC | PRN
  Administered 2020-04-05 (×3): 120 ug via INTRAVENOUS

## 2020-04-05 SURGICAL SUPPLY — 15 items

## 2020-04-05 NOTE — Anesthesia Postprocedure Evaluation (Signed)
Anesthesia Post Note  Patient: Susan Fernandez  Procedure(s) Performed: ESOPHAGOGASTRODUODENOSCOPY (EGD) WITH PROPOFOL (N/A ) SUBMUCOSAL INJECTION HOT HEMOSTASIS (ARGON PLASMA COAGULATION/BICAP) (N/A ) HEMOSTASIS CLIP PLACEMENT HEMOSTASIS CONTROL     Patient location during evaluation: PACU Anesthesia Type: General Level of consciousness: awake and alert, awake and oriented Pain management: pain level controlled Vital Signs Assessment: post-procedure vital signs reviewed and stable Respiratory status: spontaneous breathing, nonlabored ventilation, respiratory function stable and patient connected to nasal cannula oxygen Cardiovascular status: blood pressure returned to baseline and stable Postop Assessment: no apparent nausea or vomiting Anesthetic complications: no   No complications documented.  Last Vitals:  Vitals:   04/05/20 1615 04/05/20 1700  BP: (!) 109/51 (!) 102/55  Pulse: 91 99  Resp: 20 18  Temp: 37 C 36.9 C  SpO2: 100% 100%    Last Pain:  Vitals:   04/05/20 1700  TempSrc: Oral  PainSc:                  Cecile Hearing

## 2020-04-05 NOTE — Progress Notes (Signed)
   04/05/20 1112  Assess: MEWS Score  Temp 98.9 F (37.2 C)  BP (!) 114/60  Pulse Rate 104  Resp 16  SpO2 100 %  Assess: MEWS Score  MEWS Temp 0  MEWS Systolic 0  MEWS Pulse 1  MEWS RR 0  MEWS LOC 0  MEWS Score 1  MEWS Score Color Green  MD notified for new onset vomit aprox 400 mls dark emesis, Concerned of bleeding MD notified see orders.

## 2020-04-05 NOTE — Anesthesia Preprocedure Evaluation (Addendum)
Anesthesia Evaluation  Patient identified by MRN, date of birth, ID band Patient awake    Reviewed: Allergy & Precautions, NPO status , Patient's Chart, lab work & pertinent test results  Airway Mallampati: II  TM Distance: >3 FB Neck ROM: Full    Dental  (+) Dental Advisory Given, Partial Lower, Partial Upper   Pulmonary neg pulmonary ROS,    Pulmonary exam normal breath sounds clear to auscultation       Cardiovascular hypertension, Pt. on medications (-) angina+ Peripheral Vascular Disease  (-) Past MI Normal cardiovascular exam Rhythm:Regular Rate:Normal     Neuro/Psych CVA, Residual Symptoms negative psych ROS   GI/Hepatic Neg liver ROS, GERD  ,Today had onset of vomiting of coffee-ground material-about 400 cc per nursing   Endo/Other  Morbid obesity  Renal/GU Renal InsufficiencyRenal disease     Musculoskeletal negative musculoskeletal ROS (+)   Abdominal   Peds  Hematology  (+) Blood dyscrasia, anemia ,   Anesthesia Other Findings Day of surgery medications reviewed with the patient.  Reproductive/Obstetrics                           Anesthesia Physical Anesthesia Plan  ASA: III  Anesthesia Plan: General   Post-op Pain Management:    Induction: Intravenous, Rapid sequence and Cricoid pressure planned  PONV Risk Score and Plan: 3 and Dexamethasone and Ondansetron  Airway Management Planned: Oral ETT  Additional Equipment:   Intra-op Plan:   Post-operative Plan: Extubation in OR  Informed Consent: I have reviewed the patients History and Physical, chart, labs and discussed the procedure including the risks, benefits and alternatives for the proposed anesthesia with the patient or authorized representative who has indicated his/her understanding and acceptance.     Dental advisory given  Plan Discussed with: CRNA  Anesthesia Plan Comments:         Anesthesia  Quick Evaluation

## 2020-04-05 NOTE — Op Note (Signed)
[facility] Patient Name: Susan Fernandez Procedure Date : 04/05/2020 MRN: [id number] Attending MD: Wilhemina Bonito. Marina Goodell , MD Date of Birth: 09/09/1946 CSN: [id number] Age: 74 Admit Type: Inpatient Procedure:                Upper GI endoscopy with control of bleeding                            (epinephrine, BiCAP, hemospray) Indications:              Acute post hemorrhagic anemia, Coffee-ground emesis Providers:                Wilhemina Bonito. Marina Goodell, MD, Zoe Lan, RN, Kandice Robinsons, Technician Referring MD:             [facility] hospitalist Medicines:                Monitored Anesthesia Care Complications:            No immediate complications. Estimated Blood Loss:     Estimated blood loss: none. Procedure:                Pre-Anesthesia Assessment:                           - Prior to the procedure, a History and Physical                            was performed, and patient medications and                            allergies were reviewed. The patient's tolerance of                            previous anesthesia was also reviewed. The risks                            and benefits of the procedure and the sedation                            options and risks were discussed with the patient.                            All questions were answered, and informed consent                            was obtained. Prior Anticoagulants: The patient has                            taken no previous anticoagulant or antiplatelet                            agents. ASA Grade Assessment: II - A patient with  mild systemic disease. After reviewing the risks                            and benefits, the patient was deemed in                            satisfactory condition to undergo the procedure.                           After obtaining informed consent, the endoscope was                            passed under direct vision. Throughout the                             procedure, the patient's blood pressure, pulse, and                            oxygen saturations were monitored continuously. The                            GIF-H190 ([id number] Olympus gastroscope was                            introduced through the mouth, and advanced to the                            second part of duodenum. The upper GI endoscopy was                            accomplished without difficulty. The patient                            tolerated the procedure well. Scope In: Scope Out: Findings:      The esophagus was normal.      The proximal stomach was replaced with large fresh blood clots. There       was one small nonbleeding ulcer measuring 8 mm on the greater curve.       There was one giant oozing cratered gastric ulcer with a visible vessel       was found at the incisura. The lesion was 40 x 30 mm in largest       dimension. Area was successfully injected with 5 mL of a 1:10,000       solution of epinephrine for hemostasis. Coagulation for hemostasis using       bipolar probe was successful. A hemostatic clip was placed on the ulcers       edge and a heaped up margin just above the visible vessel. This was       placed for the purposes of marking should interventional embolization be       required. Finally, for additional hemostasis, hemostatic spray was       deployed. There was no bleeding at the end of the procedure.      The examined duodenum was normal.      The cardia and gastric fundus were normal on retroflexion.  Impression:               1. Giant ulcer on the gastric incisura with active                            bleeding as described. Status post multiple                            endoscopic modalities for hemostasis                           2. Small nonbleeding gastric ulcer                           3. Proximal stomach could not be visualized due to                            large volume dense clots                            4. The esophagus and duodenum were normal. Recommendation:           1. Please transfuse to hemoglobin of 8                           2. Please monitor for evidence of rebleeding                            (hematemesis, melena, drop in blood pressure, drop                            in hemoglobin)                           3. Should the patient have acute recurrent                            bleeding, I would recommend urgent interventional                            radiology consult for the purposes of embolization                            therapy                           4. Continue IV PPI                           5. Check H. pylori antibody                           6. Will need relook EGD prior to discharge to                            reassess the ulcer and clear the proximal stomach  Case discussed with patient. She was provided a                            copy of her report.                           . Procedure Code(s):        --- Professional ---                           [phone], Esophagogastroduodenoscopy, flexible,                            transoral; with control of bleeding, any method Diagnosis Code(s):        --- Professional ---                           K25.4, Chronic or unspecified gastric ulcer with                            hemorrhage                           D62, Acute posthemorrhagic anemia                           K92.0, Hematemesis CPT copyright 2019 [employer]. All rights reserved. The codes documented in this report are preliminary and upon coder review may  be revised to meet current compliance requirements. Wilhemina Bonito. Marina Goodell, MD 04/05/2020 3:59:09 PM This report has been signed electronically. Number of Addenda: 0

## 2020-04-05 NOTE — Transfer of Care (Signed)
Immediate Anesthesia Transfer of Care Note  Patient: Susan Fernandez  Procedure(s) Performed: ESOPHAGOGASTRODUODENOSCOPY (EGD) WITH PROPOFOL (N/A ) SUBMUCOSAL INJECTION HOT HEMOSTASIS (ARGON PLASMA COAGULATION/BICAP) (N/A ) HEMOSTASIS CLIP PLACEMENT HEMOSTASIS CONTROL  Patient Location: PACU  Anesthesia Type:General  Level of Consciousness: awake, alert  and patient cooperative  Airway & Oxygen Therapy: Patient Spontanous Breathing  Post-op Assessment: Report given to RN and Post -op Vital signs reviewed and stable  Post vital signs: Reviewed and stable  Last Vitals:  Vitals Value Taken Time  BP    Temp    Pulse    Resp    SpO2      Last Pain:  Vitals:   04/05/20 1424  TempSrc: Oral  PainSc: 0-No pain         Complications: No complications documented.

## 2020-04-05 NOTE — Consult Note (Addendum)
Consultation  Referring Provider:  [facility]/ Sharolyn Douglas Primary Care Physician:  [name], MD Primary Gastroenterologist:  unassigned  Reason for Consultation:   Gi bleed- coffee ground emesis  HPI: Susan Fernandez is a 74 y.o. female, who was admitted through the emergency room yesterday after presenting with 2-week history of nausea and vomiting.  She was noted to have orthostatic hypotension. She underwent imaging with CT of the abdomen and pelvis that showed some bilateral atelectasis, gallbladder stones and sludge without gallbladder wall thickening or pericholecystic fluid and no ductal dilation.,  Normal-appearing small bowel and colon with scattered diverticulosis. On admission WBC of 13.3 and hemoglobin of 8.6.  Since admit she has had drop in hemoglobin to 7.1 today.  Iron studies have been done and she is not iron deficient. Today had onset of vomiting of coffee-ground material-about 400 cc per nursing No prior endoscopic evaluation. She does take baby aspirin at home.  She relates sometimes she has to stop taking the baby aspirin because it irritates her stomach. Patient says her appetite has been gone over the past several weeks at home and every time she eats she feels full and uncomfortable.  Again she confirms no melena or hematochezia at home.  Other medical issues include GERD, hypertension, morbid obesity, prior CVA February 2021, and chronic kidney disease stage III.   Past Medical History:  Diagnosis Date  . Cataract   . GERD (gastroesophageal reflux disease)   . Hyperlipidemia   . Hypertension 1995  . IBS (irritable bowel syndrome)   . Morbid obesity (HCC)   . 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 Posterior vitreous detachment 10/29/2015  . Prediabetes 2016  . Right thalamic infarction (HCC) 11/03/2019   Lacunar R thalamic infarction without hemorrhage or shift per MRI 11/03/2019  . Venous insufficiency     Past Surgical History:  Procedure  Laterality Date  . CATARACT EXTRACTION, BILATERAL Bilateral 2018   L- 01/2017, R-02/2017  . RETINAL TEAR REPAIR CRYOTHERAPY Bilateral 2000   Dr. Cecilie Kicks    Prior to Admission medications   Medication Sig Start Date End Date Taking? Authorizing Provider  amLODipine (NORVASC) 5 MG tablet Take 1 tablet Daily for BP Patient taking differently: Take 5 mg by mouth daily.  02/23/20  Yes [name], MD  atorvastatin (LIPITOR) 80 MG tablet Take 1 tablet Daily for Cholesterol Patient taking differently: Take 80 mg by mouth every evening.  02/23/20  Yes [name], MD  aspirin EC 81 MG EC tablet Take 1 tablet (81 mg total) by mouth daily. Patient not taking: Reported on 04/03/2020 11/05/19   Gwenyth Bender, NP  fluticasone [facility]) 50 MCG/ACT nasal spray PLACE 2 SPRAYS INTO BOTH NOSTRILS DAILY AS NEEDED. Patient not taking: Reported on 04/03/2020 02/09/20   [name], MD    Current Facility-Administered Medications  Medication Dose Route Frequency Provider Last Rate Last Admin  . 0.9 %  sodium chloride infusion (Manually program via Guardrails IV Fluids)   Intravenous Once Briant Cedar, MD      . 0.9 %  sodium chloride infusion   Intravenous Continuous Briant Cedar, MD      . HYDROcodone-acetaminophen (NORCO/VICODIN) 5-325 MG per tablet 1 tablet  1 tablet Oral Q8H PRN Briant Cedar, MD      . ondansetron [facility]) tablet 4 mg  4 mg Oral Q6H PRN [name], Ava, DO   4 mg at 04/04/20 0748   Or  . ondansetron (ZOFRAN) injection 4 mg  4  mg Intravenous Q6H PRN [name], Ava, DO   4 mg at 04/05/20 0746  . pantoprazole (PROTONIX) 80 mg in sodium chloride 0.9 % 100 mL (0.8 mg/mL) infusion  8 mg/hr Intravenous Continuous Briant Cedar, MD      . pantoprazole (PROTONIX) 80 mg in sodium chloride 0.9 % 100 mL IVPB  80 mg Intravenous Once Briant Cedar, MD      . Melene Muller ON 04/09/2020] pantoprazole (PROTONIX) injection 40 mg  40 mg Intravenous Q12H Briant Cedar,  MD      . promethazine (PHENERGAN) injection 12.5 mg  12.5 mg Intravenous Q8H PRN Briant Cedar, MD      . sodium chloride flush (NS) 0.9 % injection 3 mL  3 mL Intravenous Once Mancel Bale, MD        Allergies as of 04/03/2020 - Review Complete 04/03/2020  Allergen Reaction Noted  . Ppd [tuberculin purified protein derivative]  04/07/2015  . Sulfa antibiotics Other (See Comments) 04/07/2015  . Tolectin [tolmetin]  04/07/2015    No family history on file.  Social History   Socioeconomic History  . Marital status: Single    Spouse name: Not on file  . Number of children: Not on file  . Years of education: Not on file  . Highest education level: Not on file  Occupational History  . Not on file  Tobacco Use  . Smoking status: Never Smoker  . Smokeless tobacco: Never Used  Substance and Sexual Activity  . Alcohol use: Not on file  . Drug use: Not on file  . Sexual activity: Not on file  Other Topics Concern  . Not on file  Social History Narrative  . Not on file   Social Determinants of Health   Financial Resource Strain:   . Difficulty of Paying Living Expenses:   Food Insecurity:   . Worried About Programme researcher, broadcasting/film/video in the Last Year:   . Barista in the Last Year:   Transportation Needs:   . Freight forwarder (Medical):   Marland Kitchen Lack of Transportation (Non-Medical):   Physical Activity:   . Days of Exercise per Week:   . Minutes of Exercise per Session:   Stress:   . Feeling of Stress :   Social Connections:   . Frequency of Communication with Friends and Family:   . Frequency of Social Gatherings with Friends and Family:   . Attends Religious Services:   . Active Member of Clubs or Organizations:   . Attends Banker Meetings:   Marland Kitchen Marital Status:   Intimate Partner Violence:   . Fear of Current or Ex-Partner:   . Emotionally Abused:   Marland Kitchen Physically Abused:   . Sexually Abused:     Review of Systems: Pertinent positive and  negative review of systems were noted in the above HPI section.  All other review of systems was otherwise negative.  Physical Exam: Vital signs in last 24 hours: Temp:  [98 F (36.7 C)-98.9 F (37.2 C)] 98.9 F (37.2 C) (07/25 1112) Pulse Rate:  [85-104] 104 (07/25 1112) Resp:  [16-17] 16 (07/25 1112) BP: (114-140)/(57-74) 114/60 (07/25 1112) SpO2:  [97 %-100 %] 100 % (07/25 1112) Last BM Date: 04/03/20 General:   Alert,  Well-developed, well-nourished, ill-appearing elderly African-American female pleasant and cooperative in NAD Head:  Normocephalic and atraumatic. Eyes:  Sclera clear, no icterus.   Conjunctiva pink. Ears:  Normal auditory acuity. Nose:  No deformity, discharge,  or  lesions. Mouth:  No deformity or lesions.   Neck:  Supple; no masses or thyromegaly. Lungs:  Clear throughout to auscultation.   No wheezes, crackles, or rhonchi. Heart:  Regular rate and rhythm; no murmurs, clicks, rubs,  or gallops. Abdomen:  Soft, mildly tender in the epigastrium no guarding BS active,nonpalp mass or hsm.   Rectal:  Deferred  Msk:  Symmetrical without gross deformities. . Pulses:  Normal pulses noted. Extremities:  Without clubbing or edema. Neurologic:  Alert and  oriented x4;  grossly normal neurologically. Skin:  Intact without significant lesions or rashes.. Psych:  Alert and cooperative. Normal mood and affect.  Intake/Output from previous day: 07/24 0701 - 07/25 0700 In: 120 [P.O.:120] Out: 100 [Urine:100] Intake/Output this shift: Total I/O In: 177 [P.O.:177] Out: 400 [Emesis/NG output:400]  Lab Results: Recent Labs    04/03/20 2354 04/04/20 0442 04/05/20 0612  WBC 13.3* 11.3* 8.4  HGB 8.6* 8.6* 7.1*  HCT 26.6* 26.2* 22.0*  PLT 245 255 242   BMET Recent Labs    04/03/20 1207 04/03/20 1207 04/03/20 2354 04/04/20 0442 04/05/20 0612  NA 136  --   --  136 134*  K 3.2*  --   --  4.0 3.5  CL 96*  --   --  101 102  CO2 21*  --   --  25 23  GLUCOSE 155*   --   --  108* 102*  BUN 46*  --   --  44* 21  CREATININE 1.59*   < > 1.43* 1.29* 1.05*  CALCIUM 9.3  --   --  8.6* 8.4*   < > = values in this interval not displayed.   LFT Recent Labs    04/03/20 1355 04/03/20 1355 04/04/20 0442  PROT 8.0   < > 6.3*  ALBUMIN 3.0*   < > 2.5*  AST 58*   < > 43*  ALT 23   < > 20  ALKPHOS 56   < > 47  BILITOT 1.9*   < > 0.6  BILIDIR 0.6*  --   --   IBILI 1.3*  --   --    < > = values in this interval not displayed.   PT/INR No results for input(s): LABPROT, INR in the last 72 hours. Hepatitis Panel No results for input(s): HEPBSAG, HCVAB, HEPAIGM, HEPBIGM in the last 72 hours.    IMPRESSION:  #22 74 year old African-American female admitted yesterday with 2-week history of nausea vomiting, lack of appetite and complaint of fullness and bloating with any p.o. intake.  Patient has baseline hemoglobin of about 13, hemoglobin 8.6 on admission and down to 7.1 today. Patient witnessed having about 400 cc of coffee-ground emesis today.  She says when she vomited on Friday it was also very dark.  Suspect acute and subacute GI bleed, rule out peptic ulcer disease, chronic gastropathy, ulcerative esophagitis, neoplasm  #2 chronic kidney disease stage III 3.  Prior history of CVA only on baby aspirin 4.  Hypertension 5.  Hyperlipidemia  Plan; keep n.p.o. Transfusing 1 unit now, check hemoglobin every 6 hours and transfuse to keep hemoglobin between 7 and 8 IV PPI infusion has been started We will proceed with upper endoscopy with Dr. Marina Goodell this afternoon.  Procedure was discussed in detail with the patient including indications risks and benefits and she is agreeable to proceed. Further recommendations pending findings at EGD.  Amy Esterwood PA-C 04/05/2020, 11:50 AM  GI ATTENDING  History, laboratories, x-rays reviewed.  Patient  personally seen and examined.  Agree with comprehensive consultation note as outlined above.  [age] year old with multiple  medical problems presents with 2-week history of nausea and vomiting.  Found to be dehydrated.  Anemic from baseline.  Coffee-ground emesis.  Agree with PPI and transfusion.  Plans for urgent upper endoscopy today.  Patient is high risk given her acute illness and comorbidities.The nature of the procedure, as well as the risks, benefits, and alternatives were carefully and thoroughly reviewed with the patient. Ample time for discussion and questions allowed. The patient understood, was satisfied, and agreed to proceed.  Wilhemina BonitoJohn N. Eda KeysPerry, Jr., M.D. [facility]

## 2020-04-05 NOTE — Progress Notes (Signed)
PROGRESS NOTE  Susan Fernandez UVO:[id number] DOB: 01-23-1946 DOA: 04/03/2020 PCP: [name], MD  HPI/Recap of past 24 hours: HPI from Dr Gerri Lins 74 yr old woman who presentes with complaints of weakness, nausea and vomiting, that has been ongoing for 2 weeks. Patient carries a past medical history significant for pre-diabetes, venous insufficiency, GERD, HLD, HTN, morbid obesity, history of right thalamic infarction, and CKD III. In the ED she was found to have orthostasis. Otherwise, RR is normal, oxygen saturations, and heart rate is normal. Labwork on presentation demonstrated low potassium at 3.2, creatinine of 1.59, elevated T bili at 1.9, elevated WBC at 13.3 and hemoglobin of 8.6. CT abdomen demonstrated a gallbladder full of sludge/stones. RUQ ultrasound was normal. [facility] were consulted to admit the patient for further evaluation and care.     Today, patient continues to report feeling unwell, with very poor appetite, generalized abdominal discomfort and overall weakness.  Shortly after my visit, patient noted to have about 400 cc of coffee-ground material per nursing.  GI consulted stat.   Assessment/Plan: Principal Problem:   Nausea and vomiting Active Problems:   HTN (hypertension)   Hyperlipidemia   Obesity (BMI 30.0-34.9)   CKD (chronic kidney disease) stage 3, GFR 30-59 ml/min   Acute gastric ulcer with hemorrhage    Upper GI bleed likely 2/2 large gastric ulcer Intractable nausea/vomiting Noted to have about 400 cc of coffee-ground emesis on 04/05/2020 LFTs WNL except for T bili 1.9 on admission, currently T bili WNL Lipase WNL CT abdomen/pelvis showed gallbladder with mixture of biliary stones and sludge without features of acute cholecystitis Right upper quadrant ultrasound was normal, showed no gallstones or wall thickening GI consulted, status post emergent endoscopy which showed giant gastric ulcer with active bleeding status post epi,  coagulation, and hemostatic clip and spray for hemostasis.  Noted small nonbleeding gastric ulcers, large volume dense clots in the proximal stomach.  Plan for repeat EGD prior to discharge to reassess also unclear the proximal stomach If any recurrent hematemesis, urgent IR consult for embolization therapy Continue IV PPI, IV fluids Serial CBC checks N.p.o. Monitor closely  Acute blood loss anemia Likely from above Baseline hemoglobin around 12, dropped down to 7.1 on 04/05/2020 Anemia panel WNL Transfuse PRBC if hemoglobin less than 8 Serial CBC checks  AKI on CKD stage IIIa Improving Creatinine baseline around 1.3, on admission 1.59 Likely 2/2 above Continue IV fluids Daily BMP  Orthostatic hypotension Likely 2/2 GI bleed/poor oral intake IV fluids  Leukocytosis Possibly ??reactive UA showed large leukocytes, rare bacteria, greater than 50 WBC UC multiple species BC x2 pending collection Chest x-ray showed mild right basilar atelectasis Daily CBC  Hypertension Hold BP meds due to GI bleed  Obesity Lifestyle modification advised       Malnutrition Type:      Malnutrition Characteristics:      Nutrition Interventions:       Estimated body mass index is 39.39 kg/m as calculated from the following:   Height as of this encounter: 5\' 9"  (1.753 m).   Weight as of this encounter: 121 kg.     Code Status: Partial code  Family Communication: Discussed extensively with patient  Disposition Plan: Status is: Inpatient  Remains inpatient appropriate because:Inpatient level of care appropriate due to severity of illness   Dispo: The patient is from: Home              Anticipated d/c is to: Home  Anticipated d/c date is: 2 days              Patient currently is not medically stable to d/c.    Consultants:  GI  Procedures:  EGD  Antimicrobials:  None  DVT prophylaxis: SCDs   Objective: Vitals:   04/05/20 1243 04/05/20 1343  04/05/20 1424 04/05/20 1600  BP: (!) 116/63 (!) 113/62 (!) 98/63 (!) 135/59  Pulse: 100 100 93 (!) 110  Resp: 17 16 18 16   Temp: 98 F (36.7 C)  98.8 F (37.1 C) 98.6 F (37 C)  TempSrc: Oral  Oral   SpO2: 94% 98% 100% 94%  Weight:   (!) 121 kg   Height:   5\' 9"  (1.753 m)     Intake/Output Summary (Last 24 hours) at 04/05/2020 1613 Last data filed at 04/05/2020 1543 Gross per 24 hour  Intake 936.92 ml  Output 400 ml  Net 536.92 ml   Filed Weights   04/03/20 1155 04/04/20 0740 04/05/20 1424  Weight: (!) 117.9 kg (!) 121.1 kg (!) 121 kg    Exam:  General: NAD, deconditioned, chronically ill-appearing  Cardiovascular: S1, S2 present  Respiratory: CTAB  Abdomen: Soft, generalized tenderness, nondistended, bowel sounds present  Musculoskeletal: No bilateral pedal edema noted  Skin: Normal  Psychiatry: Normal mood   Data Reviewed: CBC: Recent Labs  Lab 04/03/20 1207 04/03/20 2354 04/04/20 0442 04/05/20 0612  WBC 9.3 13.3* 11.3* 8.4  NEUTROABS  --   --   --  5.6  HGB 10.2* 8.6* 8.6* 7.1*  HCT 31.8* 26.6* 26.2* 22.0*  MCV 96.4 95.0 95.3 96.5  PLT 300 245 255 242   Basic Metabolic Panel: Recent Labs  Lab 04/03/20 1207 04/03/20 2354 04/04/20 0442 04/05/20 0612  NA 136  --  136 134*  K 3.2*  --  4.0 3.5  CL 96*  --  101 102  CO2 21*  --  25 23  GLUCOSE 155*  --  108* 102*  BUN 46*  --  44* 21  CREATININE 1.59* 1.43* 1.29* 1.05*  CALCIUM 9.3  --  8.6* 8.4*   GFR: Estimated Creatinine Clearance: 67.4 mL/min (A) (by C-G formula based on SCr of 1.05 mg/dL (H)). Liver Function Tests: Recent Labs  Lab 04/03/20 1355 04/04/20 0442  AST 58* 43*  ALT 23 20  ALKPHOS 56 47  BILITOT 1.9* 0.6  PROT 8.0 6.3*  ALBUMIN 3.0* 2.5*   Recent Labs  Lab 04/03/20 1355  LIPASE 39   No results for input(s): AMMONIA in the last 168 hours. Coagulation Profile: No results for input(s): INR, PROTIME in the last 168 hours. Cardiac Enzymes: No results for input(s):  CKTOTAL, CKMB, CKMBINDEX, TROPONINI in the last 168 hours. BNP (last 3 results) No results for input(s): PROBNP in the last 8760 hours. HbA1C: No results for input(s): HGBA1C in the last 72 hours. CBG: Recent Labs  Lab 04/03/20 1257  GLUCAP 157*   Lipid Profile: No results for input(s): CHOL, HDL, LDLCALC, TRIG, CHOLHDL, LDLDIRECT in the last 72 hours. Thyroid Function Tests: No results for input(s): TSH, T4TOTAL, FREET4, T3FREE, THYROIDAB in the last 72 hours. Anemia Panel: Recent Labs    04/05/20 0612  VITAMINB12 811  FOLATE 6.0  FERRITIN 245  TIBC 200*  IRON 57   Urine analysis:    Component Value Date/Time   COLORURINE YELLOW 04/04/2020 0807   APPEARANCEUR CLOUDY (A) 04/04/2020 0807   LABSPEC 1.027 04/04/2020 0807   PHURINE 6.0 04/04/2020 04/06/2020  GLUCOSEU NEGATIVE 04/04/2020 0807   HGBUR SMALL (A) 04/04/2020 0807   BILIRUBINUR NEGATIVE 04/04/2020 0807   KETONESUR 5 (A) 04/04/2020 0807   PROTEINUR 30 (A) 04/04/2020 0807   NITRITE NEGATIVE 04/04/2020 0807   LEUKOCYTESUR LARGE (A) 04/04/2020 0807   Sepsis Labs: @LABRCNTIP (procalcitonin:4,lacticidven:4)  ) Recent Results (from the past 240 hour(s))  SARS Coronavirus 2 by RT PCR (hospital order, performed in [facility] hospital lab) Nasopharyngeal Nasopharyngeal Swab     Status: None   Collection Time: 04/03/20 10:29 PM   Specimen: Nasopharyngeal Swab  Result Value Ref Range Status   SARS Coronavirus 2 NEGATIVE NEGATIVE Final    Comment: (NOTE) SARS-CoV-2 target nucleic acids are NOT DETECTED.  The SARS-CoV-2 RNA is generally detectable in upper and lower respiratory specimens during the acute phase of infection. The lowest concentration of SARS-CoV-2 viral copies this assay can detect is 250 copies / mL. A negative result does not preclude SARS-CoV-2 infection and should not be used as the sole basis for treatment or other patient management decisions.  A negative result may occur with improper specimen  collection / handling, submission of specimen other than nasopharyngeal swab, presence of viral mutation(s) within the areas targeted by this assay, and inadequate number of viral copies (<250 copies / mL). A negative result must be combined with clinical observations, patient history, and epidemiological information.  Fact Sheet for Patients:   04/05/20  Fact Sheet for Healthcare Providers: BoilerBrush.com.cy  This test is not yet approved or  cleared by the https://pope.com/ FDA and has been authorized for detection and/or diagnosis of SARS-CoV-2 by FDA under an Emergency Use Authorization (EUA).  This EUA will remain in effect (meaning this test can be used) for the duration of the COVID-19 declaration under Section 564(b)(1) of the Act, 21 U.S.C. section 360bbb-3(b)(1), unless the authorization is terminated or revoked sooner.  Performed at [facility], [address], [place], [place]   Culture, Urine     Status: Abnormal   Collection Time: 04/04/20  3:16 PM   Specimen: Urine, Random  Result Value Ref Range Status   Specimen Description URINE, RANDOM  Final   Special Requests   Final    NONE Performed at [facility], [address], [place], [place]    Culture (A)  Final    30,000 COLONIES/mL MULTIPLE SPECIES PRESENT, SUGGEST RECOLLECTION   Report Status 04/05/2020 FINAL  Final      Studies: No results found.  Scheduled Meds: . [MAR Hold] pantoprazole  40 mg Intravenous Q12H  . [MAR Hold] sodium chloride flush  3 mL Intravenous Once    Continuous Infusions: . sodium chloride 100 mL/hr at 04/05/20 1513  . pantoprozole (PROTONIX) infusion 8 mg/hr (04/05/20 1259)     LOS: 2 days     04/07/20, MD [facility]  If 7PM-7AM, please contact night-coverage [email or web address] 04/05/2020, 4:13 PM

## 2020-04-05 NOTE — Anesthesia Procedure Notes (Signed)
Procedure Name: Intubation Date/Time: 04/05/2020 3:21 PM Performed by: Janace Litten, CRNA Pre-anesthesia Checklist: Patient identified, Emergency Drugs available, Suction available and Patient being monitored Patient Re-evaluated:Patient Re-evaluated prior to induction Oxygen Delivery Method: Circle System Utilized Preoxygenation: Pre-oxygenation with 100% oxygen Induction Type: IV induction, Rapid sequence and Cricoid Pressure applied Laryngoscope Size: Mac and 3 Grade View: Grade I Tube type: Oral Tube size: 7.0 mm Number of attempts: 1 Airway Equipment and Method: Stylet Placement Confirmation: ETT inserted through vocal cords under direct vision,  positive ETCO2 and breath sounds checked- equal and bilateral Secured at: 20 cm Tube secured with: Tape Dental Injury: Teeth and Oropharynx as per pre-operative assessment

## 2020-04-05 NOTE — Progress Notes (Signed)
PT Cancellation Note  Patient Details Name: Susan Fernandez MRN: [id number] DOB: 11/30/1945   Cancelled Treatment:    Reason Eval/Treat Not Completed: Medical issues which prohibited therapy   Discussed pt status with Jomarie Longs, RN;  We decided to hold PT evaluation at this time, as pt's Hgb is low, and she had new onset vomit earlier with dark emesis; Blood transfusion started;   Van Clines, PT  Acute Rehabilitation Services Pager [phone] Office [phone]    Levi Aland 04/05/2020, 2:17 PM

## 2020-04-06 DIAGNOSIS — D62 Acute posthemorrhagic anemia: Secondary | ICD-10-CM | POA: Diagnosis not present

## 2020-04-06 DIAGNOSIS — R112 Nausea with vomiting, unspecified: Secondary | ICD-10-CM | POA: Diagnosis not present

## 2020-04-06 DIAGNOSIS — K250 Acute gastric ulcer with hemorrhage: Secondary | ICD-10-CM | POA: Diagnosis not present

## 2020-04-06 LAB — BASIC METABOLIC PANEL
Anion gap: 9 (ref 5–15)
BUN: 36 mg/dL — ABNORMAL HIGH (ref 8–23)
CO2: 22 mmol/L (ref 22–32)
Calcium: 8.1 mg/dL — ABNORMAL LOW (ref 8.9–10.3)
Chloride: 107 mmol/L (ref 98–111)
Creatinine, Ser: 1.16 mg/dL — ABNORMAL HIGH (ref 0.44–1.00)
GFR calc Af Amer: 54 mL/min — ABNORMAL LOW (ref >60)
GFR calc non Af Amer: 47 mL/min — ABNORMAL LOW (ref >60)
Glucose, Bld: 119 mg/dL — ABNORMAL HIGH (ref 70–99)
Potassium: 3.9 mmol/L (ref 3.5–5.1)
Sodium: 138 mmol/L (ref 135–145)

## 2020-04-06 LAB — HEMOGLOBIN AND HEMATOCRIT, BLOOD
HCT: 18.8 % — ABNORMAL LOW (ref 36.0–46.0)
HCT: 25.9 % — ABNORMAL LOW (ref 36.0–46.0)
Hemoglobin: 6.1 g/dL — CL (ref 12.0–15.0)
Hemoglobin: 8.5 g/dL — ABNORMAL LOW (ref 12.0–15.0)

## 2020-04-06 LAB — PREPARE RBC (CROSSMATCH)

## 2020-04-06 LAB — H. PYLORI ANTIBODY, IGG: H Pylori IgG: 5.25 Index Value — ABNORMAL HIGH (ref 0.00–0.79)

## 2020-04-06 MED ORDER — FUROSEMIDE 10 MG/ML IJ SOLN
20.0000 mg | Freq: Once | INTRAMUSCULAR | Status: AC
  Administered 2020-04-06: 20 mg via INTRAVENOUS
  Filled 2020-04-06: qty 2

## 2020-04-06 MED ORDER — SODIUM CHLORIDE 0.9% IV SOLUTION: Freq: Once | INTRAVENOUS | Status: AC

## 2020-04-06 MED ORDER — DIPHENHYDRAMINE HCL 25 MG PO CAPS
25.0000 mg | ORAL_CAPSULE | Freq: Once | ORAL | Status: AC
  Administered 2020-04-06: 25 mg via ORAL
  Filled 2020-04-06: qty 1

## 2020-04-06 MED ORDER — ACETAMINOPHEN 325 MG PO TABS
650.0000 mg | ORAL_TABLET | Freq: Once | ORAL | Status: AC
  Administered 2020-04-06: 650 mg via ORAL
  Filled 2020-04-06: qty 2

## 2020-04-06 NOTE — Progress Notes (Signed)
Called by RN. Hemoglobin dropped to 6.1 from 7.6 yesterday at 6:20 PM.  Transfuse 2 units of packed red blood cells. Order placed in chart

## 2020-04-06 NOTE — Progress Notes (Signed)
Daily Rounding Note  04/06/2020, 9:18 AM  LOS: 3 days   SUBJECTIVE:   Chief complaint: CGE, gastric ulcers, blood loss anemia    Feels well.  No N/V, no SOB or dizziness.  No abd pain. A unit of PRBCs has completed infusion. No tachycardia. SBPs in 1teens to 120s.    OBJECTIVE:         Vital signs in last 24 hours:    Temp:  [98 F (36.7 C)-99.2 F (37.3 C)] 98.8 F (37.1 C) (07/26 0838) Pulse Rate:  [82-110] 94 (07/26 0838) Resp:  [16-22] 20 (07/26 0838) BP: (98-138)/(51-86) 114/54 (07/26 0838) SpO2:  [94 %-100 %] 100 % (07/26 0838) Weight:  [100 kg-121 kg] 100 kg (07/26 0535) Last BM Date: 04/04/20 Filed Weights   04/04/20 0740 04/05/20 1424 04/06/20 0535  Weight: (!) 121.1 kg (!) 121 kg (!) 100 kg   General: looks well, alert.   Heart: RRR Chest: clear bil.   Abdomen: soft, NT, ND.  Active BS  Extremities: no CCE Neuro/Psych:  Calm, fluid speech, oriented x 3.  No tremors or weakness.    Intake/Output from previous day: 07/25 0701 - 07/26 0700 In: 3338.4 [P.O.:807; I.V.:2281.4; IV Piggyback:250] Out: 1100 [Urine:700; Emesis/NG output:400]  Intake/Output this shift: No intake/output data recorded.  Lab Results: Recent Labs    04/03/20 2354 04/03/20 2354 04/04/20 0442 04/04/20 0442 04/05/20 0612 04/05/20 1824 04/06/20 0130  WBC 13.3*  --  11.3*  --  8.4  --   --   HGB 8.6*   < > 8.6*   < > 7.1* 7.6* 6.1*  HCT 26.6*   < > 26.2*   < > 22.0* 23.5* 18.8*  PLT 245  --  255  --  242  --   --    < > = values in this interval not displayed.   BMET Recent Labs    04/04/20 0442 04/05/20 0612 04/06/20 0611  NA 136 134* 138  K 4.0 3.5 3.9  CL 101 102 107  CO2 25 23 22   GLUCOSE 108* 102* 119*  BUN 44* 21 36*  CREATININE 1.29* 1.05* 1.16*  CALCIUM 8.6* 8.4* 8.1*   LFT Recent Labs    04/03/20 1355 04/04/20 0442  PROT 8.0 6.3*  ALBUMIN 3.0* 2.5*  AST 58* 43*  ALT 23 20  ALKPHOS 56 47    BILITOT 1.9* 0.6  BILIDIR 0.6*  --   IBILI 1.3*  --    PT/INR No results for input(s): LABPROT, INR in the last 72 hours. Hepatitis Panel No results for input(s): HEPBSAG, HCVAB, HEPAIGM, HEPBIGM in the last 72 hours.  Studies/Results: DG Chest Port 1 View  Result Date: 04/04/2020 CLINICAL DATA:  Leukocytosis, weakness, nausea, vomiting EXAM: PORTABLE CHEST 1 VIEW COMPARISON:  Portable exam 1602 hours compared to 03/14/2012 FINDINGS: Normal heart size, mediastinal contours, and pulmonary vascularity. Rotated to the RIGHT. Mild RIGHT basilar atelectasis. Lungs otherwise clear. No infiltrate, pleural effusion or pneumothorax. IMPRESSION: Mild RIGHT basilar atelectasis. Electronically Signed   By: 05/15/2012 M.D.   On: 04/04/2020 16:15   Scheduled Meds: . furosemide  20 mg Intravenous Once  . [START ON 04/09/2020] pantoprazole  40 mg Intravenous Q12H  . sodium chloride flush  3 mL Intravenous Once   Continuous Infusions: . sodium chloride 100 mL/hr at 04/06/20 0630  . pantoprozole (PROTONIX) infusion 8 mg/hr (04/06/20 0630)   PRN Meds:.HYDROcodone-acetaminophen, ondansetron **OR** ondansetron (ZOFRAN) IV, phenol, promethazine  ASSESMENT:   *   GI bleed with CGE, 2 weeks N/V.  81 mg ASA at home. 04/06/11 EGD: Giant, oozing gastric ulcer w vv at [facility] treated with epinephrine, BiCAP cauterization, Hemoclip placement x 1, hemospray.  Smaller nonbleeding gastric ulcer.  Proximal stomach not visualized due to dense clots of blood.  Examined duodenum, normal esophagus. No previous colonoscopy pt always declines per note Day 1-2 Protonix drip.  *   Blood loss anemia. Hgb 8.6 > 7.1 >> 6.1.  MCV 95.  S/p 2 PRBCs, 1 early AM 7/25, 1 now.    *    CVA 10/2019.  *    CKD 3.     PLAN   *   Relook EGD prior to discharge to reassess ulcer and obtain visualization of proximal stomach.  *   PPI drip finishes 7/28 and 12:30 AM.    *   Serum H Pylori Ab pdg.  Fup Hgb.Hct to be obtaned.     *   Stay w clear liquids.      Jennye Moccasin  04/06/2020, 9:18 AM Phone [phone]

## 2020-04-06 NOTE — Progress Notes (Signed)
   04/06/20 1338  Mobility  Activity Refused mobility (Holding mobility d/t patient Hgb)  $Mobility charge 0

## 2020-04-06 NOTE — Progress Notes (Signed)
CRITICAL VALUE ALERT  Critical Value:  Hemoglobin 6.1  Date & Time Notied:  04/06/2020 at 0148  Provider Notified: on call with [facility]   Orders Received/Actions taken: orders to be entered per [facility] on call

## 2020-04-06 NOTE — Evaluation (Signed)
Physical Therapy Evaluation Patient Details Name: Susan Fernandez MRN: [id number] DOB: Oct 23, 1945 Today's Date: 04/06/2020   History of Present Illness  Pt is a 74 yr old woman who presentes with complaints of weakness, nausea and vomiting. She states that this has been ongoing for 2 weeks. Pt additionally found to be anemic and with GIB due to large ulcers.  Pt's hgb was 6.1 this am but she has received 2 units PRBC.  Past medical history significant for pre-diabetes, Venous insufficiency, GERD, Hyperlipidemia, Hypertension, morbid obesity, history of right thalamic infarction Feb 2021, and CKD III.  Clinical Impression   Pt admitted with above diagnosis. Pt was limited at evaluation due to anxiety, fear of falling, lightheadedness, and elevated HR/BP.  Pt demonstrated decreased hip strength in sitting.  Pt reports decreased sensation in bil LE below knees that has been present since stroke in February.  She required at least min A for transfers and only able to ambulate 3' with RW and min A due to anxiety and lightheadedness.  Pt's HR and BP increased with standing -likely due to agitation and fear of falling.  Pt does have several factors that could be contributing to lightheadedness including prior CVA, anemia, recent bout N/V, and did have orthostatic hypotension earlier per MD notes but was not at this time.  Would benefit from further PT assessment to r/o vestibular cause - although not expected based on current symptoms (occurs in standing and not affected by head turns/rolling).  Additionally, pt lives alone and from PT perspective unsafe at this time to return home alone.  Educated on recommendation for SNF , but pt is declining at this time-encouraged to consider. Pt currently with functional limitations due to the deficits listed below (see PT Problem List). Pt will benefit from skilled PT to increase their independence and safety with mobility to allow discharge to the venue listed below.        Follow Up Recommendations SNF    Equipment Recommendations  3in1 (PT)    Recommendations for Other Services       Precautions / Restrictions Precautions Precautions: Fall Precaution Comments: dizziness Restrictions Weight Bearing Restrictions: No      Mobility  Bed Mobility Overal bed mobility: Needs Assistance Bed Mobility: Supine to Sit;Sit to Supine     Supine to sit: Min guard Sit to supine: Min guard   General bed mobility comments: Cues to scoot to EOB  Transfers Overall transfer level: Needs assistance Equipment used: Rolling walker (2 wheeled) Transfers: Sit to/from Stand Sit to Stand: Min assist         General transfer comment: Min A to power up; cues for safe hand placement; increased time and effort from pt  Ambulation/Gait Ambulation/Gait assistance: Mod assist Gait Distance (Feet): 3 Feet Assistive device: Rolling walker (2 wheeled) Gait Pattern/deviations: Step-through pattern;Shuffle Gait velocity: decreased   General Gait Details: Attempted to have pt take a few steps forward at EOB while waiting for 3 min blood pressure.  Pt took 1-2 steps , became suddenly agitated, stating "my legs are numb, your not listening, I can't do this." Had pt return to bed.  Stairs            Wheelchair Mobility    Modified Rankin (Stroke Patients Only)       Balance Overall balance assessment: Needs assistance Sitting-balance support: No upper extremity supported;Feet supported Sitting balance-Leahy Scale: Fair     Standing balance support: Bilateral upper extremity supported Standing balance-Leahy Scale: Poor Standing balance  comment: reliant on RW                             Pertinent Vitals/Pain Pain Assessment: No/denies pain    Home Living Family/patient expects to be discharged to:: Private residence Living Arrangements: Alone Available Help at Discharge: Family;Available PRN/intermittently Type of Home:  House Home Access: Stairs to enter Entrance Stairs-Rails: Right Entrance Stairs-Number of Steps: 2 in front and at garage Home Layout: One level Home Equipment: Walker - 2 wheels      Prior Function Level of Independence: Independent with assistive device(s)   Gait / Transfers Assistance Needed: Reports can ambulate in community "on a good day." Uses a RW most of the time and grocery cart in store.  ADL's / Homemaking Assistance Needed: Reports independent with ADLs  Comments: Reports frequent episodes of lightheadedness since strokes in February.  States "feels like she will pass out." Reports this occurs when up and moving.  Has difficulty standing long enough to cook and walking around store due to lightheadedness.  Has worsened over the past 3 weeks with N/V.     Hand Dominance   Dominant Hand: Right    Extremity/Trunk Assessment   Upper Extremity Assessment Upper Extremity Assessment: Defer to OT evaluation    Lower Extremity Assessment Lower Extremity Assessment: RLE deficits/detail;LLE deficits/detail (Pt c/o numbness in bil legs when she was standing and became agitated.  With further questioning - this is always present and was decreased light touch.  Pt was able to identify location of light touch reports just feels diminished.) RLE Deficits / Details: ROM WFL; MMT hip 4/5, knee and ankle 5/5 RLE Sensation: decreased light touch LLE Deficits / Details: ROM WFL; MMT hip 4/5, knee and ankle 5/5 LLE Sensation: decreased light touch    Cervical / Trunk Assessment Cervical / Trunk Assessment: Normal  Communication   Communication: No difficulties  Cognition Arousal/Alertness: Awake/alert Behavior During Therapy: Agitated Overall Cognitive Status: Within Functional Limits for tasks assessed                                 General Comments:  Pt was alert and oriented x 4.  However, difficult at times to obtain clear timeline of symptoms -required repeated  questions and examples.  Pt also expressed extreme fear of falling and became very agitated in standing when asked to take a few steps.  Pt was reassured and calmed down.  Does seem to have decreased awareness of deficits as she hopes to return home alone and reports does not want to go to rehab.       General Comments General comments (skin integrity, edema, etc.):   Orthostatic BPs  Supine 123/67 and HR 80s  Sitting 136/70 and HR 80s  Standing  148/76 and HR 120's pt expressing anxiousness and fear of falling  Attempted standing 3 mins but only up for 2 mins, became agitated, this BP upon immediate sit 163/120 and HR 140's for short period of time  Return to supine 121/103 and HR 80's   PT explained purpose of getting orthostatic Bps prior and the process.  Pt agreeable.  Upon standing pt reports fear of falling and anxiety.  While waiting for 3 mins BP, attempted to have pt take steps forward but pt became very anxious/agitated.  States "I can't do this," slammed walker on floor then pushed forward to the  sink "I made it."  Had pt return to sitting and took remaining BP.  Pt's HR and BP elevated when anxious/agitated but returned to normal once calm and PT further explained process.    Attempted to gain history of dizziness but at times pt unclear with timeline requiring repeated questions with examples.   Asked to describe dizziness and she states lightheadedness, fear of falling, but no visual changes or sensations of spinning.  States that it occurs when she stands up and has difficulty standing to cook or grocery shop.  Pt states that she "knows better than to get up when this occurs," but then states that it is occurring most of the time and that she still has to get up.  Does seem to have worsened in the past few weeks (with nausea/vomitting/unable to eat).  Is not affected by rolling or head turns.   Pt was educated on PT role/POC and recommendation for SNF as she lives alone. Pt is very  against SNF at d/c.  Encouraged her to consider as she is unsafe to mobilize at this time and lives alone, but that we would work with therapy to see if could progress our recommendation.  However, informed her that it is up to her.      Exercises     Assessment/Plan    PT Assessment Patient needs continued PT services  PT Problem List Decreased strength;Decreased mobility;Decreased safety awareness;Decreased range of motion;Decreased coordination;Decreased knowledge of precautions;Decreased activity tolerance;Decreased balance;Decreased knowledge of use of DME;Cardiopulmonary status limiting activity       PT Treatment Interventions DME instruction;Therapeutic activities;Gait training;Therapeutic exercise;Patient/family education;Stair training;Balance training;Functional mobility training;Neuromuscular re-education    PT Goals (Current goals can be found in the Care Plan section)  Acute Rehab PT Goals Patient Stated Goal: to go home PT Goal Formulation: With patient Time For Goal Achievement: 04/20/20 Potential to Achieve Goals: Good    Frequency Min 3X/week   Barriers to discharge Decreased caregiver support      Co-evaluation               AM-PAC PT "6 Clicks" Mobility  Outcome Measure Help needed turning from your back to your side while in a flat bed without using bedrails?: None Help needed moving from lying on your back to sitting on the side of a flat bed without using bedrails?: None Help needed moving to and from a bed to a chair (including a wheelchair)?: A Little Help needed standing up from a chair using your arms (e.g., wheelchair or bedside chair)?: A Little Help needed to walk in hospital room?: A Lot Help needed climbing 3-5 steps with a railing? : A Lot 6 Click Score: 18    End of Session Equipment Utilized During Treatment: Gait belt Activity Tolerance: Patient limited by fatigue;Other (comment) (limited by fear of falling /  lightheadedness) Patient left: in bed;with call bell/phone within reach;with bed alarm set Nurse Communication: Mobility status;Other (comment) (IV alarming, vitals, pt's fear of falling/agitation with standing) PT Visit Diagnosis: Dizziness and giddiness (R42);Other abnormalities of gait and mobility (R26.89);Muscle weakness (generalized) (M62.81)    Time: 4696-2952 PT Time Calculation (min) (ACUTE ONLY): 46 min   Charges:   PT Evaluation $PT Eval Moderate Complexity: 1 Mod PT Treatments $Therapeutic Activity: 23-37 mins        Anise Salvo, PT Acute Rehab Services Pager [phone] [facility] [phone]    Rayetta Humphrey 04/06/2020, 5:39 PM

## 2020-04-06 NOTE — Evaluation (Signed)
Occupational Therapy Evaluation Patient Details Name: Susan Fernandez MRN: [id number] DOB: 05-Mar-1946 Today's Date: 04/06/2020    History of Present Illness Pt is a 74 yr old woman who presentes with complaints of weakness, nausea and vomiting. She states that this has been ongoing for 2 weeks. past medical history significant for pre-diabetes, Venous insufficiency, GERD, Hyperlipidemia, Hypertension, morbid obesity, history of right thalamic infarction, and CKD III.   Clinical Impression   Pt PTA: Pt living alone and reports independence with RW occasionally for mobility. Pt seen after x2 blood transfusions. Pt currently limited by decreased strength, decreased activity tolerance, decreased mobility and decreased ability to care for self; pt with significant fear of falling. Pt supervisionA for bed mobility with increased time; pt modA for sit to stand and taking a few steps forward and to [facility]. Pt reports dizziness with positional change to conclude session- when asked several times throughout movement, pt denied dizziness.  BP at EOB 120/80. O2 >95% on RA after exertion. Pt would benefit from continued OT skilled services. OT following acutely.      Follow Up Recommendations  SNF;Supervision - Intermittent (hope to progress to [facility])    Equipment Recommendations  3 in 1 bedside commode    Recommendations for Other Services       Precautions / Restrictions Precautions Precautions: None Precaution Comments: dizziness Restrictions Weight Bearing Restrictions: No      Mobility Bed Mobility Overal bed mobility: Needs Assistance Bed Mobility: Supine to Sit;Sit to Supine     Supine to sit: Supervision Sit to supine: Supervision   General bed mobility comments: Cues to scoot to EOB  Transfers Overall transfer level: Needs assistance Equipment used: Rolling walker (2 wheeled) Transfers: Sit to/from Stand Sit to Stand: Mod assist         General transfer comment: ModA for  power up; pt took increased time to figure out hand placement    Balance Overall balance assessment: Needs assistance Sitting-balance support: Bilateral upper extremity supported;Feet supported Sitting balance-Leahy Scale: Fair     Standing balance support: Bilateral upper extremity supported Standing balance-Leahy Scale: Poor Standing balance comment: reliant on RW                           ADL either performed or assessed with clinical judgement   ADL Overall ADL's : Needs assistance/impaired Eating/Feeding: NPO   Grooming: Set up;Sitting Grooming Details (indicate cue type and reason): Pt refusing to perform tasks at EOB due to many IVs/lines connected to her. Upper Body Bathing: Minimal assistance;Sitting   Lower Body Bathing: Moderate assistance;Cueing for safety;Sitting/lateral leans;Sit to/from stand   Upper Body Dressing : Minimal assistance;Sitting   Lower Body Dressing: Moderate assistance;Sitting/lateral leans;Sit to/from stand   Toilet Transfer: Minimal assistance;Stand-pivot;BSC;RW   Toileting- Clothing Manipulation and Hygiene: Moderate assistance;Cueing for safety;Sitting/lateral lean;Sit to/from stand       Functional mobility during ADLs: Minimal assistance;Rolling walker;Cueing for safety General ADL Comments: Pt limited by decreased strength, decreased activity tolerance, decreased mobility and decreased ability to care for self; pt with significant fear of falling     Vision Baseline Vision/History: No visual deficits Patient Visual Report: No change from baseline Vision Assessment?: No apparent visual deficits     Perception     Praxis      Pertinent Vitals/Pain Pain Assessment: No/denies pain     Hand Dominance Right   Extremity/Trunk Assessment Upper Extremity Assessment Upper Extremity Assessment: Generalized weakness   Lower Extremity  Assessment Lower Extremity Assessment: Defer to PT evaluation   Cervical / Trunk  Assessment Cervical / Trunk Assessment: Normal   Communication Communication Communication: No difficulties   Cognition Arousal/Alertness: Awake/alert Behavior During Therapy: WFL for tasks assessed/performed Overall Cognitive Status: Within Functional Limits for tasks assessed                                     General Comments  BP at EOB 120/80. O2 >95% on RA.    Exercises     Shoulder Instructions      Home Living Family/patient expects to be discharged to:: Private residence Living Arrangements: Alone Available Help at Discharge: Family;Available PRN/intermittently Type of Home: House Home Access: Stairs to enter [employer] of Steps: 2 in front and at garage Entrance Stairs-Rails: Right Home Layout: One level     Bathroom Shower/Tub: Chief Strategy Officer: Handicapped height     Home Equipment: Environmental consultant - 2 wheels;Cane - single point          Prior Functioning/Environment Level of Independence: Independent with assistive device(s)                 OT Problem List: Decreased strength;Decreased activity tolerance;Impaired balance (sitting and/or standing);Pain;Decreased safety awareness;Decreased cognition      OT Treatment/Interventions: Self-care/ADL training;Therapeutic exercise;DME and/or AE instruction;Therapeutic activities;Patient/family education;Balance training;Energy conservation    OT Goals(Current goals can be found in the care plan section) Acute Rehab OT Goals Patient Stated Goal: to go home OT Goal Formulation: With patient Time For Goal Achievement: 04/20/20 Potential to Achieve Goals: Fair ADL Goals Pt Will Perform Grooming: with supervision;standing Pt Will Perform Lower Body Dressing: sitting/lateral leans;sit to/from stand;with supervision Pt Will Transfer to Toilet: ambulating;bedside commode;with supervision Pt Will Perform Toileting - Clothing Manipulation and hygiene: with  supervision;sitting/lateral leans;sit to/from stand Pt/caregiver will Perform Home Exercise Program: Increased strength;With written HEP provided Additional ADL Goal #1: Pt will increase to x5 mins standing for OOB ADL tasks with good safety awareness.  OT Frequency: Min 2X/week   Barriers to D/C:            Co-evaluation              AM-PAC OT "6 Clicks" Daily Activity     Outcome Measure Help from another person eating meals?: None Help from another person taking care of personal grooming?: A Little Help from another person toileting, which includes using toliet, bedpan, or urinal?: A Lot Help from another person bathing (including washing, rinsing, drying)?: A Lot Help from another person to put on and taking off regular upper body clothing?: A Little Help from another person to put on and taking off regular lower body clothing?: A Lot 6 Click Score: 16   End of Session Equipment Utilized During Treatment: Gait belt;Rolling walker Nurse Communication: Mobility status  Activity Tolerance: Patient limited by pain Patient left: in bed;with call bell/phone within reach;with bed alarm set  OT Visit Diagnosis: Unsteadiness on feet (R26.81);Muscle weakness (generalized) (M62.81)                Time: 1459-1530 OT Time Calculation (min): 31 min Charges:  OT General Charges $OT Visit: 1 Visit OT Evaluation $OT Eval Moderate Complexity: 1 Mod OT Treatments $Therapeutic Activity: 8-22 mins  Flora Lipps, OTR/L Acute Rehabilitation Services Pager: [phone] Office: [phone]   Lucas Winograd C 04/06/2020, 4:06 PM

## 2020-04-06 NOTE — Progress Notes (Signed)
OT Cancellation Note  Patient Details Name: Susan Fernandez MRN: [id number] DOB: [birth year]   Cancelled Treatment:    Reason Eval/Treat Not Completed: Medical issues which prohibited therapy (Pt getting blood due to low hg of 6.1. OT following acutely.)  OT to see later today for OT eval as appropriate/if schedule allows.  Flora Lipps, OTR/L Acute Rehabilitation Services Pager: [phone] Office: [phone]  Flora Lipps 04/06/2020, 9:41 AM

## 2020-04-06 NOTE — Progress Notes (Signed)
PROGRESS NOTE  Susan Fernandez QQI:[id number] DOB: [birth year] DOA: 04/03/2020 PCP: [name], MD  HPI/Recap of past 24 hours: HPI from Dr Susan Fernandez 74 yr old woman who presentes with complaints of weakness, nausea and vomiting, that has been ongoing for 2 weeks. Patient carries a past medical history significant for pre-diabetes, venous insufficiency, GERD, HLD, HTN, morbid obesity, history of right thalamic infarction, and CKD III. In the ED she was found to have orthostasis. Otherwise, RR is normal, oxygen saturations, and heart rate is normal. Labwork on presentation demonstrated low potassium at 3.2, creatinine of 1.59, elevated T bili at 1.9, elevated WBC at 13.3 and hemoglobin of 8.6. CT abdomen demonstrated a gallbladder full of sludge/stones. RUQ ultrasound was normal. [facility] were consulted to admit the patient for further evaluation and care.    Today, patient reports feeling better, denies any further coffee-ground vomitus, denies any nausea, abdominal pain, chest pain, shortness of breath, fever/chills.  Brother at bedside     Assessment/Plan: Principal Problem:   Nausea and vomiting Active Problems:   HTN (hypertension)   Hyperlipidemia   Obesity (BMI 30.0-34.9)   CKD (chronic kidney disease) stage 3, GFR 30-59 ml/min   Acute gastric ulcer with hemorrhage    Upper GI bleed likely 2/2 large gastric ulcer Intractable nausea/vomiting Noted to have about 400 cc of coffee-ground emesis on 04/05/2020 LFTs WNL except for T bili 1.9 on admission, currently T bili WNL Lipase WNL CT abdomen/pelvis showed gallbladder with mixture of biliary stones and sludge without features of acute cholecystitis Right upper quadrant ultrasound was normal, showed no gallstones or wall thickening GI consulted, status post emergent endoscopy on 04/05/2020 which showed giant gastric ulcer with active bleeding status post epi, coagulation, and hemostatic clip and spray for  hemostasis.  Noted small nonbleeding gastric ulcers, large volume dense clots in the proximal stomach.  Plan for repeat EGD prior to discharge to reassess also and to clear the proximal stomach If any recurrent hematemesis, urgent IR consult for embolization therapy Continue IV PPI, IV fluids Serial CBC checks Clear liquid diet Monitor closely  Acute blood loss anemia Likely from above Baseline hemoglobin around 12, dropped down to 7.1 on 04/05/2020 Anemia panel WNL Transfuse PRBC if hemoglobin less than 8, so far 3 units of PRBC transfused Serial CBC checks  AKI on CKD stage IIIa Improving Creatinine baseline around 1.3, on admission 1.59 Likely 2/2 above Continue IV fluids Daily BMP  Orthostatic hypotension Likely 2/2 GI bleed/poor oral intake IV fluids  Leukocytosis Possibly ??reactive UA showed large leukocytes, rare bacteria, greater than 50 WBC UC multiple species BC x2 NGTD Chest x-ray showed mild right basilar atelectasis Daily CBC  Hypertension Hold BP meds due to GI bleed  Obesity Lifestyle modification advised       Malnutrition Type:      Malnutrition Characteristics:      Nutrition Interventions:       Estimated body mass index is 32.56 kg/m as calculated from the following:   Height as of this encounter: 5\' 9"  (1.753 m).   Weight as of this encounter: 100 kg.     Code Status: Partial code  Family Communication: Discussed extensively with patient and brother at bedside  Disposition Plan: Status is: Inpatient  Remains inpatient appropriate because:Inpatient level of care appropriate due to severity of illness   Dispo: The patient is from: Home              Anticipated d/c is to: Home  Anticipated d/c date is: 2 days              Patient currently is not medically stable to d/c.    Consultants:  GI  Procedures:  EGD  Antimicrobials:  None  DVT prophylaxis: SCDs   Objective: Vitals:   04/06/20 0838  04/06/20 1034 04/06/20 1109 04/06/20 1128  BP: (!) 114/54 (!) 118/54 (!) 117/57 (!) 133/61  Pulse: 94 84 80 103  Resp: 20 16 16    Temp: 98.8 F (37.1 C) 98.7 F (37.1 C) 99.1 F (37.3 C) 99.3 F (37.4 C)  TempSrc: Oral Oral Oral Oral  SpO2: 100% 100% 100% 100%  Weight:      Height:        Intake/Output Summary (Last 24 hours) at 04/06/2020 1421 Last data filed at 04/06/2020 1030 Gross per 24 hour  Intake 3865.35 ml  Output 700 ml  Net 3165.35 ml   Filed Weights   04/04/20 0740 04/05/20 1424 04/06/20 0535  Weight: (!) 121.1 kg (!) 121 kg (!) 100 kg    Exam:  General: NAD, deconditioned, chronically ill-appearing  Cardiovascular: S1, S2 present  Respiratory: CTAB  Abdomen: Soft, generalized tenderness, nondistended, bowel sounds present  Musculoskeletal: No bilateral pedal edema noted  Skin: Normal  Psychiatry: Normal mood   Data Reviewed: CBC: Recent Labs  Lab 04/03/20 1207 04/03/20 1207 04/03/20 2354 04/04/20 0442 04/05/20 0612 04/05/20 1824 04/06/20 0130  WBC 9.3  --  13.3* 11.3* 8.4  --   --   NEUTROABS  --   --   --   --  5.6  --   --   HGB 10.2*   < > 8.6* 8.6* 7.1* 7.6* 6.1*  HCT 31.8*   < > 26.6* 26.2* 22.0* 23.5* 18.8*  MCV 96.4  --  95.0 95.3 96.5  --   --   PLT 300  --  245 255 242  --   --    < > = values in this interval not displayed.   Basic Metabolic Panel: Recent Labs  Lab 04/03/20 1207 04/03/20 2354 04/04/20 0442 04/05/20 0612 04/06/20 0611  NA 136  --  136 134* 138  K 3.2*  --  4.0 3.5 3.9  CL 96*  --  101 102 107  CO2 21*  --  25 23 22   GLUCOSE 155*  --  108* 102* 119*  BUN 46*  --  44* 21 36*  CREATININE 1.59* 1.43* 1.29* 1.05* 1.16*  CALCIUM 9.3  --  8.6* 8.4* 8.1*   GFR: Estimated Creatinine Clearance: 55.2 mL/min (A) (by C-G formula based on SCr of 1.16 mg/dL (H)). Liver Function Tests: Recent Labs  Lab 04/03/20 1355 04/04/20 0442  AST 58* 43*  ALT 23 20  ALKPHOS 56 47  BILITOT 1.9* 0.6  PROT 8.0 6.3*    ALBUMIN 3.0* 2.5*   Recent Labs  Lab 04/03/20 1355  LIPASE 39   No results for input(s): AMMONIA in the last 168 hours. Coagulation Profile: No results for input(s): INR, PROTIME in the last 168 hours. Cardiac Enzymes: No results for input(s): CKTOTAL, CKMB, CKMBINDEX, TROPONINI in the last 168 hours. BNP (last 3 results) No results for input(s): PROBNP in the last 8760 hours. HbA1C: No results for input(s): HGBA1C in the last 72 hours. CBG: Recent Labs  Lab 04/03/20 1257  GLUCAP 157*   Lipid Profile: No results for input(s): CHOL, HDL, LDLCALC, TRIG, CHOLHDL, LDLDIRECT in the last 72 hours. Thyroid Function Tests: No results  for input(s): TSH, T4TOTAL, FREET4, T3FREE, THYROIDAB in the last 72 hours. Anemia Panel: Recent Labs    04/05/20 0612  VITAMINB12 811  FOLATE 6.0  FERRITIN 245  TIBC 200*  IRON 57   Urine analysis:    Component Value Date/Time   COLORURINE YELLOW 04/04/2020 0807   APPEARANCEUR CLOUDY (A) 04/04/2020 0807   LABSPEC 1.027 04/04/2020 0807   PHURINE 6.0 04/04/2020 0807   GLUCOSEU NEGATIVE 04/04/2020 0807   HGBUR SMALL (A) 04/04/2020 0807   BILIRUBINUR NEGATIVE 04/04/2020 0807   KETONESUR 5 (A) 04/04/2020 0807   PROTEINUR 30 (A) 04/04/2020 0807   NITRITE NEGATIVE 04/04/2020 0807   LEUKOCYTESUR LARGE (A) 04/04/2020 0807   Sepsis Labs: @LABRCNTIP (procalcitonin:4,lacticidven:4)  ) Recent Results (from the past 240 hour(s))  SARS Coronavirus 2 by RT PCR (hospital order, performed in [facility] hospital lab) Nasopharyngeal Nasopharyngeal Swab     Status: None   Collection Time: 04/03/20 10:29 PM   Specimen: Nasopharyngeal Swab  Result Value Ref Range Status   SARS Coronavirus 2 NEGATIVE NEGATIVE Final    Comment: (NOTE) SARS-CoV-2 target nucleic acids are NOT DETECTED.  The SARS-CoV-2 RNA is generally detectable in upper and lower respiratory specimens during the acute phase of infection. The lowest concentration of SARS-CoV-2 viral  copies this assay can detect is 250 copies / mL. A negative result does not preclude SARS-CoV-2 infection and should not be used as the sole basis for treatment or other patient management decisions.  A negative result may occur with improper specimen collection / handling, submission of specimen other than nasopharyngeal swab, presence of viral mutation(s) within the areas targeted by this assay, and inadequate number of viral copies (<250 copies / mL). A negative result must be combined with clinical observations, patient history, and epidemiological information.  Fact Sheet for Patients:   04/05/20  Fact Sheet for Healthcare Providers: BoilerBrush.com.cy  This test is not yet approved or  cleared by the https://pope.com/ FDA and has been authorized for detection and/or diagnosis of SARS-CoV-2 by FDA under an Emergency Use Authorization (EUA).  This EUA will remain in effect (meaning this test can be used) for the duration of the COVID-19 declaration under Section 564(b)(1) of the Act, 21 U.S.C. section 360bbb-3(b)(1), unless the authorization is terminated or revoked sooner.  Performed at [facility], [address], [place], [place]   Culture, Urine     Status: Abnormal   Collection Time: 04/04/20  3:16 PM   Specimen: Urine, Random  Result Value Ref Range Status   Specimen Description URINE, RANDOM  Final   Special Requests   Final    NONE Performed at [facility], [address] [place], [place]    Culture (A)  Final    30,000 COLONIES/mL MULTIPLE SPECIES PRESENT, SUGGEST RECOLLECTION   Report Status 04/05/2020 FINAL  Final  Culture, blood (routine x 2)     Status: None (Preliminary result)   Collection Time: 04/05/20  6:20 AM   Specimen: BLOOD  Result Value Ref Range Status   Specimen Description BLOOD RIGHT ANTECUBITAL  Final   Special Requests   Final    BOTTLES DRAWN AEROBIC  AND ANAEROBIC Blood Culture adequate volume   Culture   Final    NO GROWTH 1 DAY Performed at [facility], [address], [place], [place]    Report Status PENDING  Incomplete  Culture, blood (routine x 2)     Status: None (  Preliminary result)   Collection Time: 04/05/20  6:25 AM   Specimen: BLOOD RIGHT HAND  Result Value Ref Range Status   Specimen Description BLOOD RIGHT HAND  Final   Special Requests   Final    BOTTLES DRAWN AEROBIC AND ANAEROBIC Blood Culture adequate volume   Culture   Final    NO GROWTH 1 DAY Performed at [facility], [address] [place], [place] [address]    Report Status PENDING  Incomplete      Studies: No results found.  Scheduled Meds: . [START ON 04/09/2020] pantoprazole  40 mg Intravenous Q12H  . sodium chloride flush  3 mL Intravenous Once    Continuous Infusions: . sodium chloride 100 mL/hr at 04/06/20 0630  . pantoprozole (PROTONIX) infusion 8 mg/hr (04/06/20 1149)     LOS: 3 days     Briant Cedar, MD [facility]  If 7PM-7AM, please contact night-coverage [email or web address] 04/06/2020, 2:21 PM

## 2020-04-07 DIAGNOSIS — N1831 Chronic kidney disease, stage 3a: Secondary | ICD-10-CM

## 2020-04-07 DIAGNOSIS — D62 Acute posthemorrhagic anemia: Secondary | ICD-10-CM | POA: Diagnosis not present

## 2020-04-07 DIAGNOSIS — K254 Chronic or unspecified gastric ulcer with hemorrhage: Secondary | ICD-10-CM | POA: Diagnosis not present

## 2020-04-07 LAB — CBC WITH DIFFERENTIAL/PLATELET
Abs Immature Granulocytes: 0.06 10*3/uL (ref 0.00–0.07)
Abs Immature Granulocytes: 0.07 10*3/uL (ref 0.00–0.07)
Basophils Absolute: 0 10*3/uL (ref 0.0–0.1)
Basophils Absolute: 0 10*3/uL (ref 0.0–0.1)
Basophils Relative: 0 %
Basophils Relative: 0 %
Eosinophils Absolute: 0 10*3/uL (ref 0.0–0.5)
Eosinophils Absolute: 0 10*3/uL (ref 0.0–0.5)
Eosinophils Relative: 0 %
Eosinophils Relative: 0 %
HCT: 23.5 % — ABNORMAL LOW (ref 36.0–46.0)
HCT: 29 % — ABNORMAL LOW (ref 36.0–46.0)
Hemoglobin: 7.7 g/dL — ABNORMAL LOW (ref 12.0–15.0)
Hemoglobin: 9.4 g/dL — ABNORMAL LOW (ref 12.0–15.0)
Immature Granulocytes: 1 %
Immature Granulocytes: 1 %
Lymphocytes Relative: 32 %
Lymphocytes Relative: 33 %
Lymphs Abs: 2.9 10*3/uL (ref 0.7–4.0)
Lymphs Abs: 3.1 10*3/uL (ref 0.7–4.0)
MCH: 29.3 pg (ref 26.0–34.0)
MCH: 29.1 pg (ref 26.0–34.0)
MCHC: 32.8 g/dL (ref 30.0–36.0)
MCHC: 32.4 g/dL (ref 30.0–36.0)
MCV: 89.4 fL (ref 80.0–100.0)
MCV: 89.8 fL (ref 80.0–100.0)
Monocytes Absolute: 0.6 10*3/uL (ref 0.1–1.0)
Monocytes Absolute: 0.4 10*3/uL (ref 0.1–1.0)
Monocytes Relative: 7 %
Monocytes Relative: 4 %
Neutro Abs: 5.4 10*3/uL (ref 1.7–7.7)
Neutro Abs: 6 10*3/uL (ref 1.7–7.7)
Neutrophils Relative %: 60 %
Neutrophils Relative %: 62 %
Platelets: 163 10*3/uL (ref 150–400)
Platelets: 165 10*3/uL (ref 150–400)
RBC: 2.63 MIL/uL — ABNORMAL LOW (ref 3.87–5.11)
RBC: 3.23 MIL/uL — ABNORMAL LOW (ref 3.87–5.11)
RDW: 17.5 % — ABNORMAL HIGH (ref 11.5–15.5)
RDW: 17.2 % — ABNORMAL HIGH (ref 11.5–15.5)
WBC: 9.1 10*3/uL (ref 4.0–10.5)
WBC: 9.6 10*3/uL (ref 4.0–10.5)
nRBC: 1.1 % — ABNORMAL HIGH (ref 0.0–0.2)
nRBC: 1 % — ABNORMAL HIGH (ref 0.0–0.2)

## 2020-04-07 LAB — PREPARE RBC (CROSSMATCH)

## 2020-04-07 LAB — BASIC METABOLIC PANEL
Anion gap: 7 (ref 5–15)
BUN: 28 mg/dL — ABNORMAL HIGH (ref 8–23)
CO2: 24 mmol/L (ref 22–32)
Calcium: 7.9 mg/dL — ABNORMAL LOW (ref 8.9–10.3)
Chloride: 107 mmol/L (ref 98–111)
Creatinine, Ser: 1.13 mg/dL — ABNORMAL HIGH (ref 0.44–1.00)
GFR calc Af Amer: 56 mL/min — ABNORMAL LOW (ref >60)
GFR calc non Af Amer: 49 mL/min — ABNORMAL LOW (ref >60)
Glucose, Bld: 89 mg/dL (ref 70–99)
Potassium: 3.3 mmol/L — ABNORMAL LOW (ref 3.5–5.1)
Sodium: 138 mmol/L (ref 135–145)

## 2020-04-07 LAB — HEMOGLOBIN AND HEMATOCRIT, BLOOD
HCT: 22.8 % — ABNORMAL LOW (ref 36.0–46.0)
Hemoglobin: 7.6 g/dL — ABNORMAL LOW (ref 12.0–15.0)

## 2020-04-07 MED ORDER — SODIUM CHLORIDE 0.9% IV SOLUTION: Freq: Once | INTRAVENOUS | Status: DC

## 2020-04-07 MED ORDER — METOCLOPRAMIDE HCL 5 MG/ML IJ SOLN
10.0000 mg | Freq: Four times a day (QID) | INTRAMUSCULAR | Status: DC
  Administered 2020-04-07 – 2020-04-08 (×2): 10 mg via INTRAVENOUS
  Filled 2020-04-07 (×2): qty 2

## 2020-04-07 NOTE — Progress Notes (Signed)
PROGRESS NOTE  Susan Fernandez BDZ:[id number] DOB: 16-Sep-1945 DOA: 04/03/2020 PCP: [name], MD  HPI/Recap of past 24 hours: HPI from Dr Gerri Lins 74 yr old woman who presentes with complaints of weakness, nausea and vomiting, that has been ongoing for 2 weeks. Patient carries a past medical history significant for pre-diabetes, venous insufficiency, GERD, HLD, HTN, morbid obesity, history of right thalamic infarction, and CKD III. In the ED she was found to have orthostasis. Otherwise, RR is normal, oxygen saturations, and heart rate is normal. Labwork on presentation demonstrated low potassium at 3.2, creatinine of 1.59, elevated T bili at 1.9, elevated WBC at 13.3 and hemoglobin of 8.6. CT abdomen demonstrated a gallbladder full of sludge/stones. RUQ ultrasound was normal. [facility] were consulted to admit the patient for further evaluation and care.    Today, patient still reports dizziness, denies any further hematemesis/coffee-ground emesis, melena, abdominal pain, nausea/vomiting, fever/chills, chest pain, shortness of breath.     Assessment/Plan: Principal Problem:   Nausea and vomiting Active Problems:   HTN (hypertension)   Hyperlipidemia   Obesity (BMI 30.0-34.9)   CKD (chronic kidney disease) stage 3, GFR 30-59 ml/min   Acute gastric ulcer with hemorrhage   Acute blood loss anemia    Upper GI bleed likely 2/2 large gastric ulcer Intractable nausea/vomiting Noted to have about 400 cc of coffee-ground emesis on 04/05/2020 LFTs WNL except for T bili 1.9 on admission, currently T bili WNL Lipase WNL CT abdomen/pelvis showed gallbladder with mixture of biliary stones and sludge without features of acute cholecystitis Right upper quadrant ultrasound was normal, showed no gallstones or wall thickening GI consulted, status post emergent endoscopy on 04/05/2020 which showed giant gastric ulcer with active bleeding status post epi, coagulation, and hemostatic  clip and spray for hemostasis.  Noted small nonbleeding gastric ulcers, large volume dense clots in the proximal stomach.  Plan for repeat EGD on 04/08/20 If any recurrent hematemesis, urgent IR consult for embolization therapy Continue IV PPI, IV fluids Serial CBC checks Clear liquid diet Monitor closely  Acute blood loss anemia Likely from above Baseline hemoglobin around 12, dropped down to 7.1 on 04/05/2020 Anemia panel WNL Transfuse PRBC if hemoglobin less than 8, so far 4 units of PRBC transfused, last on 04/07/20 Serial CBC checks  AKI on CKD stage IIIa Improving Creatinine baseline around 1.3, on admission 1.59 Likely 2/2 above Continue IV fluids Daily BMP  Orthostatic hypotension Likely 2/2 GI bleed/poor oral intake IV fluids  Leukocytosis Possibly ??reactive UA showed large leukocytes, rare bacteria, greater than 50 WBC UC multiple species BC x2 NGTD Chest x-ray showed mild right basilar atelectasis Daily CBC  Hypertension Hold BP meds due to GI bleed  Obesity Lifestyle modification advised       Malnutrition Type:      Malnutrition Characteristics:      Nutrition Interventions:       Estimated body mass index is 32.85 kg/m as calculated from the following:   Height as of this encounter: 5\' 9"  (1.753 m).   Weight as of this encounter: 100.9 kg.     Code Status: Partial code  Family Communication: Discussed extensively with patient and brother at bedside on 04/06/20  Disposition Plan: Status is: Inpatient  Remains inpatient appropriate because:Inpatient level of care appropriate due to severity of illness   Dispo: The patient is from: Home              Anticipated d/c is to: SNF  Anticipated d/c date is: 2 days              Patient currently is not medically stable to d/c.    Consultants:  GI  Procedures:  EGD  Antimicrobials:  None  DVT prophylaxis: SCDs   Objective: Vitals:   04/07/20 0757 04/07/20  0830 04/07/20 1130 04/07/20 1607  BP: 101/69 (!) 113/49 (!) 116/100 (!) 125/63  Pulse:  73 72 71  Resp: 16 16 16 16   Temp: 98.9 F (37.2 C) 98.7 F (37.1 C) 98.7 F (37.1 C) 98.8 F (37.1 C)  TempSrc: Oral Oral Oral Oral  SpO2:    96%  Weight:      Height:        Intake/Output Summary (Last 24 hours) at 04/07/2020 1740 Last data filed at 04/07/2020 1607 Gross per 24 hour  Intake 2486.48 ml  Output 1725 ml  Net 761.48 ml   Filed Weights   04/05/20 1424 04/06/20 0535 04/07/20 0502  Weight: (!) 121 kg (!) 100 kg (!) 100.9 kg    Exam:  General: NAD, deconditioned, chronically ill-appearing  Cardiovascular: S1, S2 present  Respiratory: CTAB  Abdomen: Soft, generalized tenderness, nondistended, bowel sounds present  Musculoskeletal: No bilateral pedal edema noted  Skin: Normal  Psychiatry: Normal mood   Data Reviewed: CBC: Recent Labs  Lab 04/03/20 2354 04/03/20 2354 04/04/20 0442 04/04/20 0442 04/05/20 0612 04/05/20 1824 04/06/20 0130 04/06/20 1801 04/07/20 0039 04/07/20 0622 04/07/20 1312  WBC 13.3*  --  11.3*  --  8.4  --   --   --   --  9.1 9.6  NEUTROABS  --   --   --   --  5.6  --   --   --   --  5.4 6.0  HGB 8.6*   < > 8.6*   < > 7.1*   < > 6.1* 8.5* 7.6* 7.7* 9.4*  HCT 26.6*   < > 26.2*   < > 22.0*   < > 18.8* 25.9* 22.8* 23.5* 29.0*  MCV 95.0  --  95.3  --  96.5  --   --   --   --  89.4 89.8  PLT 245  --  255  --  242  --   --   --   --  163 165   < > = values in this interval not displayed.   Basic Metabolic Panel: Recent Labs  Lab 04/03/20 1207 04/03/20 1207 04/03/20 2354 04/04/20 0442 04/05/20 0612 04/06/20 0611 04/07/20 0622  NA 136  --   --  136 134* 138 138  K 3.2*  --   --  4.0 3.5 3.9 3.3*  CL 96*  --   --  101 102 107 107  CO2 21*  --   --  25 23 22 24   GLUCOSE 155*  --   --  108* 102* 119* 89  BUN 46*  --   --  44* 21 36* 28*  CREATININE 1.59*   < > 1.43* 1.29* 1.05* 1.16* 1.13*  CALCIUM 9.3  --   --  8.6* 8.4* 8.1* 7.9*     < > = values in this interval not displayed.   GFR: Estimated Creatinine Clearance: 56.9 mL/min (A) (by C-G formula based on SCr of 1.13 mg/dL (H)). Liver Function Tests: Recent Labs  Lab 04/03/20 1355 04/04/20 0442  AST 58* 43*  ALT 23 20  ALKPHOS 56 47  BILITOT 1.9* 0.6  PROT 8.0 6.3*  ALBUMIN 3.0* 2.5*   Recent Labs  Lab 04/03/20 1355  LIPASE 39   No results for input(s): AMMONIA in the last 168 hours. Coagulation Profile: No results for input(s): INR, PROTIME in the last 168 hours. Cardiac Enzymes: No results for input(s): CKTOTAL, CKMB, CKMBINDEX, TROPONINI in the last 168 hours. BNP (last 3 results) No results for input(s): PROBNP in the last 8760 hours. HbA1C: No results for input(s): HGBA1C in the last 72 hours. CBG: Recent Labs  Lab 04/03/20 1257  GLUCAP 157*   Lipid Profile: No results for input(s): CHOL, HDL, LDLCALC, TRIG, CHOLHDL, LDLDIRECT in the last 72 hours. Thyroid Function Tests: No results for input(s): TSH, T4TOTAL, FREET4, T3FREE, THYROIDAB in the last 72 hours. Anemia Panel: Recent Labs    04/05/20 0612  VITAMINB12 811  FOLATE 6.0  FERRITIN 245  TIBC 200*  IRON 57   Urine analysis:    Component Value Date/Time   COLORURINE YELLOW 04/04/2020 0807   APPEARANCEUR CLOUDY (A) 04/04/2020 0807   LABSPEC 1.027 04/04/2020 0807   PHURINE 6.0 04/04/2020 0807   GLUCOSEU NEGATIVE 04/04/2020 0807   HGBUR SMALL (A) 04/04/2020 0807   BILIRUBINUR NEGATIVE 04/04/2020 0807   KETONESUR 5 (A) 04/04/2020 0807   PROTEINUR 30 (A) 04/04/2020 0807   NITRITE NEGATIVE 04/04/2020 0807   LEUKOCYTESUR LARGE (A) 04/04/2020 0807   Sepsis Labs: @LABRCNTIP (procalcitonin:4,lacticidven:4)  ) Recent Results (from the past 240 hour(s))  SARS Coronavirus 2 by RT PCR (hospital order, performed in [facility] hospital lab) Nasopharyngeal Nasopharyngeal Swab     Status: None   Collection Time: 04/03/20 10:29 PM   Specimen: Nasopharyngeal Swab  Result Value Ref  Range Status   SARS Coronavirus 2 NEGATIVE NEGATIVE Final    Comment: (NOTE) SARS-CoV-2 target nucleic acids are NOT DETECTED.  The SARS-CoV-2 RNA is generally detectable in upper and lower respiratory specimens during the acute phase of infection. The lowest concentration of SARS-CoV-2 viral copies this assay can detect is 250 copies / mL. A negative result does not preclude SARS-CoV-2 infection and should not be used as the sole basis for treatment or other patient management decisions.  A negative result may occur with improper specimen collection / handling, submission of specimen other than nasopharyngeal swab, presence of viral mutation(s) within the areas targeted by this assay, and inadequate number of viral copies (<250 copies / mL). A negative result must be combined with clinical observations, patient history, and epidemiological information.  Fact Sheet for Patients:   04/05/20  Fact Sheet for Healthcare Providers: BoilerBrush.com.cy  This test is not yet approved or  cleared by the https://pope.com/ FDA and has been authorized for detection and/or diagnosis of SARS-CoV-2 by FDA under an Emergency Use Authorization (EUA).  This EUA will remain in effect (meaning this test can be used) for the duration of the COVID-19 declaration under Section 564(b)(1) of the Act, 21 U.S.C. section 360bbb-3(b)(1), unless the authorization is terminated or revoked sooner.  Performed at [facility], [address] [place], [place]   Culture, Urine     Status: Abnormal   Collection Time: 04/04/20  3:16 PM   Specimen: Urine, Random  Result Value Ref Range Status   Specimen Description URINE, RANDOM  Final   Special Requests   Final    NONE Performed at [facility], [address], [place], [place]    Culture (A)  Final    30,000 COLONIES/mL MULTIPLE SPECIES PRESENT, SUGGEST RECOLLECTION  Report  Status 04/05/2020 FINAL  Final  Culture, blood (routine x 2)     Status: None (Preliminary result)   Collection Time: 04/05/20  6:20 AM   Specimen: BLOOD  Result Value Ref Range Status   Specimen Description BLOOD RIGHT ANTECUBITAL  Final   Special Requests   Final    BOTTLES DRAWN AEROBIC AND ANAEROBIC Blood Culture adequate volume   Culture   Final    NO GROWTH 2 DAYS Performed at [facility], [address], [place], [place] [address]    Report Status PENDING  Incomplete  Culture, blood (routine x 2)     Status: None (Preliminary result)   Collection Time: 04/05/20  6:25 AM   Specimen: BLOOD RIGHT HAND  Result Value Ref Range Status   Specimen Description BLOOD RIGHT HAND  Final   Special Requests   Final    BOTTLES DRAWN AEROBIC AND ANAEROBIC Blood Culture adequate volume   Culture   Final    NO GROWTH 2 DAYS Performed at [facility], [address], [place], [place] [address]    Report Status PENDING  Incomplete      Studies: No results found.  Scheduled Meds: . sodium chloride   Intravenous Once  . metoCLOPramide (REGLAN) injection  10 mg Intravenous Q6H  . [START ON 04/09/2020] pantoprazole  40 mg Intravenous Q12H  . sodium chloride flush  3 mL Intravenous Once    Continuous Infusions: . sodium chloride 100 mL/hr at 04/07/20 0500  . pantoprozole (PROTONIX) infusion 8 mg/hr (04/07/20 0500)     LOS: 4 days     Briant Cedar, MD [facility]  If 7PM-7AM, please contact night-coverage [email or web address] 04/07/2020, 5:40 PM

## 2020-04-07 NOTE — H&P (View-Only) (Signed)
Daily Rounding Note  04/07/2020, 9:33 AM  LOS: 4 days   SUBJECTIVE:   Chief complaint: gastric ulcers, CGE, GI bleed.      No BM's for >48 hours.  Getting hungry.  No N/V.  No abd pain.  A little dizzy when she stands up.  Not walking about yet.  No SOB or chest pain.     BPsto 101/69.  No tachycardia.    OBJECTIVE:         Vital signs in last 24 hours:    Temp:  [98.6 F (37 C)-99.3 F (37.4 C)] 98.7 F (37.1 C) (07/27 0830) Pulse Rate:  [73-103] 73 (07/27 0830) Resp:  [14-16] 16 (07/27 0830) BP: (101-133)/(49-80) 113/49 (07/27 0830) SpO2:  [97 %-100 %] 100 % (07/27 0502) Weight:  [100.9 kg] 100.9 kg (07/27 0502) Last BM Date: 04/05/20 Filed Weights   04/05/20 1424 04/06/20 0535 04/07/20 0502  Weight: (!) 121 kg (!) 100 kg (!) 100.9 kg   General: looks well   Heart: RRR Chest: clear bil Abdomen: soft, NT, ND.  Active BS  Extremities: no CCE Neuro/Psych:  Alert.  Oriented x 3.  No deficits.  Fluid speech.    Intake/Output from previous day: 07/26 0701 - 07/27 0700 In: 3606.5 [P.O.:600; I.V.:2116.5; Blood:890] Out: 1100 [Urine:1100]  Intake/Output this shift: Total I/O In: 370 [I.V.:250; Blood:120] Out: 400 [Urine:400]  Lab Results: Recent Labs    04/05/20 0612 04/05/20 1824 04/06/20 1801 04/07/20 0039 04/07/20 0622  WBC 8.4  --   --   --  9.1  HGB 7.1*   < > 8.5* 7.6* 7.7*  HCT 22.0*   < > 25.9* 22.8* 23.5*  PLT 242  --   --   --  163   < > = values in this interval not displayed.   BMET Recent Labs    04/05/20 0612 04/06/20 0611 04/07/20 0622  NA 134* 138 138  K 3.5 3.9 3.3*  CL 102 107 107  CO2 23 22 24   GLUCOSE 102* 119* 89  BUN 21 36* 28*  CREATININE 1.05* 1.16* 1.13*  CALCIUM 8.4* 8.1* 7.9*   LFT No results for input(s): PROT, ALBUMIN, AST, ALT, ALKPHOS, BILITOT, BILIDIR, IBILI in the last 72 hours. PT/INR No results for input(s): LABPROT, INR in the last 72 hours. Hepatitis  Panel No results for input(s): HEPBSAG, HCVAB, HEPAIGM, HEPBIGM in the last 72 hours.  Studies/Results: No results found.  ASSESMENT:   *   GI bleed with CGE, 2 weeks N/V.  81 mg ASA at home. 04/06/11 EGD: Giant, oozing gastric ulcer w vv at [facility] treated with epinephrine, BiCAP cauterization, Hemoclip placement x 1, hemospray.  Smaller nonbleeding gastric ulcer.  Proximal stomach not visualized due to dense clots of blood.  Examined duodenum, normal esophagus. No previous colonoscopy pt always declines per note Day 2-3 Protonix drip. Serum H Pylori Ab positive.    *   Blood loss anemia. Hgb 8.6 > 7.1 >> 6.1 >> 2 PRBC >> 7.7  MCV 95.  S/p 2 PRBCs, 1 early AM 7/25, 1 on 7/26.   She is getting her 4th unit of PRBC currently.  Not clerly indicated given Hgb up to 7.7 after blood yesterday.    *    CVA 10/2019.  *    CKD 3   *   Hypokalemia.     PLAN   *   PPI drip finishes 7/28 and 12:30 AM.    *  EGD on 7/28.  relook to clear stomach, reasses ulcers.  reglan prior to assure stomach is empty.    *   Pylera or other abx regimen at discharge to treat H Pylori.       Susan Fernandez  04/07/2020, 9:33 AM Phone [phone]

## 2020-04-07 NOTE — TOC Progression Note (Signed)
Transition of Care [facility]) - Progression Note    Patient Details  Name: Susan Fernandez MRN: [id number] Date of Birth: 10-04-1945  Transition of Care [facility]) CM/SW Contact  Terrial Rhodes, LCSWA Phone Number: 04/07/2020, 12:46 PM  Clinical Narrative:     CSW started insurance authorization for patient. Insurance authorization reference number is [id number].   Pending bed offers. Pending insurance authorization.  CSW will continue to follow.   Expected Discharge Plan: Skilled Nursing Facility Barriers to Discharge: Continued Medical Work up  Expected Discharge Plan and Services Expected Discharge Plan: Skilled Nursing Facility       Living arrangements for the past 2 months: Single Family Home                                       Social Determinants of Health (SDOH) Interventions    Readmission Risk Interventions No flowsheet data found.

## 2020-04-07 NOTE — NC FL2 (Signed)
[place] MEDICAID FL2 LEVEL OF CARE SCREENING TOOL     IDENTIFICATION  Patient Name: Susan Fernandez Birthdate: 01-19-1946 Sex: female Admission Date (Current Location): 04/03/2020  [facility] and [place] Number:  Producer, television/film/video and Address:  [facility], [address] [place], [place] [address]      Provider Number: [id number]  Attending Physician Name and Address:  Briant Cedar, MD  Relative Name and Phone Number:  Karren Burly [phone]    Current Level of Care: Hospital Recommended Level of Care: Skilled Nursing Facility Prior Approval Number:    Date Approved/Denied:   PASRR Number: [id number]  Discharge Plan: SNF    Current Diagnoses: Patient Active Problem List   Diagnosis Date Noted  . Acute blood loss anemia   . Acute gastric ulcer with hemorrhage   . Nausea and vomiting 04/03/2020  . Posterior left knee pain 11/04/2019  . Right thalamic infarction (HCC) 11/03/2019  . Abnormal glucose 02/11/2019  . CKD (chronic kidney disease) stage 3, GFR 30-59 ml/min 06/21/2017  . Obesity (BMI 30.0-34.9) 06/20/2017  . HTN (hypertension) 11/03/2015  . Hyperlipidemia 11/03/2015  . Prediabetes 11/03/2015  . Vitamin D deficiency 11/03/2015  . Medication management 11/03/2015  . Combined form of senile cataract 10/29/2015    Orientation RESPIRATION BLADDER Height & Weight     Self, Time, Situation, Place  Normal Incontinent, External catheter (External Urinary Catheter) Weight: (!) 222 lb 7.1 oz (100.9 kg) Height:  5\' 9"  (175.3 cm)  BEHAVIORAL SYMPTOMS/MOOD NEUROLOGICAL BOWEL NUTRITION STATUS      Continent Diet (See Discharge Summary)  AMBULATORY STATUS COMMUNICATION OF NEEDS Skin   Limited Assist Verbally Normal (Appropriate for ethnicity,dry, intact)                       Personal Care Assistance Level of Assistance  Bathing, Feeding, Dressing Bathing Assistance: Independent Feeding assistance: Independent  (NPO) Dressing Assistance: Independent     Functional Limitations Info  Sight, Hearing, Speech Sight Info: Adequate Hearing Info: Adequate Speech Info: Adequate    SPECIAL CARE FACTORS FREQUENCY  PT (By licensed PT), OT (By licensed OT)     PT Frequency: 5x min weekly OT Frequency: 5x min weekly            Contractures Contractures Info: Not present    Additional Factors Info  Code Status, Allergies Code Status Info: Partial Allergies Info: Ppd (tuberculin Purified Protein Derivative),Sulfa Antibiotics,Tolectin           Current Medications (04/07/2020):  This is the current hospital active medication list Current Facility-Administered Medications  Medication Dose Route Frequency Provider Last Rate Last Admin  . 0.9 %  sodium chloride infusion (Manually program via Guardrails IV Fluids)   Intravenous Once 04/09/2020, MD      . 0.9 %  sodium chloride infusion   Intravenous Continuous Briant Cedar, MD 100 mL/hr at 04/07/20 0500 Rate Verify at 04/07/20 0500  . HYDROcodone-acetaminophen (NORCO/VICODIN) 5-325 MG per tablet 1 tablet  1 tablet Oral Q8H PRN 04/09/20, MD      . metoCLOPramide [facility]) injection 10 mg  10 mg Intravenous Q6H [place], [facility], PA-C      . ondansetron [facility]) tablet 4 mg  4 mg Oral Q6H PRN [facility], MD   4 mg at 04/04/20 0748   Or  . ondansetron (ZOFRAN) injection 4 mg  4 mg Intravenous Q6H PRN 04/06/20, MD   4  mg at 04/05/20 1920  . pantoprazole (PROTONIX) 80 mg in sodium chloride 0.9 % 100 mL (0.8 mg/mL) infusion  8 mg/hr Intravenous Continuous Hilarie Fredrickson, MD 10 mL/hr at 04/07/20 0500 8 mg/hr at 04/07/20 0500  . [START ON 04/09/2020] pantoprazole (PROTONIX) injection 40 mg  40 mg Intravenous Q12H Hilarie Fredrickson, MD      . phenol [facility]) mouth spray 1 spray  1 spray Mouth/Throat PRN [name], Ava, DO   1 spray at 04/05/20 2142  . promethazine (PHENERGAN) injection 12.5 mg  12.5 mg Intravenous Q8H PRN Hilarie Fredrickson, MD    12.5 mg at 04/05/20 1244  . sodium chloride flush (NS) 0.9 % injection 3 mL  3 mL Intravenous Once Hilarie Fredrickson, MD         Discharge Medications: Please see discharge summary for a list of discharge medications.  Relevant Imaging Results:  Relevant Lab Results:   Additional Information [id number]  Terrial Rhodes, LCSWA

## 2020-04-07 NOTE — Progress Notes (Signed)
° °       Daily Rounding Note ° °04/07/2020, 9:33 AM ° LOS: 4 days  ° °SUBJECTIVE:   °Chief complaint: gastric ulcers, CGE, GI bleed.      °No BM's for >48 hours.  Getting hungry.  No N/V.  No abd pain.  A little dizzy when she stands up.  Not walking about yet.  °No SOB or chest pain.     °BPsto 101/69.  No tachycardia.   ° °OBJECTIVE:        ° Vital signs in last 24 hours:    °Temp:  [98.6 °F (37 °C)-99.3 °F (37.4 °C)] 98.7 °F (37.1 °C) (07/27 0830) °Pulse Rate:  [73-103] 73 (07/27 0830) °Resp:  [14-16] 16 (07/27 0830) °BP: (101-133)/(49-80) 113/49 (07/27 0830) °SpO2:  [97 %-100 %] 100 % (07/27 0502) °Weight:  [100.9 kg] 100.9 kg (07/27 0502) °Last BM Date: 04/05/20 °Filed Weights  ° 04/05/20 1424 04/06/20 0535 04/07/20 0502  °Weight: (!) 121 kg (!) 100 kg (!) 100.9 kg  ° °General: looks well   °Heart: RRR °Chest: clear bil °Abdomen: soft, NT, ND.  Active BS  °Extremities: no CCE °Neuro/Psych:  Alert.  Oriented x 3.  No deficits.  Fluid speech.   ° °Intake/Output from previous day: °07/26 0701 - 07/27 0700 °In: 3606.5 [P.O.:600; I.V.:2116.5; Blood:890] °Out: 1100 [Urine:1100] ° °Intake/Output this shift: °Total I/O °In: 370 [I.V.:250; Blood:120] °Out: 400 [Urine:400] ° °Lab Results: °Recent Labs  °  04/05/20 °0612 04/05/20 °1824 04/06/20 °1801 04/07/20 °0039 04/07/20 °0622  °WBC 8.4  --   --   --  9.1  °HGB 7.1*   < > 8.5* 7.6* 7.7*  °HCT 22.0*   < > 25.9* 22.8* 23.5*  °PLT 242  --   --   --  163  ° < > = values in this interval not displayed.  ° °BMET °Recent Labs  °  04/05/20 °0612 04/06/20 °0611 04/07/20 °0622  °NA 134* 138 138  °K 3.5 3.9 3.3*  °CL 102 107 107  °CO2 23 22 24  °GLUCOSE 102* 119* 89  °BUN 21 36* 28*  °CREATININE 1.05* 1.16* 1.13*  °CALCIUM 8.4* 8.1* 7.9*  ° °LFT °No results for input(s): PROT, ALBUMIN, AST, ALT, ALKPHOS, BILITOT, BILIDIR, IBILI in the last 72 hours. °PT/INR °No results for input(s): LABPROT, INR in the last 72 hours. °Hepatitis  Panel °No results for input(s): HEPBSAG, HCVAB, HEPAIGM, HEPBIGM in the last 72 hours. ° °Studies/Results: °No results found. ° °ASSESMENT:  ° °*   GI bleed with CGE, 2 weeks N/V.  81 mg ASA at home. °04/06/11 EGD: Giant, oozing gastric ulcer w vv at [facility] treated with epinephrine, BiCAP cauterization, Hemoclip placement x 1, hemospray.  Smaller nonbleeding gastric ulcer.  Proximal stomach not visualized due to dense clots of blood.  Examined duodenum, normal esophagus. °No previous colonoscopy pt always declines per note °Day 2-3 Protonix drip. °Serum H Pylori Ab positive.   °  °*   Blood loss anemia. °Hgb 8.6 > 7.1 >> 6.1 >> 2 PRBC >> 7.7  MCV 95.  S/p 2 PRBCs, 1 early AM 7/25, 1 on 7/26.   °She is getting her 4th unit of PRBC currently.  Not clerly indicated given Hgb up to 7.7 after blood yesterday.   °  °*    CVA 10/2019. °  °*    CKD 3  ° °*   Hypokalemia.   ° ° °PLAN  ° °*   PPI drip finishes 7/28 and 12:30 AM.   ° °*     EGD on 7/28.  relook to clear stomach, reasses ulcers.  reglan prior to assure stomach is empty.    *   Pylera or other abx regimen at discharge to treat H Pylori.       Jennye Moccasin  04/07/2020, 9:33 AM Phone [phone]

## 2020-04-07 NOTE — Progress Notes (Signed)
Occupational Therapy Treatment Patient Details Name: Susan Fernandez MRN: [id number] DOB: 06/20/1946 Today's Date: 04/07/2020    History of present illness Pt is a 74 yr old woman who presentes with complaints of weakness, nausea and vomiting. She states that this has been ongoing for 2 weeks. Pt additionally found to be anemic and with GIB due to large ulcers.  Pt's hgb was 6.1 this am but she has received 2 units PRBC.  Past medical history significant for pre-diabetes, Venous insufficiency, GERD, Hyperlipidemia, Hypertension, morbid obesity, history of right thalamic infarction Feb 2021, and CKD III.   OT comments  Pt progressing slowly. Session focused on EOB ADL tasks and taking steps to [facility]. Pt limited by decreased strength, decreased activity tolerance, decreased mobility and decreased ability to care for self; pt with significant fear of falling. Pt ModA overall for sit to stand due to numbness in BLEs with RW for stability. Pt would benefit from continued OT skilled services. OT following acutely.    Follow Up Recommendations  SNF;Supervision - Intermittent    Equipment Recommendations  3 in 1 bedside commode    Recommendations for Other Services      Precautions / Restrictions Precautions Precautions: Fall Precaution Comments: dizziness       Mobility Bed Mobility Overal bed mobility: Needs Assistance Bed Mobility: Supine to Sit;Sit to Supine     Supine to sit: Min guard Sit to supine: Min guard   General bed mobility comments: Cues to scoot to EOB  Transfers Overall transfer level: Needs assistance Equipment used: Rolling walker (2 wheeled) Transfers: Sit to/from Stand Sit to Stand: Mod assist         General transfer comment: ModA overall for sit to stand due to numbness in BLEs.    Balance Overall balance assessment: Needs assistance Sitting-balance support: No upper extremity supported;Feet supported Sitting balance-Leahy Scale: Fair      Standing balance support: Bilateral upper extremity supported Standing balance-Leahy Scale: Poor Standing balance comment: reliant on RW                           ADL either performed or assessed with clinical judgement   ADL Overall ADL's : Needs assistance/impaired Eating/Feeding: Supervision/ safety;Bed level Eating/Feeding Details (indicate cue type and reason): Pt excited stating "I got to have broth today." Grooming: Set up;Sitting Grooming Details (indicate cue type and reason): Pt refusing to perform OOB ADL; set-upA only                             Functional mobility during ADLs: Minimal assistance;Rolling walker;Cueing for safety General ADL Comments: Session focused on EOB ADL tasks and taking steps to [facility]. Pt limited by decreased strength, decreased activity tolerance, decreased mobility and decreased ability to care for self; pt with significant fear of falling     Vision       Perception     Praxis      Cognition Arousal/Alertness: Awake/alert Behavior During Therapy: Anxious Overall Cognitive Status: Within Functional Limits for tasks assessed                                 General Comments: Pt appears to be self limiting        Exercises     Shoulder Instructions       General Comments VSS  Pertinent Vitals/ Pain       Pain Assessment: Faces Pain Score: 0-No pain Pain Intervention(s): Limited activity within patient's tolerance  Home Living                                          Prior Functioning/Environment              Frequency  Min 2X/week        Progress Toward Goals  OT Goals(current goals can now be found in the care plan section)  Progress towards OT goals: Progressing toward goals  Acute Rehab OT Goals Patient Stated Goal: to go home OT Goal Formulation: With patient Time For Goal Achievement: 04/20/20 Potential to Achieve Goals: Fair ADL Goals Pt Will  Perform Grooming: with supervision;standing Pt Will Perform Lower Body Dressing: sitting/lateral leans;sit to/from stand;with supervision Pt Will Transfer to Toilet: ambulating;bedside commode;with supervision Pt Will Perform Toileting - Clothing Manipulation and hygiene: with supervision;sitting/lateral leans;sit to/from stand Pt/caregiver will Perform Home Exercise Program: Increased strength;With written HEP provided Additional ADL Goal #1: Pt will increase to x5 mins standing for OOB ADL tasks with good safety awareness.  Plan Discharge plan remains appropriate    Co-evaluation                 AM-PAC OT "6 Clicks" Daily Activity     Outcome Measure   Help from another person eating meals?: None Help from another person taking care of personal grooming?: A Little Help from another person toileting, which includes using toliet, bedpan, or urinal?: A Lot Help from another person bathing (including washing, rinsing, drying)?: A Lot Help from another person to put on and taking off regular upper body clothing?: A Little Help from another person to put on and taking off regular lower body clothing?: A Lot 6 Click Score: 16    End of Session Equipment Utilized During Treatment: Gait belt;Rolling walker  OT Visit Diagnosis: Unsteadiness on feet (R26.81);Muscle weakness (generalized) (M62.81)   Activity Tolerance Patient limited by pain;Patient tolerated treatment well   Patient Left in bed;with call bell/phone within reach;with bed alarm set   Nurse Communication Mobility status        Time: 1430-1454 OT Time Calculation (min): 24 min  Charges: OT General Charges $OT Visit: 1 Visit OT Treatments $Self Care/Home Management : 8-22 mins $Therapeutic Activity: 8-22 mins  Flora Lipps, OTR/L Acute Rehabilitation Services Pager: [phone] Office: [phone]    Marysol Wellnitz C 04/07/2020, 4:49 PM

## 2020-04-07 NOTE — TOC Initial Note (Addendum)
Transition of Care [facility]) - Initial/Assessment Note    Patient Details  Name: Susan Fernandez MRN: [id number] Date of Birth: [birth year]  Transition of Care [facility]) CM/SW Contact:    Terrial Rhodes, LCSWA Phone Number: 04/07/2020, 12:05 PM  Clinical Narrative:                  CSW spoke with patient at bedside. Patient was agreeable to SNF. Patient agreed for CSW to fax out initial referral near [place] area. Patients first choice would be [facility] for SNF placement. Patient gave CSW permission to discuss her care with her niece [place].  Pending bed offers. CSW will start insurance authorization for patient.  CSW will continue to follow.  Expected Discharge Plan: Skilled Nursing Facility Barriers to Discharge: Continued Medical Work up   Patient Goals and CMS Choice Patient states their goals for this hospitalization and ongoing recovery are:: to go to SNF CMS Medicare.gov Compare Post Acute Care list provided to:: Patient Choice offered to / list presented to : Patient  Expected Discharge Plan and Services Expected Discharge Plan: Skilled Nursing Facility       Living arrangements for the past 2 months: Single Family Home                                      Prior Living Arrangements/Services Living arrangements for the past 2 months: Single Family Home Lives with:: Self Patient language and need for interpreter reviewed:: Yes Do you feel safe going back to the place where you live?: No   SNF  Need for Family Participation in Patient Care: Yes (Comment) Care giver support system in place?: Yes (comment)   Criminal Activity/Legal Involvement Pertinent to Current Situation/Hospitalization: No - Comment as needed  Activities of Daily Living Home Assistive Devices/Equipment: None ADL Screening (condition at time of admission) Patient's cognitive ability adequate to safely complete daily activities?: Yes Is the patient deaf or have  difficulty hearing?: No Does the patient have difficulty seeing, even when wearing glasses/contacts?: No Does the patient have difficulty concentrating, remembering, or making decisions?: No Patient able to express need for assistance with ADLs?: Yes Does the patient have difficulty dressing or bathing?: No Independently performs ADLs?: No Communication: Independent Does the patient have difficulty walking or climbing stairs?: No Weakness of Legs: None Weakness of Arms/Hands: None  Permission Sought/Granted Permission sought to share information with : Case Manager, Family Supports, Oceanographer granted to share information with : Yes, Verbal Permission Granted  Share Information with NAME: Karren Burly  Permission granted to share info w AGENCY: SNF  Permission granted to share info w Relationship: Brother  Permission granted to share info w Contact Information: Karren Burly [phone]  Emotional Assessment Appearance:: Appears stated age Attitude/Demeanor/Rapport: Gracious Affect (typically observed): Calm Orientation: : Oriented to Self, Oriented to Place, Oriented to  Time, Oriented to Situation Alcohol / Substance Use: Not Applicable Psych Involvement: No (comment)  Admission diagnosis:  Hypokalemia [E87.6] Leukocytosis [D72.829] Epigastric abdominal pain [R10.13] Elevated LFTs [R79.89] Nausea and vomiting [R11.2] Orthostatic dizziness [R42] Acute renal failure, unspecified acute renal failure type [facility]) [N17.9] Patient Active Problem List   Diagnosis Date Noted  . Acute blood loss anemia   . Acute gastric ulcer with hemorrhage   . Nausea and vomiting 04/03/2020  . Posterior left knee pain 11/04/2019  . Right thalamic infarction (HCC) 11/03/2019  . Abnormal glucose 02/11/2019  .  CKD (chronic kidney disease) stage 3, GFR 30-59 ml/min 06/21/2017  . Obesity (BMI 30.0-34.9) 06/20/2017  . HTN (hypertension) 11/03/2015  . Hyperlipidemia 11/03/2015  .  Prediabetes 11/03/2015  . Vitamin D deficiency 11/03/2015  . Medication management 11/03/2015  . Combined form of senile cataract 10/29/2015   PCP:  [name], MD Pharmacy:   [facility] [address], [place] - [address] [place] [address] Phone: [phone] Fax: [phone]     Social Determinants of Health (SDOH) Interventions    Readmission Risk Interventions No flowsheet data found.

## 2020-04-08 ENCOUNTER — Inpatient Hospital Stay (HOSPITAL_COMMUNITY): Payer: Medicare PPO | Admitting: Anesthesiology

## 2020-04-08 ENCOUNTER — Encounter (HOSPITAL_COMMUNITY)
Admission: EM | Disposition: A | Discharge status: Skilled Nursing Facility | Payer: Self-pay | Source: Home / Self Care | Attending: Internal Medicine

## 2020-04-08 ENCOUNTER — Encounter (HOSPITAL_COMMUNITY): Payer: Self-pay | Admitting: Internal Medicine

## 2020-04-08 DIAGNOSIS — K259 Gastric ulcer, unspecified as acute or chronic, without hemorrhage or perforation: Secondary | ICD-10-CM

## 2020-04-08 DIAGNOSIS — K2970 Gastritis, unspecified, without bleeding: Secondary | ICD-10-CM

## 2020-04-08 DIAGNOSIS — K254 Chronic or unspecified gastric ulcer with hemorrhage: Secondary | ICD-10-CM | POA: Diagnosis not present

## 2020-04-08 HISTORY — PX: ESOPHAGOGASTRODUODENOSCOPY (EGD) WITH PROPOFOL: SHX5813

## 2020-04-08 LAB — TYPE AND SCREEN
ABO/RH(D): A POS
Antibody Screen: NEGATIVE
Unit division: 0
Unit division: 0
Unit division: 0
Unit division: 0

## 2020-04-08 LAB — BPAM RBC
Blood Product Expiration Date: 202108202359
Blood Product Expiration Date: 202108202359
Blood Product Expiration Date: 202108212359
Blood Product Expiration Date: 202108212359
ISSUE DATE / TIME: 202107251213
ISSUE DATE / TIME: 202107260610
ISSUE DATE / TIME: 202107261059
ISSUE DATE / TIME: 202107270751
Unit Type and Rh: 6200
Unit Type and Rh: 6200
Unit Type and Rh: 6200
Unit Type and Rh: 6200

## 2020-04-08 LAB — CBC WITH DIFFERENTIAL/PLATELET
Abs Immature Granulocytes: 0.04 10*3/uL (ref 0.00–0.07)
Basophils Absolute: 0 10*3/uL (ref 0.0–0.1)
Basophils Relative: 0 %
Eosinophils Absolute: 0.1 10*3/uL (ref 0.0–0.5)
Eosinophils Relative: 1 %
HCT: 29.2 % — ABNORMAL LOW (ref 36.0–46.0)
Hemoglobin: 9.6 g/dL — ABNORMAL LOW (ref 12.0–15.0)
Immature Granulocytes: 1 %
Lymphocytes Relative: 22 %
Lymphs Abs: 1.6 10*3/uL (ref 0.7–4.0)
MCH: 29.8 pg (ref 26.0–34.0)
MCHC: 32.9 g/dL (ref 30.0–36.0)
MCV: 90.7 fL (ref 80.0–100.0)
Monocytes Absolute: 0.5 10*3/uL (ref 0.1–1.0)
Monocytes Relative: 7 %
Neutro Abs: 5.1 10*3/uL (ref 1.7–7.7)
Neutrophils Relative %: 69 %
Platelets: 178 10*3/uL (ref 150–400)
RBC: 3.22 MIL/uL — ABNORMAL LOW (ref 3.87–5.11)
RDW: 16.9 % — ABNORMAL HIGH (ref 11.5–15.5)
WBC: 7.3 10*3/uL (ref 4.0–10.5)
nRBC: 0.7 % — ABNORMAL HIGH (ref 0.0–0.2)

## 2020-04-08 LAB — BASIC METABOLIC PANEL
Anion gap: 7 (ref 5–15)
BUN: 14 mg/dL (ref 8–23)
CO2: 22 mmol/L (ref 22–32)
Calcium: 8.2 mg/dL — ABNORMAL LOW (ref 8.9–10.3)
Chloride: 110 mmol/L (ref 98–111)
Creatinine, Ser: 1.02 mg/dL — ABNORMAL HIGH (ref 0.44–1.00)
GFR calc Af Amer: >60 mL/min (ref >60)
GFR calc non Af Amer: 55 mL/min — ABNORMAL LOW (ref >60)
Glucose, Bld: 120 mg/dL — ABNORMAL HIGH (ref 70–99)
Potassium: 3.2 mmol/L — ABNORMAL LOW (ref 3.5–5.1)
Sodium: 139 mmol/L (ref 135–145)

## 2020-04-08 LAB — GLUCOSE, CAPILLARY: Glucose-Capillary: 105 mg/dL — ABNORMAL HIGH (ref 70–99)

## 2020-04-08 SURGERY — ESOPHAGOGASTRODUODENOSCOPY (EGD) WITH PROPOFOL
Anesthesia: Monitor Anesthesia Care

## 2020-04-08 MED ORDER — DOXYCYCLINE HYCLATE 100 MG PO TABS
100.0000 mg | ORAL_TABLET | Freq: Two times a day (BID) | ORAL | Status: DC
  Administered 2020-04-08 – 2020-04-10 (×5): 100 mg via ORAL
  Filled 2020-04-08 (×5): qty 1

## 2020-04-08 MED ORDER — PROPOFOL 500 MG/50ML IV EMUL
INTRAVENOUS | Status: DC | PRN
  Administered 2020-04-08: 150 ug/kg/min via INTRAVENOUS

## 2020-04-08 MED ORDER — LIDOCAINE 2% (20 MG/ML) 5 ML SYRINGE
INTRAMUSCULAR | Status: DC | PRN
Start: 2020-04-08 — End: 2020-04-08
  Administered 2020-04-08: 40 mg via INTRAVENOUS

## 2020-04-08 MED ORDER — PROPOFOL 10 MG/ML IV BOLUS
INTRAVENOUS | Status: DC | PRN
  Administered 2020-04-08 (×2): 20 mg via INTRAVENOUS

## 2020-04-08 MED ORDER — BISMUTH SUBSALICYLATE 262 MG PO CHEW
524.0000 mg | CHEWABLE_TABLET | Freq: Four times a day (QID) | ORAL | Status: DC
  Administered 2020-04-08 – 2020-04-10 (×10): 524 mg via ORAL
  Filled 2020-04-08 (×12): qty 2

## 2020-04-08 MED ORDER — SODIUM CHLORIDE 0.9 % IV SOLN: INTRAVENOUS | Status: DC | PRN

## 2020-04-08 MED ORDER — METRONIDAZOLE 500 MG PO TABS
250.0000 mg | ORAL_TABLET | Freq: Four times a day (QID) | ORAL | Status: DC
  Administered 2020-04-08 – 2020-04-10 (×10): 250 mg via ORAL
  Filled 2020-04-08 (×10): qty 1

## 2020-04-08 MED ORDER — SUCRALFATE 1 GM/10ML PO SUSP
1.0000 g | Freq: Three times a day (TID) | ORAL | Status: DC
  Administered 2020-04-08 – 2020-04-10 (×10): 1 g via ORAL
  Filled 2020-04-08 (×10): qty 10

## 2020-04-08 SURGICAL SUPPLY — 15 items

## 2020-04-08 NOTE — Progress Notes (Signed)
PT Cancellation Note  Patient Details Name: Susan Fernandez MRN: [id number] DOB: [birth year]   Cancelled Treatment:    Reason Eval/Treat Not Completed: Patient at procedure or test/unavailable (Pt in Endoscopy. Will return as able.  )   [name] 04/08/2020, 8:53 AM  [name],PT Acute Rehabilitation Services Pager:  [phone]  Office:  [phone]

## 2020-04-08 NOTE — Transfer of Care (Signed)
Immediate Anesthesia Transfer of Care Note  Patient: Susan Fernandez  Procedure(s) Performed: ESOPHAGOGASTRODUODENOSCOPY (EGD) WITH PROPOFOL (N/A )  Patient Location: PACU  Anesthesia Type:MAC  Level of Consciousness: awake and patient cooperative  Airway & Oxygen Therapy: Patient Spontanous Breathing  Post-op Assessment: Report given to RN and Post -op Vital signs reviewed and stable  Post vital signs: Reviewed and stable  Last Vitals:  Vitals Value Taken Time  BP 137/58 04/08/20 0909  Temp 37.1 C 04/08/20 0909  Pulse 72 04/08/20 0911  Resp 24 04/08/20 0911  SpO2 96 % 04/08/20 0911  Vitals shown include unvalidated device data.  Last Pain:  Vitals:   04/08/20 0909  TempSrc: Axillary  PainSc:       Patients Stated Pain Goal: 0 (43/56/86 1683)  Complications: No complications documented.

## 2020-04-08 NOTE — Anesthesia Postprocedure Evaluation (Signed)
Anesthesia Post Note  Patient: Susan Fernandez  Procedure(s) Performed: ESOPHAGOGASTRODUODENOSCOPY (EGD) WITH PROPOFOL (N/A )     Patient location during evaluation: Endoscopy Anesthesia Type: MAC Level of consciousness: awake and alert Pain management: pain level controlled Vital Signs Assessment: post-procedure vital signs reviewed and stable Respiratory status: spontaneous breathing, nonlabored ventilation and respiratory function stable Cardiovascular status: stable and blood pressure returned to baseline Postop Assessment: no apparent nausea or vomiting Anesthetic complications: no   No complications documented.  Last Vitals:  Vitals:   04/08/20 0910 04/08/20 0919  BP:  (!) 145/65  Pulse: 92 70  Resp: 23 22  Temp:    SpO2: 96% 98%    Last Pain:  Vitals:   04/08/20 0919  TempSrc:   PainSc: 0-No pain                 Jeancarlo Leffler,W. EDMOND

## 2020-04-08 NOTE — Progress Notes (Signed)
Patient ID: Susan Fernandez, female   DOB: Jan 07, 1946, 73 y.o.   MRN: [id number]  PROGRESS NOTE    Susan Fernandez  EVO:[id number] DOB: 02-16-1946 DOA: 04/03/2020 PCP: [name], MD   Brief Narrative:  74 year old female with history of prediabetes, venous insufficiency, GERD, hyperlipidemia, hypertension, obesity, right thalamic infarction, CKD 3 presented with worsening weakness, nausea and vomiting for 2 weeks.  In the ED, she was found to be orthostatic with hemoglobin of 8.6.  CT of the abdomen showed a gallbladder full of sludge/stones.  Right upper quadrant ultrasound was normal.  GI was consulted.  She was found to have coffee-ground emesis on 04/05/2020.  She underwent EGD on 04/05/2020 which showed large gastric ulcer.  She underwent repeat EGD on 04/08/2020.  Assessment & Plan:   Upper GI bleed secondary to large gastric ulcer Intractable nausea and vomiting -Noted to have about 400 cc of coffee-ground emesis on 04/05/2020 -LFTs normal except for T bili of 1.9 on admission.  Currently T bili within normal limits.  Lipase within normal limits -CT abdomen/pelvis showed gallbladder with mixture of biliary stones and sludge without features of acute cholecystitis -Right upper quadrant ultrasound was normal, showed no gallstones or wall thickening -GI consulted, status post emergent endoscopy on 04/05/2020 which showed giant gastric ulcer with active bleeding status post epi, coagulation, and hemostatic clip and spray for hemostasis.  Noted small nonbleeding gastric ulcers, large volume dense clots in the proximal stomach.   -Status post repeat EGD today which showed large ulcer with adherent clot.  Continue PPI and sucralfate along with quadruple therapy for 14 days.  Advance diet as tolerated -Monitor H&H -If patient has recurrent hematemesis, urgent IR consult for embolizing therapy as per GI  Acute blood loss anemia -Baseline hemoglobin around 12, dropped down to 7.1.   Hemoglobin 9.6 today.  Status post 4 units packed red cells transfusion so far during the hospitalization  AKI on chronic kidney disease stage IIIa -Baseline creatinine 1.3; on admission 1.59.  1.02 today.  Orthostatic hypotension History of hypertension -Probably from GI bleed.  Improved -BP meds on hold  Leukocytosis -Probably reactive resolved  Obesity -Outpatient follow-up  Generalized deconditioning -PT recommends SNF placement.  Social worker following  DVT prophylaxis: SCDs Code Status: Full Family Communication: None at bedside Disposition Plan: Status is: Inpatient  Remains inpatient appropriate because:Inpatient level of care appropriate due to severity of illness   Dispo:  Patient From: Home  Planned Disposition: Skilled Nursing Facility  Expected discharge date: 04/09/20  Medically stable for discharge: No   Consultants: GI Procedures: emergent endoscopy on 04/05/2020 which showed giant gastric ulcer with active bleeding status post epi, coagulation, and hemostatic clip and spray for hemostasis.  Noted small nonbleeding gastric ulcers, large volume dense clots in the proximal stomach.   Repeat EGD on 04/08/2020 Impression:               - Normal esophagus.                           - Non-bleeding gastric ulcers with no stigmata of                            bleeding.                           - Large ulcer with adherent clot overlying the  previously treated visible vessel. While there was                            no active bleeding, the size and appearance of the                            ulcer are still at elevated risk of future rebleed.                            Will plan to continue treating with high dose PPI                            and adding Carafate as below. In the event of a                            rebleed, there is a clip at the ulcer edge that can                            be used as a radiographic target,  and would consult                            IR for embolization. Otherwise, will continue with                            medical management.                           - Gastritis.                           - Normal first portion of the duodenum and second                            portion of the duodenum.                           - Erythematous duodenopathy.                           - No specimens collected. Recommendation:           - Return patient to hospital ward for ongoing care.                           - Full liquid diet now, and advance as tolerated to                            previous diet.                           - Continue present medications.                           - Use Protonix (pantoprazole) 40 mg PO BID for 8  weeks.                           - Use sucralfate suspension 1 gram PO QID for 4                            weeks.                           - Repeat upper endoscopy in 8 weeks to check                            healing.                           - Return to GI clinic at appointment to be                            scheduled.                           - Continue avoiding NSAIDs.                           - Continue H pylori antimicrobial therapy                            (quadruple therapy x14 days total).                           - Repeat CBC 7-10 days after hospital discharge.                           - Please do not hesitate to contact the GI service                            with additional questions or concerns.  Antimicrobials:  Anti-infectives (From admission, onward)   Start     Dose/Rate Route Frequency Ordered Stop   04/08/20 1200  metroNIDAZOLE (FLAGYL) tablet 250 mg     Discontinue     250 mg Oral Every 6 hours 04/08/20 0958     04/08/20 1000  doxycycline (VIBRA-TABS) tablet 100 mg     Discontinue     100 mg Oral Every 12 hours 04/08/20 0958         Subjective: Patient seen and examined at bedside.   Denies worsening abdominal pain, nausea, vomiting, fever  Objective: Vitals:   04/08/20 0909 04/08/20 0910 04/08/20 0919 04/08/20 1124  BP: (!) 137/58  (!) 145/65 120/66  Pulse: 74 92 70 72  Resp: 18 23 22 20   Temp: 98.8 F (37.1 C)   98.9 F (37.2 C)  TempSrc: Axillary   Oral  SpO2: 92% 96% 98% 100%  Weight:      Height:        Intake/Output Summary (Last 24 hours) at 04/08/2020 1431 Last data filed at 04/08/2020 1025 Gross per 24 hour  Intake 2599.93 ml  Output 225 ml  Net 2374.93 ml   Filed Weights   04/07/20 0502 04/08/20 0534 04/08/20 04/10/20  Weight: (!) 100.9 kg (!) 100.4 kg (!) 100.4 kg    Examination:  General exam: Appears calm and comfortable  Respiratory system: Bilateral decreased breath sounds at bases Cardiovascular system: S1 & S2 heard, Rate controlled Gastrointestinal system: Abdomen is nondistended, soft and nontender. Normal bowel sounds heard. Extremities: No cyanosis, clubbing; trace lower extremity edema   Data Reviewed: I have personally reviewed following labs and imaging studies  CBC: Recent Labs  Lab 04/04/20 0442 04/04/20 0442 04/05/20 0612 04/05/20 1824 04/06/20 1801 04/07/20 0039 04/07/20 0622 04/07/20 1312 04/08/20 1133  WBC 11.3*  --  8.4  --   --   --  9.1 9.6 7.3  NEUTROABS  --   --  5.6  --   --   --  5.4 6.0 5.1  HGB 8.6*   < > 7.1*   < > 8.5* 7.6* 7.7* 9.4* 9.6*  HCT 26.2*   < > 22.0*   < > 25.9* 22.8* 23.5* 29.0* 29.2*  MCV 95.3  --  96.5  --   --   --  89.4 89.8 90.7  PLT 255  --  242  --   --   --  163 165 178   < > = values in this interval not displayed.   Basic Metabolic Panel: Recent Labs  Lab 04/04/20 0442 04/05/20 0612 04/06/20 0611 04/07/20 0622 04/08/20 1133  NA 136 134* 138 138 139  K 4.0 3.5 3.9 3.3* 3.2*  CL 101 102 107 107 110  CO2 25 23 22 24 22   GLUCOSE 108* 102* 119* 89 120*  BUN 44* 21 36* 28* 14  CREATININE 1.29* 1.05* 1.16* 1.13* 1.02*  CALCIUM 8.6* 8.4* 8.1* 7.9* 8.2*   GFR: Estimated  Creatinine Clearance: 62.9 mL/min (A) (by C-G formula based on SCr of 1.02 mg/dL (H)). Liver Function Tests: Recent Labs  Lab 04/03/20 1355 04/04/20 0442  AST 58* 43*  ALT 23 20  ALKPHOS 56 47  BILITOT 1.9* 0.6  PROT 8.0 6.3*  ALBUMIN 3.0* 2.5*   Recent Labs  Lab 04/03/20 1355  LIPASE 39   No results for input(s): AMMONIA in the last 168 hours. Coagulation Profile: No results for input(s): INR, PROTIME in the last 168 hours. Cardiac Enzymes: No results for input(s): CKTOTAL, CKMB, CKMBINDEX, TROPONINI in the last 168 hours. BNP (last 3 results) No results for input(s): PROBNP in the last 8760 hours. HbA1C: No results for input(s): HGBA1C in the last 72 hours. CBG: Recent Labs  Lab 04/03/20 1257  GLUCAP 157*   Lipid Profile: No results for input(s): CHOL, HDL, LDLCALC, TRIG, CHOLHDL, LDLDIRECT in the last 72 hours. Thyroid Function Tests: No results for input(s): TSH, T4TOTAL, FREET4, T3FREE, THYROIDAB in the last 72 hours. Anemia Panel: No results for input(s): VITAMINB12, FOLATE, FERRITIN, TIBC, IRON, RETICCTPCT in the last 72 hours. Sepsis Labs: No results for input(s): PROCALCITON, LATICACIDVEN in the last 168 hours.  Recent Results (from the past 240 hour(s))  SARS Coronavirus 2 by RT PCR (hospital order, performed in [facility] hospital lab) Nasopharyngeal Nasopharyngeal Swab     Status: None   Collection Time: 04/03/20 10:29 PM   Specimen: Nasopharyngeal Swab  Result Value Ref Range Status   SARS Coronavirus 2 NEGATIVE NEGATIVE Final    Comment: (NOTE) SARS-CoV-2 target nucleic acids are NOT DETECTED.  The SARS-CoV-2 RNA is generally detectable in upper and lower respiratory specimens during the acute phase of infection. The lowest concentration of SARS-CoV-2 viral copies this assay can detect  is 250 copies / mL. A negative result does not preclude SARS-CoV-2 infection and should not be used as the sole basis for treatment or other patient management  decisions.  A negative result may occur with improper specimen collection / handling, submission of specimen other than nasopharyngeal swab, presence of viral mutation(s) within the areas targeted by this assay, and inadequate number of viral copies (<250 copies / mL). A negative result must be combined with clinical observations, patient history, and epidemiological information.  Fact Sheet for Patients:   BoilerBrush.com.cy  Fact Sheet for Healthcare Providers: https://pope.com/  This test is not yet approved or  cleared by the [place] FDA and has been authorized for detection and/or diagnosis of SARS-CoV-2 by FDA under an Emergency Use Authorization (EUA).  This EUA will remain in effect (meaning this test can be used) for the duration of the COVID-19 declaration under Section 564(b)(1) of the Act, 21 U.S.C. section 360bbb-3(b)(1), unless the authorization is terminated or revoked sooner.  Performed at [facility], [address] [place], [place] [address]   Culture, Urine     Status: Abnormal   Collection Time: 04/04/20  3:16 PM   Specimen: Urine, Random  Result Value Ref Range Status   Specimen Description URINE, RANDOM  Final   Special Requests   Final    NONE Performed at [facility], [address], [place], [place] [address]    Culture (A)  Final    30,000 COLONIES/mL MULTIPLE SPECIES PRESENT, SUGGEST RECOLLECTION   Report Status 04/05/2020 FINAL  Final  Culture, blood (routine x 2)     Status: None (Preliminary result)   Collection Time: 04/05/20  6:20 AM   Specimen: BLOOD  Result Value Ref Range Status   Specimen Description BLOOD RIGHT ANTECUBITAL  Final   Special Requests   Final    BOTTLES DRAWN AEROBIC AND ANAEROBIC Blood Culture adequate volume   Culture   Final    NO GROWTH 2 DAYS Performed at [facility], [address], [place], [place] [address]    Report Status PENDING   Incomplete  Culture, blood (routine x 2)     Status: None (Preliminary result)   Collection Time: 04/05/20  6:25 AM   Specimen: BLOOD RIGHT HAND  Result Value Ref Range Status   Specimen Description BLOOD RIGHT HAND  Final   Special Requests   Final    BOTTLES DRAWN AEROBIC AND ANAEROBIC Blood Culture adequate volume   Culture   Final    NO GROWTH 2 DAYS Performed at [facility], [address], [place], [place] [address]    Report Status PENDING  Incomplete         Radiology Studies: No results found.      Scheduled Meds: . sodium chloride   Intravenous Once  . bismuth subsalicylate  524 mg Oral QID  . doxycycline  100 mg Oral Q12H  . metroNIDAZOLE  250 mg Oral Q6H  . [START ON 04/09/2020] pantoprazole  40 mg Intravenous Q12H  . sucralfate  1 g Oral TID WC & HS   Continuous Infusions: . sodium chloride 75 mL/hr at 04/08/20 0600          Glade Lloyd, MD [facility] 04/08/2020, 2:31 PM

## 2020-04-08 NOTE — Progress Notes (Signed)
Physical Therapy Treatment Patient Details Name: Susan Fernandez MRN: [id number] DOB: 1946-08-01 Today's Date: 04/08/2020    History of Present Illness Pt is a 74 yr old woman who presentes with complaints of weakness, nausea and vomiting. She states that this has been ongoing for 2 weeks. Pt additionally found to be anemic and with GIB due to large ulcers.  Pt's hgb was 6.1 this am but she has received 2 units PRBC.  Past medical history significant for pre-diabetes, Venous insufficiency, GERD, Hyperlipidemia, Hypertension, morbid obesity, history of right thalamic infarction Feb 2021, and CKD III.    PT Comments    Pt admitted with above diagnosis. Asked to assess this pt for vertigo however could not ilicit dizziness today. Also checked orthostatic VS supine and sitting and no issues with BP drop.  Pt had procedure this am therefore only agreeable to sit up on EOB for 10 minutes and do some exercise and rub lotion on her LEs.  Will follow acutely.   Pt currently with functional limitations due to the deficits listed below (see PT Problem List). Pt will benefit from skilled PT to increase their independence and safety with mobility to allow discharge to the venue listed below.     Follow Up Recommendations  SNF;Supervision/Assistance - 24 hour     Equipment Recommendations  3in1 (PT)    Recommendations for Other Services       Precautions / Restrictions Precautions Precautions: Fall Restrictions Weight Bearing Restrictions: No    Mobility  Bed Mobility Overal bed mobility: Needs Assistance Bed Mobility: Supine to Sit;Sit to Supine     Supine to sit: Min guard Sit to supine: Min guard   General bed mobility comments: Cues to scoot to EOB  Transfers                 General transfer comment: refused to stand  Ambulation/Gait                 Stairs             Wheelchair Mobility    Modified Rankin (Stroke Patients Only)       Balance  Overall balance assessment: Needs assistance Sitting-balance support: No upper extremity supported;Feet supported Sitting balance-Leahy Scale: Fair Sitting balance - Comments: sat EOB for 10 min and did some exercises and rubbed lotion on her LEs                                    Cognition Arousal/Alertness: Awake/alert Behavior During Therapy: Anxious Overall Cognitive Status: Within Functional Limits for tasks assessed                                 General Comments: Pt appears to be self limiting      Exercises General Exercises - Lower Extremity Ankle Circles/Pumps: AROM;Both;10 reps;Seated Long Arc Quad: AROM;Both;10 reps;Seated    General Comments General comments (skin integrity, edema, etc.): VSS with supine BP 135/63 and sitting 146/69.  No dizziness anymore per pt.  Pt does report bil numbness foot to knee.        Pertinent Vitals/Pain Pain Assessment: No/denies pain    Home Living                      Prior Function  PT Goals (current goals can now be found in the care plan section) Acute Rehab PT Goals Patient Stated Goal: to go home Progress towards PT goals: Progressing toward goals    Frequency    Min 3X/week      PT Plan Current plan remains appropriate    Co-evaluation              AM-PAC PT "6 Clicks" Mobility   Outcome Measure  Help needed turning from your back to your side while in a flat bed without using bedrails?: None Help needed moving from lying on your back to sitting on the side of a flat bed without using bedrails?: None Help needed moving to and from a bed to a chair (including a wheelchair)?: A Little Help needed standing up from a chair using your arms (e.g., wheelchair or bedside chair)?: A Little Help needed to walk in hospital room?: A Lot Help needed climbing 3-5 steps with a railing? : A Lot 6 Click Score: 18    End of Session Equipment Utilized During Treatment:  Gait belt Activity Tolerance: Patient limited by fatigue Patient left: in bed;with call bell/phone within reach;with bed alarm set Nurse Communication: Mobility status PT Visit Diagnosis: Dizziness and giddiness (R42);Other abnormalities of gait and mobility (R26.89);Muscle weakness (generalized) (M62.81)     Time: 3762-8315 PT Time Calculation (min) (ACUTE ONLY): 25 min  Charges:  $Therapeutic Exercise: 8-22 mins $Therapeutic Activity: 8-22 mins                     Delainy Mcelhiney W,PT Acute Rehabilitation Services Pager:  [phone]  Office:  [phone]     [name] 04/08/2020, 2:01 PM

## 2020-04-08 NOTE — Anesthesia Procedure Notes (Signed)
Procedure Name: MAC Date/Time: 04/08/2020 8:53 AM Performed by: Renato Shin, CRNA Pre-anesthesia Checklist: Patient identified, Emergency Drugs available, Suction available and Patient being monitored Patient Re-evaluated:Patient Re-evaluated prior to induction Oxygen Delivery Method: Nasal cannula Preoxygenation: Pre-oxygenation with 100% oxygen Induction Type: IV induction Placement Confirmation: positive ETCO2 and breath sounds checked- equal and bilateral Dental Injury: Teeth and Oropharynx as per pre-operative assessment

## 2020-04-08 NOTE — TOC Progression Note (Addendum)
Transition of Care [facility]) - Progression Note    Patient Details  Name: Susan Fernandez MRN: [id number] Date of Birth: 02/04/1946  Transition [facility] CM/SW Contact  Terrial Rhodes, LCSWA Phone Number: 04/08/2020, 3:14 PM  Clinical Narrative:     Update 7/28-[facility] said they will not have a bed available until Friday. CSW let patient know. Patient wants to go to [facility]. CSW tried to call [facility] back. CSW left voicemail. CSW awaiting call back from [facility] to confirm they can take patient on Friday for SNF placement.  CSW will continue to follow. CSW spoke with patient at bedside. Patient chose SNF bed at [facility]. CSW left voicemail to call back to confirm bed offer.Insurance authorization has been Approived for 7/28 through 7/30. Next review date is 7/30. CSW will need to resubmit clinicals for review on 7/30. Insurance authorization approval number is #[id number]  CSW awaiting call back from [facility] to confirm SNF bed offer. Insurance authorization has been approved.  Expected Discharge Plan: Skilled Nursing Facility Barriers to Discharge: Continued Medical Work up  Expected Discharge Plan and Services Expected Discharge Plan: Skilled Nursing Facility       Living arrangements for the past 2 months: Single Family Home                                       Social Determinants of Health (SDOH) Interventions    Readmission Risk Interventions No flowsheet data found.

## 2020-04-08 NOTE — Anesthesia Preprocedure Evaluation (Addendum)
Anesthesia Evaluation  Patient identified by MRN, date of birth, ID band Patient awake    Reviewed: Allergy & Precautions, H&P , NPO status , Patient's Chart, lab work & pertinent test results  Airway Mallampati: II  TM Distance: >3 FB Neck ROM: Full    Dental no notable dental hx. (+) Edentulous Upper, Partial Lower, Dental Advisory Given   Pulmonary neg pulmonary ROS,    Pulmonary exam normal breath sounds clear to auscultation       Cardiovascular hypertension, Pt. on medications  Rhythm:Regular Rate:Normal     Neuro/Psych negative neurological ROS  negative psych ROS   GI/Hepatic Neg liver ROS, PUD, GERD  ,  Endo/Other  negative endocrine ROS  Renal/GU negative Renal ROS  negative genitourinary   Musculoskeletal   Abdominal   Peds  Hematology  (+) Blood dyscrasia, anemia ,   Anesthesia Other Findings   Reproductive/Obstetrics negative OB ROS                            Anesthesia Physical Anesthesia Plan  ASA: II  Anesthesia Plan: MAC   Post-op Pain Management:    Induction: Intravenous  PONV Risk Score and Plan: 2 and Propofol infusion and Treatment may vary due to age or medical condition  Airway Management Planned: Nasal Cannula  Additional Equipment:   Intra-op Plan:   Post-operative Plan:   Informed Consent: I have reviewed the patients History and Physical, chart, labs and discussed the procedure including the risks, benefits and alternatives for the proposed anesthesia with the patient or authorized representative who has indicated his/her understanding and acceptance.     Dental advisory given  Plan Discussed with: CRNA  Anesthesia Plan Comments:         Anesthesia Quick Evaluation

## 2020-04-08 NOTE — Op Note (Signed)
[facility] Patient Name: Cresta Riden Procedure Date : 04/08/2020 MRN: [id number] Attending MD: Doristine Locks , MD Date of Birth: [birth year] CSN: [id number] Age: 74 Admit Type: Inpatient Procedure:                Upper GI endoscopy Indications:              Follow-up of gastric ulcer                           74 yo female admitted with acute UGIB. EGD on 7/25                            with large ulcer in the gastric incisura, treated                            with gold probe, epinephrine, then Hemospray. No                            recurrence of bleeding, and Hgb/Hct now stable                            after blood transfusions. H pylori serologies                            positive, and patient started on quadruple therapy                            along with continued high dose PPI. Presents today                            for endoscopic relook and evaluation of gastric                            fundus that was previously not visualized due to                            retained blood clot. Providers:                Doristine Locks, MD, Glory Rosebush, RN, Lawson Radar, Technician, Leona Singleton, CRNA Referring MD:              Medicines:                Monitored Anesthesia Care Complications:            No immediate complications. Estimated Blood Loss:     Estimated blood loss was minimal. Estimated blood                            loss: none. Procedure:                Pre-Anesthesia Assessment:                           -  Prior to the procedure, a History and Physical                            was performed, and patient medications and                            allergies were reviewed. The patient's tolerance of                            previous anesthesia was also reviewed. The risks                            and benefits of the procedure and the sedation                            options and risks were discussed  with the patient.                            All questions were answered, and informed consent                            was obtained. Prior Anticoagulants: The patient has                            taken no previous anticoagulant or antiplatelet                            agents. ASA Grade Assessment: III - A patient with                            severe systemic disease. After reviewing the risks                            and benefits, the patient was deemed in                            satisfactory condition to undergo the procedure.                           After obtaining informed consent, the endoscope was                            passed under direct vision. Throughout the                            procedure, the patient's blood pressure, pulse, and                            oxygen saturations were monitored continuously. The                            GIF-H190 ([id number]) Olympus gastroscope was  introduced through the mouth, and advanced to the                            second part of duodenum. The upper GI endoscopy was                            accomplished without difficulty. The patient                            tolerated the procedure well. Scope In: Scope Out: Findings:      The examined esophagus was normal.      Two non-bleeding cratered gastric ulcers with no stigmata of bleeding       were found in the gastric body and in the gastric antrum. The largest       lesion was 5 mm in largest dimension.      One very large non-bleeding cratered gastric ulcer was found at the       incisura. The lesion was 20 mm in largest dimension. There was an       adherent clot overlying the previously seen and treated visible vessel       in the ulcer base. There was a clip noted on the edge of the ulcer bed,       previously placed for radiographic visualization in the event that IR       embolization was needed. Since there was no active bleeding and  the clot       was firmly adherent (despite direct irrigation in attempt to dislodge       and retreat) to the previously treated vessel, no further endoscopic       intervention was attempted.      Diffuse mild inflammation characterized by erythema was found in the       gastric fundus, in the gastric body and in the gastric antrum. Given the       recently confirmed H pylori, this was not biopsied.      The first portion of the duodenum and second portion of the duodenum       were normal.      Localized mildly erythematous mucosa without active bleeding and with no       stigmata of bleeding was found in the duodenal bulb. Impression:               - Normal esophagus.                           - Non-bleeding gastric ulcers with no stigmata of                            bleeding.                           - Large ulcer with adherent clot overlying the                            previously treated visible vessel. While there was                            no active bleeding, the size  and appearance of the                            ulcer are still at elevated risk of future rebleed.                            Will plan to continue treating with high dose PPI                            and adding Carafate as below. In the event of a                            rebleed, there is a clip at the ulcer edge that can                            be used as a radiographic target, and would consult                            IR for embolization. Otherwise, will continue with                            medical management.                           - Gastritis.                           - Normal first portion of the duodenum and second                            portion of the duodenum.                           - Erythematous duodenopathy.                           - No specimens collected. Recommendation:           - Return patient to hospital ward for ongoing care.                           -  Full liquid diet now, and advance as tolerated to                            previous diet.                           - Continue present medications.                           - Use Protonix (pantoprazole) 40 mg PO BID for 8                            weeks.                           -  Use sucralfate suspension 1 gram PO QID for 4                            weeks.                           - Repeat upper endoscopy in 8 weeks to check                            healing.                           - Return to GI clinic at appointment to be                            scheduled.                           - Continue avoiding NSAIDs.                           - Continue H pylori antimicrobial therapy                            (quadruple therapy x14 days total).                           - Repeat CBC 7-10 days after hospital discharge.                           - Please do not hesitate to contact the GI service                            with additional questions or concerns. Procedure Code(s):        --- Professional ---                           [phone], Esophagogastroduodenoscopy, flexible,                            transoral; diagnostic, including collection of                            specimen(s) by brushing or washing, when performed                            (separate procedure) Diagnosis Code(s):        --- Professional ---                           K25.9, Gastric ulcer, unspecified as acute or                            chronic, without hemorrhage or perforation                           K25.4, Chronic  or unspecified gastric ulcer with                            hemorrhage                           K29.70, Gastritis, unspecified, without bleeding                           K31.89, Other diseases of stomach and duodenum CPT copyright 2019 American Medical Association. All rights reserved. The codes documented in this report are preliminary and upon coder review may  be revised to  meet current compliance requirements. Doristine Locks, MD 04/08/2020 9:26:27 AM Number of Addenda: 0

## 2020-04-08 NOTE — Interval H&P Note (Signed)
History and Physical Interval Note:  04/08/2020 8:25 AM  Susan Fernandez  has presented today for surgery, with the diagnosis of gi bleed, gastric ulcers.  The various methods of treatment have been discussed with the patient and family. After consideration of risks, benefits and other options for treatment, the patient has consented to  Procedure(s): ESOPHAGOGASTRODUODENOSCOPY (EGD) WITH PROPOFOL (N/A) as a surgical intervention.  The patient's history has been reviewed, patient examined, no change in status, stable for surgery.  I have reviewed the patient's chart and labs.  Questions were answered to the patient's satisfaction.     Verlin Dike Lilias Lorensen

## 2020-04-09 ENCOUNTER — Encounter (HOSPITAL_COMMUNITY): Payer: Self-pay | Admitting: Gastroenterology

## 2020-04-09 ENCOUNTER — Encounter: Payer: Self-pay | Admitting: Gastroenterology

## 2020-04-09 LAB — MAGNESIUM: Magnesium: 1.7 mg/dL (ref 1.7–2.4)

## 2020-04-09 LAB — CBC WITH DIFFERENTIAL/PLATELET
Abs Immature Granulocytes: 0.03 10*3/uL (ref 0.00–0.07)
Basophils Absolute: 0 10*3/uL (ref 0.0–0.1)
Basophils Relative: 0 %
Eosinophils Absolute: 0.1 10*3/uL (ref 0.0–0.5)
Eosinophils Relative: 2 %
HCT: 26.6 % — ABNORMAL LOW (ref 36.0–46.0)
Hemoglobin: 8.4 g/dL — ABNORMAL LOW (ref 12.0–15.0)
Immature Granulocytes: 1 %
Lymphocytes Relative: 29 %
Lymphs Abs: 1.9 10*3/uL (ref 0.7–4.0)
MCH: 28.7 pg (ref 26.0–34.0)
MCHC: 31.6 g/dL (ref 30.0–36.0)
MCV: 90.8 fL (ref 80.0–100.0)
Monocytes Absolute: 0.5 10*3/uL (ref 0.1–1.0)
Monocytes Relative: 8 %
Neutro Abs: 4.1 10*3/uL (ref 1.7–7.7)
Neutrophils Relative %: 60 %
Platelets: 179 10*3/uL (ref 150–400)
RBC: 2.93 MIL/uL — ABNORMAL LOW (ref 3.87–5.11)
RDW: 16.7 % — ABNORMAL HIGH (ref 11.5–15.5)
WBC: 6.7 10*3/uL (ref 4.0–10.5)
nRBC: 0 % (ref 0.0–0.2)

## 2020-04-09 LAB — BASIC METABOLIC PANEL
Anion gap: 6 (ref 5–15)
BUN: 9 mg/dL (ref 8–23)
CO2: 25 mmol/L (ref 22–32)
Calcium: 8.2 mg/dL — ABNORMAL LOW (ref 8.9–10.3)
Chloride: 109 mmol/L (ref 98–111)
Creatinine, Ser: 0.94 mg/dL (ref 0.44–1.00)
GFR calc Af Amer: >60 mL/min (ref >60)
GFR calc non Af Amer: >60 mL/min (ref >60)
Glucose, Bld: 100 mg/dL — ABNORMAL HIGH (ref 70–99)
Potassium: 3.2 mmol/L — ABNORMAL LOW (ref 3.5–5.1)
Sodium: 140 mmol/L (ref 135–145)

## 2020-04-09 LAB — GLUCOSE, CAPILLARY
Glucose-Capillary: 97 mg/dL (ref 70–99)
Glucose-Capillary: 99 mg/dL (ref 70–99)
Glucose-Capillary: 91 mg/dL (ref 70–99)

## 2020-04-09 LAB — SARS CORONAVIRUS 2 BY RT PCR (HOSPITAL ORDER, PERFORMED IN ~~LOC~~ HOSPITAL LAB): SARS Coronavirus 2: NEGATIVE

## 2020-04-09 MED ORDER — POTASSIUM CHLORIDE CRYS ER 20 MEQ PO TBCR
40.0000 meq | EXTENDED_RELEASE_TABLET | ORAL | Status: AC
  Administered 2020-04-09 (×2): 40 meq via ORAL
  Filled 2020-04-09 (×2): qty 2

## 2020-04-09 NOTE — TOC Progression Note (Addendum)
Transition of Care [facility]) - Progression Note    Patient Details  Name: Susan Fernandez MRN: [id number] Date of Birth: Mar 06, 1946  Transition of [facility]) CM/SW Contact  Terrial Rhodes, LCSWA Phone Number: 04/09/2020, 1:25 PM  Clinical Narrative:     Update 7/30-[facility] confirmed that they can accept patient for SNF placement.  CSW spoke with patient at bedside. CSW  Informed patient that CSW called [facility] to confirm SNF bed offer for tomorrow and Wille Celeste with [facility] said they will not have bed availability until the first of next week. Patient agreed to SNF placement at [facility]. CSW called [facility] to confirm bed offer. Awaiting call back from them.  Pending bed offer with [facility]. Insurance authorization has been approved.  Expected Discharge Plan: Skilled Nursing Facility Barriers to Discharge: Continued Medical Work up  Expected Discharge Plan and Services Expected Discharge Plan: Skilled Nursing Facility       Living arrangements for the past 2 months: Single Family Home                                       Social Determinants of Health (SDOH) Interventions    Readmission Risk Interventions No flowsheet data found.

## 2020-04-09 NOTE — Progress Notes (Signed)
Patient ID: Susan Fernandez, female   DOB: 1946-03-06, 74 y.o.   MRN: [id number]  PROGRESS NOTE    Susan Fernandez  NWG:[id number] DOB: [birth year] DOA: 04/03/2020 PCP: [name], MD   Brief Narrative:  74 year old female with history of prediabetes, venous insufficiency, GERD, hyperlipidemia, hypertension, obesity, right thalamic infarction, CKD 3 presented with worsening weakness, nausea and vomiting for 2 weeks.  In the ED, she was found to be orthostatic with hemoglobin of 8.6.  CT of the abdomen showed a gallbladder full of sludge/stones.  Right upper quadrant ultrasound was normal.  GI was consulted.  She was found to have coffee-ground emesis on 04/05/2020.  She underwent EGD on 04/05/2020 which showed large gastric ulcer.  She underwent repeat EGD on 04/08/2020.  Assessment & Plan:   Upper GI bleed secondary to large gastric ulcer Intractable nausea and vomiting -Noted to have about 400 cc of coffee-ground emesis on 04/05/2020 -LFTs normal except for T bili of 1.9 on admission.  Currently T bili within normal limits.  Lipase within normal limits -CT abdomen/pelvis showed gallbladder with mixture of biliary stones and sludge without features of acute cholecystitis -Right upper quadrant ultrasound was normal, showed no gallstones or wall thickening -GI consulted, status post emergent endoscopy on 04/05/2020 which showed giant gastric ulcer with active bleeding status post epi, coagulation, and hemostatic clip and spray for hemostasis.  Noted small nonbleeding gastric ulcers, large volume dense clots in the proximal stomach.   -Status post repeat EGD today which showed large ulcer with adherent clot.  Continue PPI and sucralfate along with quadruple therapy for 14 days.  Advance diet as tolerated -Monitor H&H.  Hemoglobin 8.4 today. -If patient has recurrent hematemesis, urgent IR consult for embolizing therapy as per GI  Acute blood loss anemia -Baseline hemoglobin around 12,  dropped down to 7.1.  Hemoglobin 8.4 today.  Status post 4 units packed red cells transfusion so far during the hospitalization  AKI on chronic kidney disease stage IIIa -Baseline creatinine 1.3; on admission 1.59.  0.94 today.  Orthostatic hypotension History of hypertension -Probably from GI bleed.  Improved -BP meds on hold  Leukocytosis -Probably reactive resolved  Obesity -Outpatient follow-up  Generalized deconditioning -PT recommends SNF placement.  Social worker following  DVT prophylaxis: SCDs Code Status: Full Family Communication: None at bedside Disposition Plan: Status is: Inpatient  Remains inpatient appropriate because:Inpatient level of care appropriate due to severity of illness.  Patient is medically stable for discharge   Dispo:  Patient From: Home  Planned Disposition: Skilled Nursing Facility  Expected discharge date: 04/09/20  Medically stable for discharge: Yes  Consultants: GI Procedures: emergent endoscopy on 04/05/2020 which showed giant gastric ulcer with active bleeding status post epi, coagulation, and hemostatic clip and spray for hemostasis.  Noted small nonbleeding gastric ulcers, large volume dense clots in the proximal stomach.   Repeat EGD on 04/08/2020 Impression:               - Normal esophagus.                           - Non-bleeding gastric ulcers with no stigmata of                            bleeding.                           -  Large ulcer with adherent clot overlying the                            previously treated visible vessel. While there was                            no active bleeding, the size and appearance of the                            ulcer are still at elevated risk of future rebleed.                            Will plan to continue treating with high dose PPI                            and adding Carafate as below. In the event of a                            rebleed, there is a clip at the ulcer edge that  can                            be used as a radiographic target, and would consult                            IR for embolization. Otherwise, will continue with                            medical management.                           - Gastritis.                           - Normal first portion of the duodenum and second                            portion of the duodenum.                           - Erythematous duodenopathy.                           - No specimens collected. Recommendation:           - Return patient to hospital ward for ongoing care.                           - Full liquid diet now, and advance as tolerated to                            previous diet.                           - Continue present medications.                           -  Use Protonix (pantoprazole) 40 mg PO BID for 8                            weeks.                           - Use sucralfate suspension 1 gram PO QID for 4                            weeks.                           - Repeat upper endoscopy in 8 weeks to check                            healing.                           - Return to GI clinic at appointment to be                            scheduled.                           - Continue avoiding NSAIDs.                           - Continue H pylori antimicrobial therapy                            (quadruple therapy x14 days total).                           - Repeat CBC 7-10 days after hospital discharge.                           - Please do not hesitate to contact the GI service                            with additional questions or concerns.  Antimicrobials:  Anti-infectives (From admission, onward)   Start     Dose/Rate Route Frequency Ordered Stop   04/08/20 1200  metroNIDAZOLE (FLAGYL) tablet 250 mg     Discontinue     250 mg Oral Every 6 hours 04/08/20 0958     04/08/20 1000  doxycycline (VIBRA-TABS) tablet 100 mg     Discontinue     100 mg Oral Every 12 hours 04/08/20 0958          Subjective: Patient seen and examined at bedside.  Tolerating diet.  Denies worsening abdominal pain, fever, nausea or vomiting. Objective: Vitals:   04/09/20 0209 04/09/20 0528 04/09/20 0733 04/09/20 0737  BP:  (!) 141/70  124/80  Pulse: 60 66  66  Resp:  19  18  Temp:  98.6 F (37 C) 98.6 F (37 C)   TempSrc:  Oral Oral   SpO2: 98% 100%  100%  Weight:      Height:        Intake/Output Summary (Last 24  hours) at 04/09/2020 0739 Last data filed at 04/09/2020 0527 Gross per 24 hour  Intake 560 ml  Output 1300 ml  Net -740 ml   Filed Weights   04/07/20 0502 04/08/20 0534 04/08/20 0822  Weight: (!) 100.9 kg (!) 100.4 kg (!) 100.4 kg    Examination:  General exam: No acute distress. Respiratory system: Bilateral decreased breath sounds at bases, no wheezing Cardiovascular system: Rate controlled, S1-S2 heard Gastrointestinal system: Abdomen is nondistended, soft and nontender.  Bowel sounds are heard  extremities: Mild lower extremity edema present.  No clubbing  Data Reviewed: I have personally reviewed following labs and imaging studies  CBC: Recent Labs  Lab 04/05/20 0612 04/05/20 1824 04/07/20 0039 04/07/20 0622 04/07/20 1312 04/08/20 1133 04/09/20 0358  WBC 8.4  --   --  9.1 9.6 7.3 6.7  NEUTROABS 5.6  --   --  5.4 6.0 5.1 4.1  HGB 7.1*   < > 7.6* 7.7* 9.4* 9.6* 8.4*  HCT 22.0*   < > 22.8* 23.5* 29.0* 29.2* 26.6*  MCV 96.5  --   --  89.4 89.8 90.7 90.8  PLT 242  --   --  163 165 178 179   < > = values in this interval not displayed.   Basic Metabolic Panel: Recent Labs  Lab 04/05/20 0612 04/06/20 0611 04/07/20 0622 04/08/20 1133 04/09/20 0358  NA 134* 138 138 139 140  K 3.5 3.9 3.3* 3.2* 3.2*  CL 102 107 107 110 109  CO2 23 22 24 22 25   GLUCOSE 102* 119* 89 120* 100*  BUN 21 36* 28* 14 9  CREATININE 1.05* 1.16* 1.13* 1.02* 0.94  CALCIUM 8.4* 8.1* 7.9* 8.2* 8.2*  MG  --   --   --   --  1.7   GFR: Estimated Creatinine Clearance:  68.2 mL/min (by C-G formula based on SCr of 0.94 mg/dL). Liver Function Tests: Recent Labs  Lab 04/03/20 1355 04/04/20 0442  AST 58* 43*  ALT 23 20  ALKPHOS 56 47  BILITOT 1.9* 0.6  PROT 8.0 6.3*  ALBUMIN 3.0* 2.5*   Recent Labs  Lab 04/03/20 1355  LIPASE 39   No results for input(s): AMMONIA in the last 168 hours. Coagulation Profile: No results for input(s): INR, PROTIME in the last 168 hours. Cardiac Enzymes: No results for input(s): CKTOTAL, CKMB, CKMBINDEX, TROPONINI in the last 168 hours. BNP (last 3 results) No results for input(s): PROBNP in the last 8760 hours. HbA1C: No results for input(s): HGBA1C in the last 72 hours. CBG: Recent Labs  Lab 04/03/20 1257 04/08/20 2044  GLUCAP 157* 105*   Lipid Profile: No results for input(s): CHOL, HDL, LDLCALC, TRIG, CHOLHDL, LDLDIRECT in the last 72 hours. Thyroid Function Tests: No results for input(s): TSH, T4TOTAL, FREET4, T3FREE, THYROIDAB in the last 72 hours. Anemia Panel: No results for input(s): VITAMINB12, FOLATE, FERRITIN, TIBC, IRON, RETICCTPCT in the last 72 hours. Sepsis Labs: No results for input(s): PROCALCITON, LATICACIDVEN in the last 168 hours.  Recent Results (from the past 240 hour(s))  SARS Coronavirus 2 by RT PCR (hospital order, performed in [facility] hospital lab) Nasopharyngeal Nasopharyngeal Swab     Status: None   Collection Time: 04/03/20 10:29 PM   Specimen: Nasopharyngeal Swab  Result Value Ref Range Status   SARS Coronavirus 2 NEGATIVE NEGATIVE Final    Comment: (NOTE) SARS-CoV-2 target nucleic acids are NOT DETECTED.  The SARS-CoV-2 RNA is generally detectable in upper and lower respiratory specimens during the  acute phase of infection. The lowest concentration of SARS-CoV-2 viral copies this assay can detect is 250 copies / mL. A negative result does not preclude SARS-CoV-2 infection and should not be used as the sole basis for treatment or other patient management decisions.  A  negative result may occur with improper specimen collection / handling, submission of specimen other than nasopharyngeal swab, presence of viral mutation(s) within the areas targeted by this assay, and inadequate number of viral copies (<250 copies / mL). A negative result must be combined with clinical observations, patient history, and epidemiological information.  Fact Sheet for Patients:   BoilerBrush.com.cy  Fact Sheet for Healthcare Providers: https://pope.com/  This test is not yet approved or  cleared by the [place] FDA and has been authorized for detection and/or diagnosis of SARS-CoV-2 by FDA under an Emergency Use Authorization (EUA).  This EUA will remain in effect (meaning this test can be used) for the duration of the COVID-19 declaration under Section 564(b)(1) of the Act, 21 U.S.C. section 360bbb-3(b)(1), unless the authorization is terminated or revoked sooner.  Performed at [facility], [address] [place], [place] [address]   Culture, Urine     Status: Abnormal   Collection Time: 04/04/20  3:16 PM   Specimen: Urine, Random  Result Value Ref Range Status   Specimen Description URINE, RANDOM  Final   Special Requests   Final    NONE Performed at [facility], [address] [place], [place] [address]    Culture (A)  Final    30,000 COLONIES/mL MULTIPLE SPECIES PRESENT, SUGGEST RECOLLECTION   Report Status 04/05/2020 FINAL  Final  Culture, blood (routine x 2)     Status: None (Preliminary result)   Collection Time: 04/05/20  6:20 AM   Specimen: BLOOD  Result Value Ref Range Status   Specimen Description BLOOD RIGHT ANTECUBITAL  Final   Special Requests   Final    BOTTLES DRAWN AEROBIC AND ANAEROBIC Blood Culture adequate volume   Culture   Final    NO GROWTH 2 DAYS Performed at [facility], [address] [place], [place] [address]    Report Status PENDING  Incomplete    Culture, blood (routine x 2)     Status: None (Preliminary result)   Collection Time: 04/05/20  6:25 AM   Specimen: BLOOD RIGHT HAND  Result Value Ref Range Status   Specimen Description BLOOD RIGHT HAND  Final   Special Requests   Final    BOTTLES DRAWN AEROBIC AND ANAEROBIC Blood Culture adequate volume   Culture   Final    NO GROWTH 2 DAYS Performed at [facility], [address], [place], [place] [address]    Report Status PENDING  Incomplete         Radiology Studies: No results found.      Scheduled Meds: . sodium chloride   Intravenous Once  . bismuth subsalicylate  524 mg Oral QID  . doxycycline  100 mg Oral Q12H  . metroNIDAZOLE  250 mg Oral Q6H  . pantoprazole  40 mg Intravenous Q12H  . sucralfate  1 g Oral TID WC & HS   Continuous Infusions: . sodium chloride 50 mL/hr at 04/08/20 1641          Chrys Landgrebe Hanley Ben, MD [facility] 04/09/2020, 7:39 AM

## 2020-04-09 NOTE — Care Management Important Message (Signed)
Important Message  Patient Details  Name: Susan Fernandez MRN: [id number] Date of Birth: March 18, 1946   Medicare Important Message Given:  Yes     Renie Ora 04/09/2020, 9:35 AM

## 2020-04-10 DIAGNOSIS — I69391 Dysphagia following cerebral infarction: Secondary | ICD-10-CM | POA: Diagnosis not present

## 2020-04-10 DIAGNOSIS — I69398 Other sequelae of cerebral infarction: Secondary | ICD-10-CM | POA: Diagnosis not present

## 2020-04-10 DIAGNOSIS — R5381 Other malaise: Secondary | ICD-10-CM | POA: Diagnosis not present

## 2020-04-10 DIAGNOSIS — E782 Mixed hyperlipidemia: Secondary | ICD-10-CM | POA: Diagnosis not present

## 2020-04-10 DIAGNOSIS — R112 Nausea with vomiting, unspecified: Secondary | ICD-10-CM | POA: Diagnosis not present

## 2020-04-10 DIAGNOSIS — R41841 Cognitive communication deficit: Secondary | ICD-10-CM | POA: Diagnosis not present

## 2020-04-10 DIAGNOSIS — N183 Chronic kidney disease, stage 3 unspecified: Secondary | ICD-10-CM | POA: Diagnosis not present

## 2020-04-10 DIAGNOSIS — I951 Orthostatic hypotension: Secondary | ICD-10-CM | POA: Diagnosis not present

## 2020-04-10 DIAGNOSIS — D62 Acute posthemorrhagic anemia: Secondary | ICD-10-CM | POA: Diagnosis not present

## 2020-04-10 DIAGNOSIS — R279 Unspecified lack of coordination: Secondary | ICD-10-CM | POA: Diagnosis not present

## 2020-04-10 DIAGNOSIS — N179 Acute kidney failure, unspecified: Secondary | ICD-10-CM | POA: Diagnosis not present

## 2020-04-10 DIAGNOSIS — I69328 Other speech and language deficits following cerebral infarction: Secondary | ICD-10-CM | POA: Diagnosis not present

## 2020-04-10 DIAGNOSIS — Z7401 Bed confinement status: Secondary | ICD-10-CM | POA: Diagnosis not present

## 2020-04-10 DIAGNOSIS — R1013 Epigastric pain: Secondary | ICD-10-CM | POA: Diagnosis not present

## 2020-04-10 DIAGNOSIS — I872 Venous insufficiency (chronic) (peripheral): Secondary | ICD-10-CM | POA: Diagnosis not present

## 2020-04-10 DIAGNOSIS — N1831 Chronic kidney disease, stage 3a: Secondary | ICD-10-CM | POA: Diagnosis not present

## 2020-04-10 DIAGNOSIS — K250 Acute gastric ulcer with hemorrhage: Secondary | ICD-10-CM | POA: Diagnosis not present

## 2020-04-10 DIAGNOSIS — R7303 Prediabetes: Secondary | ICD-10-CM | POA: Diagnosis not present

## 2020-04-10 DIAGNOSIS — M2550 Pain in unspecified joint: Secondary | ICD-10-CM | POA: Diagnosis not present

## 2020-04-10 DIAGNOSIS — E669 Obesity, unspecified: Secondary | ICD-10-CM | POA: Diagnosis not present

## 2020-04-10 DIAGNOSIS — R7309 Other abnormal glucose: Secondary | ICD-10-CM | POA: Diagnosis not present

## 2020-04-10 DIAGNOSIS — R531 Weakness: Secondary | ICD-10-CM | POA: Diagnosis not present

## 2020-04-10 DIAGNOSIS — M6281 Muscle weakness (generalized): Secondary | ICD-10-CM | POA: Diagnosis not present

## 2020-04-10 DIAGNOSIS — I10 Essential (primary) hypertension: Secondary | ICD-10-CM | POA: Diagnosis not present

## 2020-04-10 DIAGNOSIS — I129 Hypertensive chronic kidney disease with stage 1 through stage 4 chronic kidney disease, or unspecified chronic kidney disease: Secondary | ICD-10-CM | POA: Diagnosis not present

## 2020-04-10 LAB — BASIC METABOLIC PANEL
Anion gap: 8 (ref 5–15)
BUN: 6 mg/dL — ABNORMAL LOW (ref 8–23)
CO2: 23 mmol/L (ref 22–32)
Calcium: 8.3 mg/dL — ABNORMAL LOW (ref 8.9–10.3)
Chloride: 110 mmol/L (ref 98–111)
Creatinine, Ser: 0.98 mg/dL (ref 0.44–1.00)
GFR calc Af Amer: >60 mL/min (ref >60)
GFR calc non Af Amer: 58 mL/min — ABNORMAL LOW (ref >60)
Glucose, Bld: 97 mg/dL (ref 70–99)
Potassium: 3.8 mmol/L (ref 3.5–5.1)
Sodium: 141 mmol/L (ref 135–145)

## 2020-04-10 LAB — CBC WITH DIFFERENTIAL/PLATELET
Abs Immature Granulocytes: 0.03 10*3/uL (ref 0.00–0.07)
Basophils Absolute: 0 10*3/uL (ref 0.0–0.1)
Basophils Relative: 0 %
Eosinophils Absolute: 0.1 10*3/uL (ref 0.0–0.5)
Eosinophils Relative: 1 %
HCT: 28.8 % — ABNORMAL LOW (ref 36.0–46.0)
Hemoglobin: 9.2 g/dL — ABNORMAL LOW (ref 12.0–15.0)
Immature Granulocytes: 1 %
Lymphocytes Relative: 25 %
Lymphs Abs: 1.6 10*3/uL (ref 0.7–4.0)
MCH: 29.9 pg (ref 26.0–34.0)
MCHC: 31.9 g/dL (ref 30.0–36.0)
MCV: 93.5 fL (ref 80.0–100.0)
Monocytes Absolute: 0.5 10*3/uL (ref 0.1–1.0)
Monocytes Relative: 8 %
Neutro Abs: 4.3 10*3/uL (ref 1.7–7.7)
Neutrophils Relative %: 65 %
Platelets: 173 10*3/uL (ref 150–400)
RBC: 3.08 MIL/uL — ABNORMAL LOW (ref 3.87–5.11)
RDW: 16.8 % — ABNORMAL HIGH (ref 11.5–15.5)
WBC: 6.5 10*3/uL (ref 4.0–10.5)
nRBC: 0 % (ref 0.0–0.2)

## 2020-04-10 LAB — CULTURE, BLOOD (ROUTINE X 2)
Culture: NO GROWTH
Culture: NO GROWTH
Special Requests: ADEQUATE
Special Requests: ADEQUATE

## 2020-04-10 LAB — MAGNESIUM: Magnesium: 1.6 mg/dL — ABNORMAL LOW (ref 1.7–2.4)

## 2020-04-10 MED ORDER — ATORVASTATIN CALCIUM 80 MG PO TABS: 80.0000 mg | ORAL_TABLET | Freq: Every evening | ORAL | Status: DC

## 2020-04-10 MED ORDER — METRONIDAZOLE 250 MG PO TABS: 250.0000 mg | ORAL_TABLET | Freq: Four times a day (QID) | ORAL | 0 refills | Status: AC

## 2020-04-10 MED ORDER — SUCRALFATE 1 GM/10ML PO SUSP: 1.0000 g | Freq: Three times a day (TID) | ORAL | 0 refills | Status: DC

## 2020-04-10 MED ORDER — BISMUTH SUBSALICYLATE 262 MG PO CHEW: 524.0000 mg | CHEWABLE_TABLET | Freq: Four times a day (QID) | ORAL | 0 refills | Status: AC

## 2020-04-10 MED ORDER — MAGNESIUM SULFATE 2 GM/50ML IV SOLN
2.0000 g | Freq: Once | INTRAVENOUS | Status: AC
  Administered 2020-04-10: 2 g via INTRAVENOUS
  Filled 2020-04-10: qty 50

## 2020-04-10 MED ORDER — PANTOPRAZOLE SODIUM 40 MG PO TBEC
40.0000 mg | DELAYED_RELEASE_TABLET | Freq: Two times a day (BID) | ORAL | 0 refills | Status: DC
Start: 2020-04-10 — End: 2020-04-23

## 2020-04-10 MED ORDER — DOXYCYCLINE HYCLATE 100 MG PO TABS: 100.0000 mg | ORAL_TABLET | Freq: Two times a day (BID) | ORAL | 0 refills | Status: AC

## 2020-04-10 NOTE — Progress Notes (Signed)
Patient ID: Susan Fernandez, female   DOB: 10/18/1945, 74 y.o.   MRN: [id number]  PROGRESS NOTE    Arisha Gervais  WUJ:[id number] DOB: 1946/06/27 DOA: 04/03/2020 PCP: [name], MD   Brief Narrative:  74 year old female with history of prediabetes, venous insufficiency, GERD, hyperlipidemia, hypertension, obesity, right thalamic infarction, CKD 3 presented with worsening weakness, nausea and vomiting for 2 weeks.  In the ED, she was found to be orthostatic with hemoglobin of 8.6.  CT of the abdomen showed a gallbladder full of sludge/stones.  Right upper quadrant ultrasound was normal.  GI was consulted.  She was found to have coffee-ground emesis on 04/05/2020.  She underwent EGD on 04/05/2020 which showed large gastric ulcer.  She underwent repeat EGD on 04/08/2020.  Assessment & Plan:   Upper GI bleed secondary to large gastric ulcer Intractable nausea and vomiting -Noted to have about 400 cc of coffee-ground emesis on 04/05/2020 -LFTs normal except for T bili of 1.9 on admission.  Currently T bili within normal limits.  Lipase within normal limits -CT abdomen/pelvis showed gallbladder with mixture of biliary stones and sludge without features of acute cholecystitis -Right upper quadrant ultrasound was normal, showed no gallstones or wall thickening -GI consulted, status post emergent endoscopy on 04/05/2020 which showed giant gastric ulcer with active bleeding status post epi, coagulation, and hemostatic clip and spray for hemostasis.  Noted small nonbleeding gastric ulcers, large volume dense clots in the proximal stomach.   -Status post repeat EGD today which showed large ulcer with adherent clot.  Continue PPI and sucralfate along with quadruple therapy for 14 days.   -Monitor H&H.  Hemoglobin 9.2 today. -If patient has recurrent hematemesis, urgent IR consult for embolizing therapy as per GI -Currently tolerating diet and medically stable for discharge.  Acute blood loss  anemia -Baseline hemoglobin around 12, dropped down to 7.1.  Hemoglobin 9.2 today.  Status post 4 units packed red cells transfusion so far during the hospitalization  AKI on chronic kidney disease stage IIIa -Baseline creatinine 1.3; on admission 1.59.  0.98 today.  Hypomagnesemia -Replace.  Orthostatic hypotension History of hypertension -Probably from GI bleed.  Improved -BP meds on hold  Leukocytosis -Probably reactive resolved  Obesity -Outpatient follow-up  Generalized deconditioning -PT recommends SNF placement.  Social worker following  DVT prophylaxis: SCDs Code Status: Full Family Communication: None at bedside Disposition Plan: Status is: Inpatient  Remains inpatient appropriate because:Inpatient level of care appropriate due to severity of illness.  Patient is medically stable for discharge   Dispo:  Patient From: Home  Planned Disposition: Skilled Nursing Facility  Expected discharge date: 04/09/20  Medically stable for discharge: Yes  Consultants: GI Procedures: emergent endoscopy on 04/05/2020 which showed giant gastric ulcer with active bleeding status post epi, coagulation, and hemostatic clip and spray for hemostasis.  Noted small nonbleeding gastric ulcers, large volume dense clots in the proximal stomach.   Repeat EGD on 04/08/2020 Impression:               - Normal esophagus.                           - Non-bleeding gastric ulcers with no stigmata of                            bleeding.                           -  Large ulcer with adherent clot overlying the                            previously treated visible vessel. While there was                            no active bleeding, the size and appearance of the                            ulcer are still at elevated risk of future rebleed.                            Will plan to continue treating with high dose PPI                            and adding Carafate as below. In the event of a                             rebleed, there is a clip at the ulcer edge that can                            be used as a radiographic target, and would consult                            IR for embolization. Otherwise, will continue with                            medical management.                           - Gastritis.                           - Normal first portion of the duodenum and second                            portion of the duodenum.                           - Erythematous duodenopathy.                           - No specimens collected. Recommendation:           - Return patient to hospital ward for ongoing care.                           - Full liquid diet now, and advance as tolerated to                            previous diet.                           - Continue present medications.                           -  Use Protonix (pantoprazole) 40 mg PO BID for 8                            weeks.                           - Use sucralfate suspension 1 gram PO QID for 4                            weeks.                           - Repeat upper endoscopy in 8 weeks to check                            healing.                           - Return to GI clinic at appointment to be                            scheduled.                           - Continue avoiding NSAIDs.                           - Continue H pylori antimicrobial therapy                            (quadruple therapy x14 days total).                           - Repeat CBC 7-10 days after hospital discharge.                           - Please do not hesitate to contact the GI service                            with additional questions or concerns.  Antimicrobials:  Anti-infectives (From admission, onward)   Start     Dose/Rate Route Frequency Ordered Stop   04/08/20 1200  metroNIDAZOLE (FLAGYL) tablet 250 mg     Discontinue     250 mg Oral Every 6 hours 04/08/20 0958     04/08/20 1000  doxycycline (VIBRA-TABS) tablet 100  mg     Discontinue     100 mg Oral Every 12 hours 04/08/20 0958         Subjective: Patient seen and examined at bedside.  Denies worsening shortness breath, vomiting, fever, black or bloody stools.   Objective: Vitals:   04/10/20 0435 04/10/20 0437 04/10/20 0500 04/10/20 0744  BP:  (!) 139/66  (!) 135/64  Pulse: 97 70  69  Resp:  17  16  Temp:  98.5 F (36.9 C)  98.6 F (37 C)  TempSrc:  Oral  Oral  SpO2: 100% 99%  99%  Weight:   (!) 103.4 kg   Height:  Intake/Output Summary (Last 24 hours) at 04/10/2020 1032 Last data filed at 04/10/2020 0800 Gross per 24 hour  Intake 600 ml  Output 1300 ml  Net -700 ml   Filed Weights   04/08/20 0534 04/08/20 0822 04/10/20 0500  Weight: (!) 100.4 kg (!) 100.4 kg (!) 103.4 kg    Examination:  General exam: No distress. Respiratory system: Bilateral decreased breath sounds at bases with some scattered crackles  cardiovascular system: S1-S2 heard, rate controlled Gastrointestinal system: Abdomen is nondistended, soft and nontender.  Normal bowel sounds heard  extremities: Mild lower extremity edema present.  No cyanosis  Data Reviewed: I have personally reviewed following labs and imaging studies  CBC: Recent Labs  Lab 04/07/20 0622 04/07/20 1312 04/08/20 1133 04/09/20 0358 04/10/20 0614  WBC 9.1 9.6 7.3 6.7 6.5  NEUTROABS 5.4 6.0 5.1 4.1 4.3  HGB 7.7* 9.4* 9.6* 8.4* 9.2*  HCT 23.5* 29.0* 29.2* 26.6* 28.8*  MCV 89.4 89.8 90.7 90.8 93.5  PLT 163 165 178 179 173   Basic Metabolic Panel: Recent Labs  Lab 04/06/20 0611 04/07/20 0622 04/08/20 1133 04/09/20 0358 04/10/20 0614  NA 138 138 139 140 141  K 3.9 3.3* 3.2* 3.2* 3.8  CL 107 107 110 109 110  CO2 22 24 22 25 23   GLUCOSE 119* 89 120* 100* 97  BUN 36* 28* 14 9 6*  CREATININE 1.16* 1.13* 1.02* 0.94 0.98  CALCIUM 8.1* 7.9* 8.2* 8.2* 8.3*  MG  --   --   --  1.7 1.6*   GFR: Estimated Creatinine Clearance: 66.4 mL/min (by C-G formula based on SCr of 0.98  mg/dL). Liver Function Tests: Recent Labs  Lab 04/03/20 1355 04/04/20 0442  AST 58* 43*  ALT 23 20  ALKPHOS 56 47  BILITOT 1.9* 0.6  PROT 8.0 6.3*  ALBUMIN 3.0* 2.5*   Recent Labs  Lab 04/03/20 1355  LIPASE 39   No results for input(s): AMMONIA in the last 168 hours. Coagulation Profile: No results for input(s): INR, PROTIME in the last 168 hours. Cardiac Enzymes: No results for input(s): CKTOTAL, CKMB, CKMBINDEX, TROPONINI in the last 168 hours. BNP (last 3 results) No results for input(s): PROBNP in the last 8760 hours. HbA1C: No results for input(s): HGBA1C in the last 72 hours. CBG: Recent Labs  Lab 04/03/20 1257 04/08/20 2044 04/09/20 0832 04/09/20 1200 04/09/20 1712  GLUCAP 157* 105* 97 99 91   Lipid Profile: No results for input(s): CHOL, HDL, LDLCALC, TRIG, CHOLHDL, LDLDIRECT in the last 72 hours. Thyroid Function Tests: No results for input(s): TSH, T4TOTAL, FREET4, T3FREE, THYROIDAB in the last 72 hours. Anemia Panel: No results for input(s): VITAMINB12, FOLATE, FERRITIN, TIBC, IRON, RETICCTPCT in the last 72 hours. Sepsis Labs: No results for input(s): PROCALCITON, LATICACIDVEN in the last 168 hours.  Recent Results (from the past 240 hour(s))  SARS Coronavirus 2 by RT PCR (hospital order, performed in [facility] hospital lab) Nasopharyngeal Nasopharyngeal Swab     Status: None   Collection Time: 04/03/20 10:29 PM   Specimen: Nasopharyngeal Swab  Result Value Ref Range Status   SARS Coronavirus 2 NEGATIVE NEGATIVE Final    Comment: (NOTE) SARS-CoV-2 target nucleic acids are NOT DETECTED.  The SARS-CoV-2 RNA is generally detectable in upper and lower respiratory specimens during the acute phase of infection. The lowest concentration of SARS-CoV-2 viral copies this assay can detect is 250 copies / mL. A negative result does not preclude SARS-CoV-2 infection and should not be used as the sole  basis for treatment or other patient management  decisions.  A negative result may occur with improper specimen collection / handling, submission of specimen other than nasopharyngeal swab, presence of viral mutation(s) within the areas targeted by this assay, and inadequate number of viral copies (<250 copies / mL). A negative result must be combined with clinical observations, patient history, and epidemiological information.  Fact Sheet for Patients:   BoilerBrush.com.cy  Fact Sheet for Healthcare Providers: https://pope.com/  This test is not yet approved or  cleared by the [place] FDA and has been authorized for detection and/or diagnosis of SARS-CoV-2 by FDA under an Emergency Use Authorization (EUA).  This EUA will remain in effect (meaning this test can be used) for the duration of the COVID-19 declaration under Section 564(b)(1) of the Act, 21 U.S.C. section 360bbb-3(b)(1), unless the authorization is terminated or revoked sooner.  Performed at [facility], [address] [place], [place] [address]   Culture, Urine     Status: Abnormal   Collection Time: 04/04/20  3:16 PM   Specimen: Urine, Random  Result Value Ref Range Status   Specimen Description URINE, RANDOM  Final   Special Requests   Final    NONE Performed at [facility], [address] [place], [place] [address]    Culture (A)  Final    30,000 COLONIES/mL MULTIPLE SPECIES PRESENT, SUGGEST RECOLLECTION   Report Status 04/05/2020 FINAL  Final  Culture, blood (routine x 2)     Status: None (Preliminary result)   Collection Time: 04/05/20  6:20 AM   Specimen: BLOOD  Result Value Ref Range Status   Specimen Description BLOOD RIGHT ANTECUBITAL  Final   Special Requests   Final    BOTTLES DRAWN AEROBIC AND ANAEROBIC Blood Culture adequate volume   Culture   Final    NO GROWTH 4 DAYS Performed at [facility], [address], [place], [place] [address]    Report Status PENDING   Incomplete  Culture, blood (routine x 2)     Status: None (Preliminary result)   Collection Time: 04/05/20  6:25 AM   Specimen: BLOOD RIGHT HAND  Result Value Ref Range Status   Specimen Description BLOOD RIGHT HAND  Final   Special Requests   Final    BOTTLES DRAWN AEROBIC AND ANAEROBIC Blood Culture adequate volume   Culture   Final    NO GROWTH 4 DAYS Performed at [facility], [address], [place], [place] [address]    Report Status PENDING  Incomplete  SARS Coronavirus 2 by RT PCR (hospital order, performed in [facility] hospital lab) Nasopharyngeal Nasopharyngeal Swab     Status: None   Collection Time: 04/09/20 10:49 AM   Specimen: Nasopharyngeal Swab  Result Value Ref Range Status   SARS Coronavirus 2 NEGATIVE NEGATIVE Final    Comment: (NOTE) SARS-CoV-2 target nucleic acids are NOT DETECTED.  The SARS-CoV-2 RNA is generally detectable in upper and lower respiratory specimens during the acute phase of infection. The lowest concentration of SARS-CoV-2 viral copies this assay can detect is 250 copies / mL. A negative result does not preclude SARS-CoV-2 infection and should not be used as the sole basis for treatment or other patient management decisions.  A negative result may occur with improper specimen collection / handling, submission of specimen other than nasopharyngeal swab, presence of viral mutation(s) within the areas targeted by this assay, and inadequate number of viral copies (<250 copies /  mL). A negative result must be combined with clinical observations, patient history, and epidemiological information.  Fact Sheet for Patients:   BoilerBrush.com.cy  Fact Sheet for Healthcare Providers: https://pope.com/  This test is not yet approved or  cleared by the [place] FDA and has been authorized for detection and/or diagnosis of SARS-CoV-2 by FDA under an Emergency Use Authorization (EUA).  This EUA  will remain in effect (meaning this test can be used) for the duration of the COVID-19 declaration under Section 564(b)(1) of the Act, 21 U.S.C. section 360bbb-3(b)(1), unless the authorization is terminated or revoked sooner.  Performed at [facility], [address] [place], [place] [address]          Radiology Studies: No results found.      Scheduled Meds: . sodium chloride   Intravenous Once  . bismuth subsalicylate  524 mg Oral QID  . doxycycline  100 mg Oral Q12H  . metroNIDAZOLE  250 mg Oral Q6H  . pantoprazole  40 mg Intravenous Q12H  . sucralfate  1 g Oral TID WC & HS   Continuous Infusions:         Glade Lloyd, MD [facility] 04/10/2020, 10:32 AM

## 2020-04-10 NOTE — Discharge Summary (Signed)
Physician Discharge Summary  Susan Fernandez WGN:[id number] DOB: Jul 15, 1946 DOA: 04/03/2020  PCP: [name], MD  Admit date: 04/03/2020 Discharge date: 04/10/2020  Admitted From: Home Disposition: SNF  Recommendations for Outpatient Follow-up:  1. Follow up with SNF provider at earliest convenience with repeat CBC/BMP in the next few days  2. outpatient follow-up with GI 3. Follow up in ED if symptoms worsen or new appear   Home Health: No Equipment/Devices: None  Discharge Condition: Stable CODE STATUS: Full Diet recommendation: Heart healthy  Brief/Interim Summary: 74 year old female with history of prediabetes, venous insufficiency, GERD, hyperlipidemia, hypertension, obesity, right thalamic infarction, CKD 3 presented with worsening weakness, nausea and vomiting for 2 weeks.  In the ED, she was found to be orthostatic with hemoglobin of 8.6.  CT of the abdomen showed a gallbladder full of sludge/stones.  Right upper quadrant ultrasound was normal.  GI was consulted.  She was found to have coffee-ground emesis on 04/05/2020.  She underwent EGD on 04/05/2020 which showed large gastric ulcer.  She underwent repeat EGD on 04/08/2020.  GI recommendations as below and recommend outpatient follow-up.  PT recommended SNF placement.  She will be discharged to SNF once bed is available.  Discharge Diagnoses:   Upper GI bleed secondary to large gastric ulcer Intractable nausea and vomiting -Noted to have about 400 cc of coffee-ground emesis on 04/05/2020 -LFTs normal except for T bili of 1.9 on admission.  Currently T bili within normal limits.  Lipase within normal limits -CT abdomen/pelvis showed gallbladder with mixture of biliary stones and sludge without features of acute cholecystitis -Right upper quadrant ultrasound was normal, showed no gallstones or wall thickening -GI consulted, status post emergent endoscopy on 04/05/2020 which showed giant gastric ulcer with active bleeding  status post epi, coagulation, and hemostatic clip and spray for hemostasis. Noted small nonbleeding gastric ulcers, large volume dense clots in the proximal stomach.  -Status post repeat EGD on 04/08/2020 which showed large ulcer with adherent clot.  GI recommended Protonix 40 mg twice a day for [redacted] weeks along with sucralfate suspension 1 g p.o. 4 times daily for 4 weeks.  She will also need quadruple therapy for H. pylori for a total of 2 weeks.  She will need repeat upper endoscopy in 8 weeks to check healing.  Outpatient follow-up with GI. -Subsequently patient has remained stable and tolerating diet.  Discharge patient to SNF today.  Acute blood loss anemia -Baseline hemoglobin around 12, dropped down to 7.1.  Hemoglobin 9.2 today.  Status post 4 units packed red cells transfusion so far during the hospitalization  AKI on chronic kidney disease stage IIIa -Baseline creatinine 1.3; on admission 1.59.  0.98 today.  Orthostatic hypotension History of hypertension -Probably from GI bleed.  Improved -BP meds on hold  Leukocytosis -Probably reactive resolved  Hypomagnesemia -Replace prior to discharge.  Obesity -Outpatient follow-up  Generalized deconditioning -PT recommends SNF placement.    Discharge Instructions  Discharge Instructions    Ambulatory referral to Gastroenterology   Complete by: As directed    Hospital follow-up for GI bleed   Diet - low sodium heart healthy   Complete by: As directed    Increase activity slowly   Complete by: As directed      Allergies as of 04/10/2020      Reactions   Ppd [tuberculin Purified Protein Derivative]    Sulfa Antibiotics Other (See Comments)   Passed out   Tolectin [tolmetin]       Medication List  STOP taking these medications   amLODipine 5 MG tablet Commonly known as: NORVASC   aspirin 81 MG EC tablet   fluticasone 50 MCG/ACT nasal spray Commonly known as: FLONASE     TAKE these medications    atorvastatin 80 MG tablet Commonly known as: LIPITOR Take 1 tablet (80 mg total) by mouth every evening.   bismuth subsalicylate 262 MG chewable tablet Commonly known as: PEPTO BISMOL Chew 2 tablets (524 mg total) by mouth 4 (four) times daily for 12 days.   doxycycline 100 MG tablet Commonly known as: VIBRA-TABS Take 1 tablet (100 mg total) by mouth every 12 (twelve) hours for 12 days.   metroNIDAZOLE 250 MG tablet Commonly known as: FLAGYL Take 1 tablet (250 mg total) by mouth every 6 (six) hours for 12 days.   pantoprazole 40 MG tablet Commonly known as: Protonix Take 1 tablet (40 mg total) by mouth 2 (two) times daily before a meal.   sucralfate 1 GM/10ML suspension Commonly known as: CARAFATE Take 10 mLs (1 g total) by mouth 4 (four) times daily -  with meals and at bedtime.       Contact information for follow-up providers    Cirigliano, Vito V, DO Follow up on 05/27/2020.   Specialty: Gastroenterology Why: 11 AM for follow up of ulcers.   Contact information: [address] [place] [address] [phone]            Contact information for after-discharge care    Destination    [facility] Preferred SNF .   Service: Skilled Nursing Contact information: [address] [place] [address] [phone]                 Allergies  Allergen Reactions  . Ppd [Tuberculin Purified Protein Derivative]   . Sulfa Antibiotics Other (See Comments)    Passed out  . Tolectin [Tolmetin]     Consultations:  GI   Procedures/Studies: CT ABDOMEN PELVIS WO CONTRAST  Result Date: 04/03/2020 CLINICAL DATA:  Nausea, vomiting EXAM: CT ABDOMEN AND PELVIS WITHOUT CONTRAST TECHNIQUE: Multidetector CT imaging of the abdomen and pelvis was performed following the standard protocol without IV contrast. COMPARISON:  None FINDINGS: Lower chest: Bandlike atelectasis in the right lung base with elevation of the right  hemidiaphragm. Lung bases otherwise clear. Normal heart size. No pericardial effusion. Coronary artery and aortic leaflet calcifications are present. Hepatobiliary: Subcentimeter hyper in focus at the dome of the liver too small to fully characterize on CT imaging but statistically likely benign (3/8). No visible concerning liver lesion. Smooth surface contour. Normal attenuation. Dependently layering hyperdense material within the gallbladder could reflect a mixture of stones and sludge. No pericholecystic fluid or inflammation. No ductal dilatation or visible intraductal gallstones. Pancreas: Unremarkable. No pancreatic ductal dilatation or surrounding inflammatory changes. Spleen: Normal in size. No concerning splenic lesions. Adrenals/Urinary Tract: Normal adrenal glands. Kidneys are normally located and symmetric in size without visible concerning renal lesion. Urolithiasis or hydronephrosis. Urinary bladder is unremarkable. Stomach/Bowel: Distal esophagus, stomach and duodenal sweep are unremarkable. No small bowel wall thickening or dilatation. No evidence of obstruction. A normal appendix is visualized. No colonic dilatation or wall thickening. Scattered colonic diverticula without focal inflammation to suggest diverticulitis. Vascular/Lymphatic: Atherosclerotic calcifications within the abdominal aorta and branch vessels. No aneurysm or ectasia. No enlarged abdominopelvic lymph nodes. Reproductive: Mildly retroverted uterus. No concerning adnexal lesions. Other: No abdominopelvic free fluid or free gas. No bowel containing  hernias. Musculoskeletal: Mild rightward curvature of the thoracolumbar spine with a degree of pelvic tilt. No acute or worrisome osseous abnormalities. Multilevel flowing anterior osteophytosis in the lower thoracic spine, compatible with features of diffuse idiopathic skeletal hyperostosis (DISH). Multilevel degenerative changes are present in the imaged portions of the spine.  Additional degenerative changes in the hips and pelvis. IMPRESSION: 1. Hyperdense material within the gallbladder, could reflect a mixture of biliary stones and sludge without features of acute cholecystitis. If there is further concern, right upper quadrant ultrasound could be obtained. 2. Colonic diverticulosis without evidence of diverticulitis. 3. No other acute or suspicious abnormality to provide cause for patient's symptoms. 4. Aortic Atherosclerosis (ICD10-I70.0). Electronically Signed   By: Kreg Shropshire M.D.   On: 04/03/2020 15:06   DG Chest Port 1 View  Result Date: 04/04/2020 CLINICAL DATA:  Leukocytosis, weakness, nausea, vomiting EXAM: PORTABLE CHEST 1 VIEW COMPARISON:  Portable exam 1602 hours compared to 03/14/2012 FINDINGS: Normal heart size, mediastinal contours, and pulmonary vascularity. Rotated to the RIGHT. Mild RIGHT basilar atelectasis. Lungs otherwise clear. No infiltrate, pleural effusion or pneumothorax. IMPRESSION: Mild RIGHT basilar atelectasis. Electronically Signed   By: Ulyses Southward M.D.   On: 04/04/2020 16:15   US ABDOMEN LIMITED RUQ  Result Date: 04/03/2020 CLINICAL DATA:  Epigastric abdominal pain, nausea, vomiting EXAM: ULTRASOUND ABDOMEN LIMITED RIGHT UPPER QUADRANT COMPARISON:  CT 2:43 p.m. FINDINGS: Gallbladder: No gallstones or wall thickening visualized. No sonographic Murphy sign noted by sonographer. Common bile duct: Diameter: 4 mm in proximal diameter. The distal duct is obscured by overlying bowel gas. Liver: No focal lesion identified. Within normal limits in parenchymal echogenicity. Portal vein is patent on color Doppler imaging with normal direction of blood flow towards the liver. Other: No ascites IMPRESSION: Normal right upper quadrant sonogram. Electronically Signed   By: Helyn Numbers MD   On: 04/03/2020 18:51     emergent endoscopy on 04/05/2020 which showed giant gastric ulcer with active bleeding status post epi, coagulation, and hemostatic clip and  spray for hemostasis. Noted small nonbleeding gastric ulcers, large volume dense clots in the proximal stomach.   Repeat EGD on 04/08/2020 Impression: - Normal esophagus. - Non-bleeding gastric ulcers with no stigmata of  bleeding. - Large ulcer with adherent clot overlying the  previously treated visible vessel. While there was  no active bleeding, the size and appearance of the  ulcer are still at elevated risk of future rebleed.  Will plan to continue treating with high dose PPI  and adding Carafate as below. In the event of a  rebleed, there is a clip at the ulcer edge that can  be used as a radiographic target, and would consult  IR for embolization. Otherwise, will continue with  medical management. - Gastritis. - Normal first portion of the duodenum and second  portion of the duodenum. - Erythematous duodenopathy. - No specimens collected. Recommendation: - Return patient to hospital ward for ongoing care. - Full liquid diet now, and advance as tolerated to  previous diet. - Continue present medications. - Use Protonix (pantoprazole) 40 mg PO BID for 8  weeks. - Use sucralfate suspension 1 gram PO QID for 4  weeks. - Repeat upper endoscopy in 8 weeks to check   healing. - Return to GI clinic at appointment to be  scheduled. - Continue avoiding NSAIDs. - Continue H pylori antimicrobial therapy  (quadruple therapy x14 days total). - Repeat CBC 7-10 days after hospital discharge. - Please do not hesitate to  contact the GI service  with additional questions or concerns.   Subjective: Patient seen and examined at bedside.  Denies worsening shortness breath, vomiting, fever, black or bloody stools.  Discharge Exam: Vitals:   04/10/20 0437 04/10/20 0744  BP: (!) 139/66 (!) 135/64  Pulse: 70 69  Resp: 17 16  Temp: 98.5 F (36.9 C) 98.6 F (37 C)  SpO2: 99% 99%    General exam: No distress. Respiratory system: Bilateral decreased breath sounds at bases with some scattered crackles  cardiovascular system: S1-S2 heard, rate controlled Gastrointestinal system: Abdomen is nondistended, soft and nontender.  Normal bowel sounds heard  extremities: Mild lower extremity edema present.  No cyanosis    The results of significant diagnostics from this hospitalization (including imaging, microbiology, ancillary and laboratory) are listed below for reference.     Microbiology: Recent Results (from the past 240 hour(s))  SARS Coronavirus 2 by RT PCR (hospital order, performed in [facility] hospital lab) Nasopharyngeal Nasopharyngeal Swab     Status: None   Collection Time: 04/03/20 10:29 PM   Specimen: Nasopharyngeal Swab  Result Value Ref Range Status   SARS Coronavirus 2 NEGATIVE NEGATIVE Final    Comment: (NOTE) SARS-CoV-2 target nucleic acids are NOT DETECTED.  The SARS-CoV-2 RNA is generally detectable in upper and lower respiratory specimens during the acute phase of infection. The lowest concentration of  SARS-CoV-2 viral copies this assay can detect is 250 copies / mL. A negative result does not preclude SARS-CoV-2 infection and should not be used as the sole basis for treatment or other patient management decisions.  A negative result may occur with improper specimen collection / handling, submission of specimen other than nasopharyngeal swab, presence of viral mutation(s) within the areas targeted by this assay, and inadequate number of viral copies (<250 copies / mL). A negative result must be combined with clinical observations, patient history, and epidemiological information.  Fact Sheet for Patients:   BoilerBrush.com.cy  Fact Sheet for Healthcare Providers: https://pope.com/  This test is not yet approved or  cleared by the [place] FDA and has been authorized for detection and/or diagnosis of SARS-CoV-2 by FDA under an Emergency Use Authorization (EUA).  This EUA will remain in effect (meaning this test can be used) for the duration of the COVID-19 declaration under Section 564(b)(1) of the Act, 21 U.S.C. section 360bbb-3(b)(1), unless the authorization is terminated or revoked sooner.  Performed at [facility], [address] [place], [place] [address]   Culture, Urine     Status: Abnormal   Collection Time: 04/04/20  3:16 PM   Specimen: Urine, Random  Result Value Ref Range Status   Specimen Description URINE, RANDOM  Final   Special Requests   Final    NONE Performed at [facility], [address] [place], [place] [address]    Culture (A)  Final    30,000 COLONIES/mL MULTIPLE SPECIES PRESENT, SUGGEST RECOLLECTION   Report Status 04/05/2020 FINAL  Final  Culture, blood (routine x 2)     Status: None (Preliminary result)   Collection Time: 04/05/20  6:20 AM   Specimen: BLOOD  Result Value Ref Range Status   Specimen Description BLOOD RIGHT ANTECUBITAL  Final   Special Requests   Final    BOTTLES  DRAWN AEROBIC AND ANAEROBIC Blood Culture adequate volume   Culture   Final    NO GROWTH 4 DAYS Performed at [facility], [address] [place], [place] [address]  Report Status PENDING  Incomplete  Culture, blood (routine x 2)     Status: None (Preliminary result)   Collection Time: 04/05/20  6:25 AM   Specimen: BLOOD RIGHT HAND  Result Value Ref Range Status   Specimen Description BLOOD RIGHT HAND  Final   Special Requests   Final    BOTTLES DRAWN AEROBIC AND ANAEROBIC Blood Culture adequate volume   Culture   Final    NO GROWTH 4 DAYS Performed at [facility], [address] [place], [place] [address]    Report Status PENDING  Incomplete  SARS Coronavirus 2 by RT PCR (hospital order, performed in [facility] hospital lab) Nasopharyngeal Nasopharyngeal Swab     Status: None   Collection Time: 04/09/20 10:49 AM   Specimen: Nasopharyngeal Swab  Result Value Ref Range Status   SARS Coronavirus 2 NEGATIVE NEGATIVE Final    Comment: (NOTE) SARS-CoV-2 target nucleic acids are NOT DETECTED.  The SARS-CoV-2 RNA is generally detectable in upper and lower respiratory specimens during the acute phase of infection. The lowest concentration of SARS-CoV-2 viral copies this assay can detect is 250 copies / mL. A negative result does not preclude SARS-CoV-2 infection and should not be used as the sole basis for treatment or other patient management decisions.  A negative result may occur with improper specimen collection / handling, submission of specimen other than nasopharyngeal swab, presence of viral mutation(s) within the areas targeted by this assay, and inadequate number of viral copies (<250 copies / mL). A negative result must be combined with clinical observations, patient history, and epidemiological information.  Fact Sheet for Patients:   BoilerBrush.com.cyhttps://www.fda.gov/media/136312/download  Fact Sheet for Healthcare  Providers: https://pope.com/https://www.fda.gov/media/136313/download  This test is not yet approved or  cleared by the [place] FDA and has been authorized for detection and/or diagnosis of SARS-CoV-2 by FDA under an Emergency Use Authorization (EUA).  This EUA will remain in effect (meaning this test can be used) for the duration of the COVID-19 declaration under Section 564(b)(1) of the Act, 21 U.S.C. section 360bbb-3(b)(1), unless the authorization is terminated or revoked sooner.  Performed at [facility], [address] [place], [place] [address]      Labs: BNP (last 3 results) No results for input(s): BNP in the last 8760 hours. Basic Metabolic Panel: Recent Labs  Lab 04/06/20 0611 04/07/20 0622 04/08/20 1133 04/09/20 0358 04/10/20 0614  NA 138 138 139 140 141  K 3.9 3.3* 3.2* 3.2* 3.8  CL 107 107 110 109 110  CO2 22 24 22 25 23   GLUCOSE 119* 89 120* 100* 97  BUN 36* 28* 14 9 6*  CREATININE 1.16* 1.13* 1.02* 0.94 0.98  CALCIUM 8.1* 7.9* 8.2* 8.2* 8.3*  MG  --   --   --  1.7 1.6*   Liver Function Tests: Recent Labs  Lab 04/03/20 1355 04/04/20 0442  AST 58* 43*  ALT 23 20  ALKPHOS 56 47  BILITOT 1.9* 0.6  PROT 8.0 6.3*  ALBUMIN 3.0* 2.5*   Recent Labs  Lab 04/03/20 1355  LIPASE 39   No results for input(s): AMMONIA in the last 168 hours. CBC: Recent Labs  Lab 04/07/20 0622 04/07/20 1312 04/08/20 1133 04/09/20 0358 04/10/20 0614  WBC 9.1 9.6 7.3 6.7 6.5  NEUTROABS 5.4 6.0 5.1 4.1 4.3  HGB 7.7* 9.4* 9.6* 8.4* 9.2*  HCT 23.5* 29.0* 29.2* 26.6* 28.8*  MCV 89.4 89.8 90.7 90.8 93.5  PLT 163 165 178 179 173  Cardiac Enzymes: No results for input(s): CKTOTAL, CKMB, CKMBINDEX, TROPONINI in the last 168 hours. BNP: Invalid input(s): POCBNP CBG: Recent Labs  Lab 04/03/20 1257 04/08/20 2044 04/09/20 0832 04/09/20 1200 04/09/20 1712  GLUCAP 157* 105* 97 99 91   D-Dimer No results for input(s): DDIMER in the last 72 hours. Hgb A1c No results  for input(s): HGBA1C in the last 72 hours. Lipid Profile No results for input(s): CHOL, HDL, LDLCALC, TRIG, CHOLHDL, LDLDIRECT in the last 72 hours. Thyroid function studies No results for input(s): TSH, T4TOTAL, T3FREE, THYROIDAB in the last 72 hours.  Invalid input(s): FREET3 Anemia work up No results for input(s): VITAMINB12, FOLATE, FERRITIN, TIBC, IRON, RETICCTPCT in the last 72 hours. Urinalysis    Component Value Date/Time   COLORURINE YELLOW 04/04/2020 0807   APPEARANCEUR CLOUDY (A) 04/04/2020 0807   LABSPEC 1.027 04/04/2020 0807   PHURINE 6.0 04/04/2020 0807   GLUCOSEU NEGATIVE 04/04/2020 0807   HGBUR SMALL (A) 04/04/2020 0807   BILIRUBINUR NEGATIVE 04/04/2020 0807   KETONESUR 5 (A) 04/04/2020 0807   PROTEINUR 30 (A) 04/04/2020 0807   NITRITE NEGATIVE 04/04/2020 0807   LEUKOCYTESUR LARGE (A) 04/04/2020 0807   Sepsis Labs Invalid input(s): PROCALCITONIN,  WBC,  LACTICIDVEN Microbiology Recent Results (from the past 240 hour(s))  SARS Coronavirus 2 by RT PCR (hospital order, performed in [facility] hospital lab) Nasopharyngeal Nasopharyngeal Swab     Status: None   Collection Time: 04/03/20 10:29 PM   Specimen: Nasopharyngeal Swab  Result Value Ref Range Status   SARS Coronavirus 2 NEGATIVE NEGATIVE Final    Comment: (NOTE) SARS-CoV-2 target nucleic acids are NOT DETECTED.  The SARS-CoV-2 RNA is generally detectable in upper and lower respiratory specimens during the acute phase of infection. The lowest concentration of SARS-CoV-2 viral copies this assay can detect is 250 copies / mL. A negative result does not preclude SARS-CoV-2 infection and should not be used as the sole basis for treatment or other patient management decisions.  A negative result may occur with improper specimen collection / handling, submission of specimen other than nasopharyngeal swab, presence of viral mutation(s) within the areas targeted by this assay, and inadequate number of viral  copies (<250 copies / mL). A negative result must be combined with clinical observations, patient history, and epidemiological information.  Fact Sheet for Patients:   BoilerBrush.com.cy  Fact Sheet for Healthcare Providers: https://pope.com/  This test is not yet approved or  cleared by the [place] FDA and has been authorized for detection and/or diagnosis of SARS-CoV-2 by FDA under an Emergency Use Authorization (EUA).  This EUA will remain in effect (meaning this test can be used) for the duration of the COVID-19 declaration under Section 564(b)(1) of the Act, 21 U.S.C. section 360bbb-3(b)(1), unless the authorization is terminated or revoked sooner.  Performed at [facility], [address], [place], [place] [address]   Culture, Urine     Status: Abnormal   Collection Time: 04/04/20  3:16 PM   Specimen: Urine, Random  Result Value Ref Range Status   Specimen Description URINE, RANDOM  Final   Special Requests   Final    NONE Performed at [facility], [address] [place], [place] [address]    Culture (A)  Final    30,000 COLONIES/mL MULTIPLE SPECIES PRESENT, SUGGEST RECOLLECTION   Report Status 04/05/2020 FINAL  Final  Culture, blood (routine x 2)     Status: None (Preliminary result)   Collection Time: 04/05/20  6:20 AM   Specimen: BLOOD  Result Value Ref Range Status   Specimen Description BLOOD RIGHT ANTECUBITAL  Final   Special Requests   Final    BOTTLES DRAWN AEROBIC AND ANAEROBIC Blood Culture adequate volume   Culture   Final    NO GROWTH 4 DAYS Performed at [facility], [address], [place], [place] [address]    Report Status PENDING  Incomplete  Culture, blood (routine x 2)     Status: None (Preliminary result)   Collection Time: 04/05/20  6:25 AM   Specimen: BLOOD RIGHT HAND  Result Value Ref Range Status   Specimen Description BLOOD RIGHT HAND  Final   Special Requests    Final    BOTTLES DRAWN AEROBIC AND ANAEROBIC Blood Culture adequate volume   Culture   Final    NO GROWTH 4 DAYS Performed at [facility], [address], [place], [place] [address]    Report Status PENDING  Incomplete  SARS Coronavirus 2 by RT PCR (hospital order, performed in [facility] hospital lab) Nasopharyngeal Nasopharyngeal Swab     Status: None   Collection Time: 04/09/20 10:49 AM   Specimen: Nasopharyngeal Swab  Result Value Ref Range Status   SARS Coronavirus 2 NEGATIVE NEGATIVE Final    Comment: (NOTE) SARS-CoV-2 target nucleic acids are NOT DETECTED.  The SARS-CoV-2 RNA is generally detectable in upper and lower respiratory specimens during the acute phase of infection. The lowest concentration of SARS-CoV-2 viral copies this assay can detect is 250 copies / mL. A negative result does not preclude SARS-CoV-2 infection and should not be used as the sole basis for treatment or other patient management decisions.  A negative result may occur with improper specimen collection / handling, submission of specimen other than nasopharyngeal swab, presence of viral mutation(s) within the areas targeted by this assay, and inadequate number of viral copies (<250 copies / mL). A negative result must be combined with clinical observations, patient history, and epidemiological information.  Fact Sheet for Patients:   BoilerBrush.com.cy  Fact Sheet for Healthcare Providers: https://pope.com/  This test is not yet approved or  cleared by the [place] FDA and has been authorized for detection and/or diagnosis of SARS-CoV-2 by FDA under an Emergency Use Authorization (EUA).  This EUA will remain in effect (meaning this test can be used) for the duration of the COVID-19 declaration under Section 564(b)(1) of the Act, 21 U.S.C. section 360bbb-3(b)(1), unless the authorization is terminated or revoked sooner.  Performed at  [facility], [address] [place], [place] [address]      Time coordinating discharge: 35 minutes  SIGNED:   Glade Lloyd, MD  [facility] 04/10/2020, 11:37 AM

## 2020-04-10 NOTE — TOC Transition Note (Addendum)
Transition of Care [facility]) - CM/SW Discharge Note   Patient Details  Name: Susan Fernandez MRN: [id number] Date of Birth: August 22, 1946  Transition of Care [facility]) CM/SW Contact:  Terrial Rhodes, LCSWA Phone Number: 04/10/2020, 11:36 AM   Clinical Narrative:      Patient will DC to: [facility]   Anticipated DC date: 04/10/2020  Family notified: [place]   Transport by: Sharin Mons  ?  Per MD patient ready for DC to [facility] . RN, patient, patient's family, and facility notified of DC. Discharge Summary sent to facility. RN given number for report tele#[phone] RM# [phone]. DC packet on chart. Ambulance transport requested for patient.  CSW signing off.  Final next level of care: Skilled Nursing Facility Barriers to Discharge: No Barriers Identified   Patient Goals and CMS Choice Patient states their goals for this hospitalization and ongoing recovery are:: to go to SNF CMS Medicare.gov Compare Post Acute Care list provided to:: Patient Choice offered to / list presented to : Patient  Discharge Placement              Patient chooses bed at: [facility] Patient to be transferred to facility by: PTAR Name of family member notified: [place] (Niece) Patient and family notified of of transfer: 04/10/20  Discharge Plan and Services                                     Social Determinants of Health (SDOH) Interventions     Readmission Risk Interventions No flowsheet data found.

## 2020-04-10 NOTE — Progress Notes (Signed)
Physical Therapy Treatment Patient Details Name: Susan Fernandez MRN: [id number] DOB: July 17, 1946 Today's Date: 04/10/2020    History of Present Illness Pt is a 74 yr old woman who presentes with complaints of weakness, nausea and vomiting. She states that this has been ongoing for 2 weeks. Pt additionally found to be anemic and with GIB due to large ulcers.  Pt's hgb was 6.1 this am but she has received 2 units PRBC.  Past medical history significant for pre-diabetes, Venous insufficiency, GERD, Hyperlipidemia, Hypertension, morbid obesity, history of right thalamic infarction Feb 2021, and CKD III.    PT Comments    Pt was seen for follow up to therapy in which pt had not yet started walking or doing much with transfers. Today she was able to do sidestepping, was up to control standing balance and worked on skills for standing with UE support.  Her plan is to progress gait and balance to reduce time needed to stay in rehab. Pt was at independent level at home so is anticipated to have an excellent recovery from her weakness.  Pt is motivated to get to rehab and then home, and will work acutely on the goals of therapy to reduce time needed to stay in rehab.    Follow Up Recommendations  SNF     Equipment Recommendations  3in1 (PT)    Recommendations for Other Services       Precautions / Restrictions Precautions Precautions: Fall Precaution Comments: low endurance Restrictions Weight Bearing Restrictions: No    Mobility  Bed Mobility Overal bed mobility: Needs Assistance Bed Mobility: Supine to Sit;Sit to Supine     Supine to sit: Min guard Sit to supine: Min guard      Transfers Overall transfer level: Needs assistance Equipment used: Rolling walker (2 wheeled) Transfers: Sit to/from Stand Sit to Stand: Min assist         General transfer comment: min assist with minor cues for hand placement  Ambulation/Gait Ambulation/Gait assistance: Min assist Gait  Distance (Feet): 8 Feet Assistive device: Rolling walker (2 wheeled) Gait Pattern/deviations: Step-to pattern;Wide base of support Gait velocity: decreased Gait velocity interpretation: <1.31 ft/sec, indicative of household ambulator General Gait Details: pt did sidestepping, used RW with bed support and was    Social research officer, government Rankin (Stroke Patients Only)       Balance Overall balance assessment: Needs assistance Sitting-balance support: No upper extremity supported Sitting balance-Leahy Scale: Good     Standing balance support: Bilateral upper extremity supported Standing balance-Leahy Scale: Fair Standing balance comment: less than fair dynamically                            Cognition Arousal/Alertness: Awake/alert Behavior During Therapy: Anxious Overall Cognitive Status: Within Functional Limits for tasks assessed                                 General Comments: has very limited tolerance for mobility      Exercises      General Comments General comments (skin integrity, edema, etc.): continues to have LE numbness but is not impacting her stability on RW.        Pertinent Vitals/Pain Pain Assessment: No/denies pain    Home Living  Prior Function            PT Goals (current goals can now be found in the care plan section) Acute Rehab PT Goals Patient Stated Goal: to go home Progress towards PT goals: Progressing toward goals    Frequency    Min 3X/week      PT Plan Current plan remains appropriate    Co-evaluation              AM-PAC PT "6 Clicks" Mobility   Outcome Measure  Help needed turning from your back to your side while in a flat bed without using bedrails?: None Help needed moving from lying on your back to sitting on the side of a flat bed without using bedrails?: None Help needed moving to and from a bed to a chair (including  a wheelchair)?: A Little Help needed standing up from a chair using your arms (e.g., wheelchair or bedside chair)?: A Little Help needed to walk in hospital room?: A Little Help needed climbing 3-5 steps with a railing? : A Lot 6 Click Score: 19    End of Session Equipment Utilized During Treatment: Gait belt Activity Tolerance: Patient limited by fatigue;Treatment limited secondary to medical complications (Comment) Patient left: in bed;with call bell/phone within reach;with nursing/sitter in room;with bed alarm set Nurse Communication: Mobility status PT Visit Diagnosis: Dizziness and giddiness (R42);Other abnormalities of gait and mobility (R26.89);Muscle weakness (generalized) (M62.81)     Time: 9937-1696 PT Time Calculation (min) (ACUTE ONLY): 25 min  Charges:  $Gait Training: 8-22 mins $Therapeutic Activity: 8-22 mins                     Ivar Drape 04/10/2020, 1:16 PM  Samul Dada, PT MS Acute Rehab Dept. Number: [facility] [id number] and [facility] [phone]

## 2020-04-10 NOTE — Progress Notes (Signed)
Report called to Associate Professor at [place].  Patient belongings packed and advised of transfer.  Awaiting PTAR for d/c to [facility].

## 2020-04-13 ENCOUNTER — Encounter: Payer: Self-pay | Admitting: Adult Health

## 2020-04-13 ENCOUNTER — Non-Acute Institutional Stay (SKILLED_NURSING_FACILITY): Payer: Medicare PPO | Admitting: Adult Health

## 2020-04-13 DIAGNOSIS — E669 Obesity, unspecified: Secondary | ICD-10-CM | POA: Diagnosis not present

## 2020-04-13 DIAGNOSIS — N179 Acute kidney failure, unspecified: Secondary | ICD-10-CM | POA: Diagnosis not present

## 2020-04-13 DIAGNOSIS — D62 Acute posthemorrhagic anemia: Secondary | ICD-10-CM

## 2020-04-13 DIAGNOSIS — R5381 Other malaise: Secondary | ICD-10-CM

## 2020-04-13 DIAGNOSIS — K250 Acute gastric ulcer with hemorrhage: Secondary | ICD-10-CM

## 2020-04-13 DIAGNOSIS — I951 Orthostatic hypotension: Secondary | ICD-10-CM

## 2020-04-13 LAB — BASIC METABOLIC PANEL
BUN: 4 (ref 4–21)
CO2: 24 — AB (ref 13–22)
Chloride: 104 (ref 99–108)
Creatinine: 0.9 (ref 0.5–1.1)
Glucose: 95
Potassium: 3.3 — AB (ref 3.4–5.3)
Sodium: 140 (ref 137–147)

## 2020-04-13 LAB — COMPREHENSIVE METABOLIC PANEL
Calcium: 8.5 — AB (ref 8.7–10.7)
GFR calc Af Amer: 71.2
GFR calc non Af Amer: 61.44

## 2020-04-13 LAB — CBC AND DIFFERENTIAL
HCT: 30 — AB (ref 36–46)
Hemoglobin: 9.7 — AB (ref 12.0–16.0)
Neutrophils Absolute: 7
Platelets: 183 (ref 150–399)
WBC: 6.1

## 2020-04-13 LAB — CBC: RBC: 3.33 — AB (ref 3.87–5.11)

## 2020-04-13 NOTE — Progress Notes (Signed)
Location:  [facility] Room Number: 304-B Place of Service:  SNF (31) Provider:  Kenard Gower, DNP, FNP-BC  Patient Care Team: [name], MD as PCP - General (Internal Medicine) Luciana Axe Alford Highland, MD as Consulting Physician (Ophthalmology)  Extended Emergency Contact Information Primary Emergency Contact: Hunt Oris Home Phone: [phone] Mobile Phone: [phone] Relation: Brother Secondary Emergency Contact: Lins,Richard Address: [address]          [place], [place] [address] [place] of [place] Home Phone: [phone] Mobile Phone: [phone] Relation: Other  Code Status:  FULL CODE  Goals of care: Advanced Directive information Advanced Directives 04/06/2020  Does Patient Have a Medical Advance Directive? Yes  Type of Estate agent of [place];Living will  Does patient want to make changes to medical advance directive? No - Patient declined  Copy of Healthcare Power of Attorney in Chart? No - copy requested  Would patient like information on creating a medical advance directive? -     Chief Complaint  Patient presents with  . Acute Visit    Patient is seen for hospital followp, status post hospitalization at [facility] 7/23-7/30/21 for nausea and vomiting.     HPI:  Pt is a 74 y.o. female seen today for hospital follow-up.  She was admitted to [facility] on 04/10/20 for short-term rehabilitation following hospitalization at [facility] 7/23 to 04/10/2020.  She has PMH of prediabetes, venous insufficiency, GERD, hyperlipidemia, hypertension, right thalamic infarction and chronic kidney disease stage III.  She presented to the ED with worsening weakness, nausea and vomiting for 2 weeks.  She was found to be orthostatic hypotensive with hemoglobin of 8.6.  CT of the abdomen showed a gallbladder sludge/stones.  Right upper quadrant ultrasound was normal.  GI was consulted.  She had  coffee-ground emesis on 04/05/2020.  EGD was done on 04/05/2020 which showed large gastric ulcer with active bleeding S/P epi, coagulation and hemostatic clip and spray for hemostasis. Repeat EGD on 04/08/2020 showed normal esophagus, non-bleeding gastric ulcers with no stigmata or bleeding. Large ulcer with adherent clot overlying the previously treated visible vessel. The size and appearance of the ulcer are still at elevated risk for future re-bleed. GI recommended Protonix 40 mg BID for [redacted] weeks along with sucralfate suspension 1 g PO QID X 4 weeks. She will be needing quadruple therapy for H. Pylori for a total of 2 weeks. She will follow up with GI for a repeat endoscopy in 8 weeks to check healing. Hospital stay was complicated by hemoglobin drop from 12 to 7.1. She was transfused 4 units PRBC. Hgb at hospital discharge was 9.2.   She was seen in the room today. Discussed medications and which gastroenterologist and when she needs to follow up. She denies having nausea nor vomiting.   Past Medical History:  Diagnosis Date  . Cataract   . Complication of anesthesia    Difficult to wake  . GERD (gastroesophageal reflux disease)   . Hyperlipidemia   . Hypertension 1995  . IBS (irritable bowel syndrome)   . Morbid obesity (HCC)   . 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 Posterior vitreous detachment 10/29/2015  . Prediabetes 2016  . Right thalamic infarction (HCC) 11/03/2019   Lacunar R thalamic infarction without hemorrhage or shift per MRI 11/03/2019  . Venous insufficiency    Past Surgical History:  Procedure Laterality Date  . CATARACT EXTRACTION, BILATERAL Bilateral 2018   L- 01/2017, R-02/2017  . ESOPHAGOGASTRODUODENOSCOPY (EGD) WITH  PROPOFOL N/A 04/05/2020   Procedure: ESOPHAGOGASTRODUODENOSCOPY (EGD) WITH PROPOFOL;  Surgeon: Hilarie FredricksonPerry, John N, MD;  [facility];  Service: Endoscopy;  Laterality: N/A;  . ESOPHAGOGASTRODUODENOSCOPY (EGD) WITH PROPOFOL N/A 04/08/2020   Procedure:  ESOPHAGOGASTRODUODENOSCOPY (EGD) WITH PROPOFOL;  Surgeon: Shellia Cleverlyirigliano, Vito V, DO;  Location: [facility] ENDOSCOPY;  Service: Gastroenterology;  Laterality: N/A;  . HEMOSTASIS CLIP PLACEMENT  04/05/2020   Procedure: HEMOSTASIS CLIP PLACEMENT;  Surgeon: Hilarie FredricksonPerry, John N, MD;  [facility];  Service: Endoscopy;;  . HEMOSTASIS CONTROL  04/05/2020   Procedure: HEMOSTASIS CONTROL;  Surgeon: Hilarie FredricksonPerry, John N, MD;  [facility];  Service: Endoscopy;;  . HOT HEMOSTASIS N/A 04/05/2020   Procedure: HOT HEMOSTASIS (ARGON PLASMA COAGULATION/BICAP);  Surgeon: Hilarie FredricksonPerry, John N, MD;  [facility];  Service: Endoscopy;  Laterality: N/A;  . RETINAL TEAR REPAIR CRYOTHERAPY Bilateral 2000   Dr. Cecilie KicksJacklin  . SUBMUCOSAL INJECTION  04/05/2020   Procedure: SUBMUCOSAL INJECTION;  Surgeon: Hilarie FredricksonPerry, John N, MD;  [facility];  Service: Endoscopy;;    Allergies  Allergen Reactions  . Ppd [Tuberculin Purified Protein Derivative]   . Sulfa Antibiotics Other (See Comments)    Passed out  . Tolectin [Tolmetin]     Outpatient Encounter Medications as of 04/13/2020  Medication Sig  . atorvastatin (LIPITOR) 80 MG tablet Take 1 tablet (80 mg total) by mouth every evening.  . bismuth subsalicylate (PEPTO BISMOL) 262 MG chewable tablet Chew 2 tablets (524 mg total) by mouth 4 (four) times daily for 12 days.  Marland Kitchen. doxycycline (VIBRA-TABS) 100 MG tablet Take 1 tablet (100 mg total) by mouth every 12 (twelve) hours for 12 days.  . metroNIDAZOLE (FLAGYL) 250 MG tablet Take 1 tablet (250 mg total) by mouth every 6 (six) hours for 12 days.  . pantoprazole (PROTONIX) 40 MG tablet Take 1 tablet (40 mg total) by mouth 2 (two) times daily before a meal.  . sucralfate (CARAFATE) 1 GM/10ML suspension Take 10 mLs (1 g total) by mouth 4 (four) times daily -  with meals and at bedtime.   No facility-administered encounter medications on file as of 04/13/2020.    Review of Systems  GENERAL: No change in appetite, no fatigue,  no weight changes, no fever or chills  MOUTH and THROAT: Denies oral discomfort, gingival pain or bleeding, pain from teeth or hoarseness   RESPIRATORY: no cough, SOB, DOE, wheezing, hemoptysis CARDIAC: No chest pain, edema or palpitations GI: No abdominal pain, diarrhea, constipation, heart burn, nausea or vomiting GU: Denies dysuria, frequency, hematuria, incontinence, or discharge NEUROLOGICAL: Denies dizziness, syncope, numbness, or headache PSYCHIATRIC: Denies feelings of depression or anxiety. No report of hallucinations, insomnia, paranoia, or agitation   Immunization History  Administered Date(s) Administered  . PFIZER SARS-COV-2 Vaccination 01/03/2020, 01/24/2020   Pertinent  Health Maintenance Due  Topic Date Due  . COLONOSCOPY  Never done  . MAMMOGRAM  06/25/2017  . DEXA SCAN  Completed  . INFLUENZA VACCINE  Discontinued  . PNA vac Low Risk Adult  Discontinued   Fall Risk  06/15/2018 11/15/2016 11/03/2015 04/07/2015  Falls in the past year? No No No Yes  Number falls in past yr: - - - 1  Injury with Fall? - - - No  Risk for fall due to : - - - History of fall(s);Other (Comment)  Risk for fall due to: Comment - - - dehydration  Follow up - - - Falls prevention discussed;Education provided     Vitals:   04/13/20 1439  BP: 134/63  Pulse: 66  Resp: 18  Temp: 97.7 F (36.5 C)  TempSrc: Oral  Weight: 227 lb 15.4 oz (103.4 kg)  Height:  (1.753 m)   Body mass index is 33.66 kg/m.  Physical Exam  GENERAL APPEARANCE: Well nourished. In no acute distress. Obese SKIN:  Skin is warm and dry.  MOUTH and THROAT: Lips are without lesions. Oral mucosa is moist and without lesions.  RESPIRATORY: Breathing is even & unlabored, BS CTAB CARDIAC: RRR, no murmur,no extra heart sounds, no edema GI: Abdomen soft, normal BS, no masses, no tenderness EXTREMITIES:  Able to move X 4 extremities NEUROLOGICAL: There is no tremor. Speech is clear. Alert and oriented X  3. PSYCHIATRIC:  Affect and behavior are appropriate   Labs reviewed: Recent Labs    04/08/20 1133 04/09/20 0358 04/10/20 0614  NA 139 140 141  K 3.2* 3.2* 3.8  CL 110 109 110  CO2 GLUCOSE 120* 100* 97  BUN 14 9 6*  CREATININE 1.02* 0.94 0.98  CALCIUM 8.2* 8.2* 8.3*  MG  --  1.7 1.6*   Recent Labs    11/03/19 1207 11/03/19 1207 11/19/19 1108 04/03/20 1355 04/04/20 0442  AST 17   < > 19 58* 43*  ALT 12   < > ALKPHOS 64  --   --  56 47  BILITOT 0.5   < > 0.4 1.9* 0.6  PROT 7.3   < > 7.6 8.0 6.3*  ALBUMIN 3.1*  --   --  3.0* 2.5*   < > = values in this interval not displayed.   Recent Labs    04/08/20 1133 04/09/20 0358 04/10/20 0614  WBC 7.3 6.7 6.5  NEUTROABS 5.1 4.1 4.3  HGB 9.6* 8.4* 9.2*  HCT 29.2* 26.6* 28.8*  MCV 90.7 90.8 93.5  PLT 178 179 173   Lab Results  Component Value Date   TSH 0.99 11/19/2019   Lab Results  Component Value Date   HGBA1C 5.9 (H) 11/03/2019   Lab Results  Component Value Date   CHOL 292 (H) 11/03/2019   HDL 40 (L) 11/03/2019   LDLCALC 230 (H) 11/03/2019   TRIG 111 11/03/2019   CHOLHDL 7.3 11/03/2019    Significant Diagnostic Results in last 30 days:  CT ABDOMEN PELVIS WO CONTRAST  Result Date: 04/03/2020 CLINICAL DATA:  Nausea, vomiting EXAM: CT ABDOMEN AND PELVIS WITHOUT CONTRAST TECHNIQUE: Multidetector CT imaging of the abdomen and pelvis was performed following the standard protocol without IV contrast. COMPARISON:  None FINDINGS: Lower chest: Bandlike atelectasis in the right lung base with elevation of the right hemidiaphragm. Lung bases otherwise clear. Normal heart size. No pericardial effusion. Coronary artery and aortic leaflet calcifications are present. Hepatobiliary: Subcentimeter hyper in focus at the dome of the liver too small to fully characterize on CT imaging but statistically likely benign (3/8). No visible concerning liver lesion. Smooth surface contour. Normal attenuation.  Dependently layering hyperdense material within the gallbladder could reflect a mixture of stones and sludge. No pericholecystic fluid or inflammation. No ductal dilatation or visible intraductal gallstones. Pancreas: Unremarkable. No pancreatic ductal dilatation or surrounding inflammatory changes. Spleen: Normal in size. No concerning splenic lesions. Adrenals/Urinary Tract: Normal adrenal glands. Kidneys are normally located and symmetric in size without visible concerning renal lesion. Urolithiasis or hydronephrosis. Urinary bladder is unremarkable. Stomach/Bowel: Distal esophagus, stomach and duodenal sweep are unremarkable. No small bowel wall thickening or dilatation. No evidence of obstruction. A normal appendix  is visualized. No colonic dilatation or wall thickening. Scattered colonic diverticula without focal inflammation to suggest diverticulitis. Vascular/Lymphatic: Atherosclerotic calcifications within the abdominal aorta and branch vessels. No aneurysm or ectasia. No enlarged abdominopelvic lymph nodes. Reproductive: Mildly retroverted uterus. No concerning adnexal lesions. Other: No abdominopelvic free fluid or free gas. No bowel containing hernias. Musculoskeletal: Mild rightward curvature of the thoracolumbar spine with a degree of pelvic tilt. No acute or worrisome osseous abnormalities. Multilevel flowing anterior osteophytosis in the lower thoracic spine, compatible with features of diffuse idiopathic skeletal hyperostosis (DISH). Multilevel degenerative changes are present in the imaged portions of the spine. Additional degenerative changes in the hips and pelvis. IMPRESSION: 1. Hyperdense material within the gallbladder, could reflect a mixture of biliary stones and sludge without features of acute cholecystitis. If there is further concern, right upper quadrant ultrasound could be obtained. 2. Colonic diverticulosis without evidence of diverticulitis. 3. No other acute or suspicious  abnormality to provide cause for patient's symptoms. 4. Aortic Atherosclerosis (ICD10-I70.0). Electronically Signed   By: Kreg Shropshire M.D.   On: 04/03/2020 15:06   DG Chest Port 1 View  Result Date: 04/04/2020 CLINICAL DATA:  Leukocytosis, weakness, nausea, vomiting EXAM: PORTABLE CHEST 1 VIEW COMPARISON:  Portable exam 1602 hours compared to 03/14/2012 FINDINGS: Normal heart size, mediastinal contours, and pulmonary vascularity. Rotated to the RIGHT. Mild RIGHT basilar atelectasis. Lungs otherwise clear. No infiltrate, pleural effusion or pneumothorax. IMPRESSION: Mild RIGHT basilar atelectasis. Electronically Signed   By: Ulyses Southward M.D.   On: 04/04/2020 16:15   US ABDOMEN LIMITED RUQ  Result Date: 04/03/2020 CLINICAL DATA:  Epigastric abdominal pain, nausea, vomiting EXAM: ULTRASOUND ABDOMEN LIMITED RIGHT UPPER QUADRANT COMPARISON:  CT 2:43 p.m. FINDINGS: Gallbladder: No gallstones or wall thickening visualized. No sonographic Murphy sign noted by sonographer. Common bile duct: Diameter: 4 mm in proximal diameter. The distal duct is obscured by overlying bowel gas. Liver: No focal lesion identified. Within normal limits in parenchymal echogenicity. Portal vein is patent on color Doppler imaging with normal direction of blood flow towards the liver. Other: No ascites IMPRESSION: Normal right upper quadrant sonogram. Electronically Signed   By: Helyn Numbers MD   On: 04/03/2020 18:51    Assessment/Plan  1. Acute gastric ulcer with hemorrhage - EGD was done on 04/05/2020 which showed large gastric ulcer with active bleeding S/P epi, coagulation and hemostatic clip and spray for hemostasis. Repeat EGD on 04/08/2020 showed normal esophagus, non-bleeding gastric ulcers with no stigmata or bleeding. Large ulcer with adherent clot overlying the previously treated visible vessel. The size and appearance of the ulcer are still at elevated risk for future re-bleed. GI recommended Protonix 40 mg BID for [redacted]  weeks along with sucralfate suspension 1 g PO QID X 4 weeks. She will be needing quadruple therapy for H. Pylori for a total of 2 weeks - follow up with GI, Dr. Doristine Locks, on 05/27/20  2. Acute blood loss anemia Lab Results  Component Value Date   WBC 6.5 04/10/2020   HGB 9.2 (L) 04/10/2020   HCT 28.8 (L) 04/10/2020   MCV 93.5 04/10/2020   PLT 173 04/10/2020    - S/P blood transfusion of 4 units PRBC -  Will re-check CBC  3. Orthostatic hypotension BP Readings from Last 3 Encounters:  04/13/20 134/63  04/10/20 (!) 131/74  12/05/19 (!) 157/86   - stable, not on any antihypertensive medications  4. Obesity (BMI 30.0-34.9) -  Discussed nutritional choices  5. AKI (acute kidney injury) (  HCC) Lab Results  Component Value Date   NA 141 04/10/2020   K 3.8 04/10/2020   CO2 23 04/10/2020   GLUCOSE 97 04/10/2020   BUN 6 (L) 04/10/2020   CREATININE 0.98 04/10/2020   CALCIUM 8.3 (L) 04/10/2020   GFRNONAA 58 (L) 04/10/2020   GFRAA >60 04/10/2020   - will re-check  6. Physical deconditioning - for PT and OT, for therapeutic strengthening exercises  7. Hypomagnesemia - Mg 1.6, 04/10/20, was replaced in the hospital - will re-check    Family/ staff Communication:  Discussed plan of care with resident and charge nurse.  Labs/tests ordered:  Mg  Goals of care:   Short-term care   Kenard Gower, DNP, MSN, FNP-BC [facility] [phone] (Monday-Friday 8:00 a.m. - 5:00 p.m.) [phone] (after hours)

## 2020-04-14 ENCOUNTER — Encounter: Payer: Self-pay | Admitting: Internal Medicine

## 2020-04-14 ENCOUNTER — Non-Acute Institutional Stay (SKILLED_NURSING_FACILITY): Payer: Medicare PPO | Admitting: Internal Medicine

## 2020-04-14 DIAGNOSIS — K250 Acute gastric ulcer with hemorrhage: Secondary | ICD-10-CM | POA: Diagnosis not present

## 2020-04-14 DIAGNOSIS — I10 Essential (primary) hypertension: Secondary | ICD-10-CM | POA: Diagnosis not present

## 2020-04-14 DIAGNOSIS — D62 Acute posthemorrhagic anemia: Secondary | ICD-10-CM | POA: Diagnosis not present

## 2020-04-14 DIAGNOSIS — N179 Acute kidney failure, unspecified: Secondary | ICD-10-CM | POA: Insufficient documentation

## 2020-04-14 DIAGNOSIS — R7303 Prediabetes: Secondary | ICD-10-CM | POA: Diagnosis not present

## 2020-04-14 LAB — HEMOGLOBIN A1C: Hemoglobin A1C: 5.5

## 2020-04-14 NOTE — Assessment & Plan Note (Signed)
8/2 hemoglobin 9.7/hematocrit 30.

## 2020-04-14 NOTE — Progress Notes (Signed)
NURSING HOME LOCATION:  [facility] ROOM NUMBER:  [id number]  CODE STATUS:  FULL CODE  PCP:  [name], MD  [address] [place] [address]  This is a comprehensive admission note to [facility] performed on this date less than 30 days from date of admission. Included are preadmission medical/surgical history; reconciled medication list; family history; social history and comprehensive review of systems.  Corrections and additions to the records were documented. Comprehensive physical exam was also performed. Additionally a clinical summary was entered for each active diagnosis pertinent to this admission in the Problem List to enhance continuity of care.  HPI: She was hospitalized 7/23-7/30/2021, admitted from home with progressive weakness, and nausea/vomiting x2 weeks. In the ED she was orthostatic and hemoglobin was 8.6.  CT of the abdomen/pelvis revealed a gallbladder with sludge/stones without acute cholecystitis.  Right upper quadrant ultrasound was normal.  LFTs were normal except for T bilirubin of 1.9 (suggests Gilbert's Syndrome); this returned to normal limits.  Lipase was normal.  Hemoglobin subsequently dropped to 7.1; after 4 units of packed red cells hemoglobin was 9.2. On 7/25 she exhibited 400 cc coffee-ground emesis; emergent EGD was performed that day revealing a large gastric ulcer with active bleeding.  Epinephrine was administered.  Additionally she had both clipping and hot hemostasis vascular procedures at EGD 04/05/2020 by Dr Marina Goodell.  EGD was repeated on 7/28 revealing the large ulcer with an adherent clot.  GI recommended Protonix 40 mg twice a day for [redacted] weeks along with sucralfate suspension 1 g 4 times daily x2 weeks.  Follow-up endoscopy was recommended in 8 weeks to evaluate healing. Hospital course was complicated by AKI on CKD.  Baseline creatinine was felt to be 1.3; creatinine was 1.59 at admission and 0.98 at discharge. Blood  pressure medicines were held;orthostatic hypotension did improve.  Hypomagnesemia was repleted. SNF placement for generalized deconditioning was recommended by PT/OT.  Past medical and surgical history: Includes history of lacunar right thalamic infarction 2/21; prediabetes; IBS; essential hypertension, dyslipidemia, and GERD.  She has had multiple ophthalmologic procedures for retinal tear and cataracts.  Social history: Nondrinker, never smoked.  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  Family history: Limited history updated.  There is no family history of heart attack or stroke.  Her mother had insulin-dependent diabetes and cancer of the uterus.   Review of systems: She is alert and intelligent and well-informed.  Her major concern is that her follow-up GI visit September 15 is in [facility].  She lives in [place] and will find it very difficult to travel to [facility] for that visit.  She denies any active GI symptoms or bleeding dyscrasias.  She does state that her feet and legs are numb up to the knees when she is supine.  This does affect ambulation initially when she tries to mobilize.  The symptoms are less severe or absent when up and active. She describes urinary frequency possibly related to her diuretic antihypertensive.  Constitutional: No fever, significant weight change, fatigue  Eyes: No redness, discharge, pain, vision change ENT/mouth: No nasal congestion, purulent discharge, earache, change in hearing, sore throat  Cardiovascular: No chest pain, palpitations, paroxysmal nocturnal dyspnea, claudication, edema  Respiratory: No cough, sputum production, hemoptysis, DOE, significant snoring, apnea Gastrointestinal: No heartburn, dysphagia, abdominal pain, nausea /vomiting, rectal bleeding, melena, change in bowels Genitourinary: No dysuria, hematuria, pyuria, incontinence, nocturia Musculoskeletal: No joint stiffness, joint swelling, weakness,  pain Dermatologic:  No rash, pruritus, change in appearance of skin Neurologic: No dizziness, headache, syncope, seizures Psychiatric: No significant anxiety, depression, insomnia, anorexia Endocrine: No change in hair/skin/nails, excessive thirst, excessive hunger, excessive urination  Hematologic/lymphatic: No significant bruising, lymphadenopathy, abnormal bleeding Allergy/immunology: No itchy/watery eyes, significant sneezing, urticaria, angioedema  Physical exam:  Pertinent or positive findings:Arcus senilis present.Maxilla is edentulous.  Second heart sound is slightly increased.  She has trace ankle edema.  Slight clubbing of the nailbeds is suggested.  Opposition to strength is fair in all extremities.  General appearance: Adequately nourished; no acute distress, increased work of breathing is present.   Lymphatic: No lymphadenopathy about the head, neck, axilla. Eyes: No conjunctival inflammation or lid edema is present. There is no scleral icterus. Ears:  External ear exam shows no significant lesions or deformities.   Nose:  External nasal examination shows no deformity or inflammation. Nasal mucosa are pink and moist without lesions, exudates Oral exam: Lips and gums are healthy appearing.There is no oropharyngeal erythema or exudate. Neck:  No thyromegaly, masses, tenderness noted.    Heart:  Normal rate and regular rhythm. S1 normal without gallop, murmur, click, rub.  Lungs: Chest clear to auscultation without wheezes, rhonchi, rales, rubs. Abdomen: Bowel sounds are normal.  Abdomen is soft and nontender with no organomegaly, hernias, masses. GU: Deferred  Extremities:  No cyanosis. Neurologic exam: Balance, Rhomberg, finger to nose testing could not be completed due to clinical state Skin: Warm & dry w/o tenting. No significant lesions or rash.  See clinical summary under each active problem in the Problem List with associated updated therapeutic plan

## 2020-04-14 NOTE — Assessment & Plan Note (Addendum)
FBS range 92-113; evening glucoses 92-109. No hypoglycemic meds indicated. Consider low dose Metformin if significant hyperglycemia ( > 150) present

## 2020-04-14 NOTE — Assessment & Plan Note (Addendum)
Triggers for ulcer predisposition discussed.Only caffeine  (tea) a potential factor. No active bleeding reported. She requests GI follow up in [place], not [place]. I'll notify Dr Marina Goodell.

## 2020-04-14 NOTE — Patient Instructions (Signed)
See assessment and plan under each diagnosis in the problem list and acutely for this visit 

## 2020-04-14 NOTE — Assessment & Plan Note (Signed)
8/2 creatinine 0.9, GFR 71.2 indicating stage IIb CKD

## 2020-04-14 NOTE — Assessment & Plan Note (Addendum)
Blood pressure is low normal off antihypertensives.  Blood pressure medicines were held due to Orthostatic hypotension in the context of the gastric ulcer bleeding.

## 2020-04-16 ENCOUNTER — Telehealth: Payer: Self-pay

## 2020-04-16 NOTE — Telephone Encounter (Signed)
Left message for pt to call back  °

## 2020-04-16 NOTE — Telephone Encounter (Signed)
-----   Message from Hilarie Fredrickson, MD sent at 04/16/2020  8:33 AM EDT ----- Hessie Dibble have her follow-up in [facility] sometime in the next couple of weeks.  She will need follow-up EGD with me in about 8 weeks on twice daily PPI.  [facility] can arrange at the time of her office evaluation.  Aviva Kluver ----- Message ----- From: Shellia Cleverly, DO Sent: 04/15/2020   4:35 PM EDT To: Hilarie Fredrickson, MD, Dossie Arbour, LPN  Can you please assist. I am happy to see the patient, but she cannot get out to the [facility] clinic. I am ok if she wants to be seen by one of the APPs in the [place] office and can staff with me.   JP, This is the lady with the really deep ulcer that you did hemospray last week. I am happy to help out any way I can in her ongoing care.  VC ----- Message ----- From: Pecola Lawless, MD Sent: 04/15/2020   3:02 PM EDT To: [name], MD, Hilarie Fredrickson, MD, #  She is requesting GI follow up be in [place] rather than in [facility] as she lives in [place]& has restricted travel issues. Thx, Hopp

## 2020-04-17 LAB — CBC AND DIFFERENTIAL
HCT: 31 — AB (ref 36–46)
Hemoglobin: 10.1 — AB (ref 12.0–16.0)
Neutrophils Absolute: 3
Platelets: 208 (ref 150–399)
WBC: 4.9

## 2020-04-17 LAB — CBC: RBC: 3.5 — AB (ref 3.87–5.11)

## 2020-04-17 NOTE — Telephone Encounter (Signed)
Unable to reach pt, no answer

## 2020-04-20 NOTE — Telephone Encounter (Signed)
Unable to reach pt

## 2020-04-21 NOTE — Telephone Encounter (Signed)
Pt currently in SNF, was seen recently by Dr. Alwyn Ren and pt requested to follow-up with GI in [place]. Pt is scheduled to see Dr. Marina Goodell 05/28/20 at 3:40pm. Please let pt know regarding the appt.please.

## 2020-04-23 ENCOUNTER — Encounter: Payer: Self-pay | Admitting: Adult Health

## 2020-04-23 ENCOUNTER — Other Ambulatory Visit: Payer: Self-pay | Admitting: Internal Medicine

## 2020-04-23 ENCOUNTER — Telehealth: Payer: Self-pay | Admitting: Internal Medicine

## 2020-04-23 ENCOUNTER — Non-Acute Institutional Stay (SKILLED_NURSING_FACILITY): Payer: Medicare PPO | Admitting: Adult Health

## 2020-04-23 DIAGNOSIS — E669 Obesity, unspecified: Secondary | ICD-10-CM

## 2020-04-23 DIAGNOSIS — I951 Orthostatic hypotension: Secondary | ICD-10-CM

## 2020-04-23 DIAGNOSIS — R5381 Other malaise: Secondary | ICD-10-CM

## 2020-04-23 DIAGNOSIS — E782 Mixed hyperlipidemia: Secondary | ICD-10-CM | POA: Diagnosis not present

## 2020-04-23 DIAGNOSIS — D62 Acute posthemorrhagic anemia: Secondary | ICD-10-CM

## 2020-04-23 DIAGNOSIS — K250 Acute gastric ulcer with hemorrhage: Secondary | ICD-10-CM

## 2020-04-23 DIAGNOSIS — R7303 Prediabetes: Secondary | ICD-10-CM

## 2020-04-23 MED ORDER — SUCRALFATE 1 GM/10ML PO SUSP: 1.0000 g | Freq: Three times a day (TID) | ORAL | 0 refills | Status: DC

## 2020-04-23 MED ORDER — ATORVASTATIN CALCIUM 80 MG PO TABS: 80.0000 mg | ORAL_TABLET | Freq: Every evening | ORAL | 0 refills | Status: DC

## 2020-04-23 MED ORDER — PANTOPRAZOLE SODIUM 40 MG PO TBEC: 40.0000 mg | DELAYED_RELEASE_TABLET | Freq: Two times a day (BID) | ORAL | 0 refills | Status: DC

## 2020-04-23 NOTE — Progress Notes (Signed)
Location:  [facility] Room Number: 317-A Place of Service:  SNF (31) Provider:  Kenard Gower, DNP, FNP-BC  Patient Care Team: [name], MD as PCP - General (Internal Medicine) Luciana Axe Alford [place], MD as Consulting Physician (Ophthalmology)  Extended Emergency Contact Information Primary Emergency Contact: Hunt Oris Home Phone: [phone] Mobile Phone: [phone] Relation: Brother Secondary Emergency Contact: Turley,Richard Address: [address]          [place], [place] [address] [place] of [place] Home Phone: [phone] Mobile Phone: [phone] Relation: Other  Code Status:  Full Code  Goals of care: Advanced Directive information Advanced Directives 04/06/2020  Does Patient Have a Medical Advance Directive? Yes  Type of Estate agent of [place];Living will  Does patient want to make changes to medical advance directive? No - Patient declined  Copy of Healthcare Power of Attorney in Chart? No - copy requested  Would patient like information on creating a medical advance directive? -     Chief Complaint  Patient presents with  . Discharge Note    Patient is seen for discharge from SNF    HPI:  Pt is a 74 y.o. female who is for discharge home on 04/25/20 with Home health PT and OT.  She was [facility] to [facility] on 04/10/20 post [facility] hospitalization 04/03/20 to 04/10/20.  She has a PMH of prediabetes, venous insufficiency, GERD, hyperlipidemia, hypertension, right thalamic infarction and chronic kidney disease is stage III.  She presented to the ED with worsening weakness, nausea and vomiting for 2 weeks.  She was found to be orthostatic hypotensive with hemoglobin of 8.6.  CT of the abdomen showed a gallbladder sludge/stones.  Right upper quadrant ultrasound was normal.  GI was consulted.  She had coffee-ground emesis on 04/05/2020.  EGD was done on 04/05/2020 which showed large  gastric ulcer with active bleeding, S/P epi, coagulation and hemostatic clip and spray for hemostasis.  Repeat EGD on 04/08/2020 showed normal esophagus, nonbleeding gastric ulcers with no stigmata or bleeding.  Large ulcer with adherent clot overlying the previously treated visible vessel.  The size and appearance of the ulcer are still at elevated risk for future rebleed.  Protonix 40 mg twice a day for [redacted] weeks along with sucralfate suspension 1 g orally QID x4 weeks.  She completed quadruple therapy for H. pylori for a total of 2 weeks.  She has a follow-up appointment with GI on 05/28/2020 at 3:40 PM, Dr. Marina Goodell, for a repeat endoscopy to check healing.  Her hospital stay was complicated by a drop in hemoglobin from 12-7.1.  She was transfused 1 units PRBC.  Latest hemoglobin 10.1, 04/17/2020.  Patient was admitted to this facility for short-term rehabilitation after the patient's recent hospitalization.  Patient has completed SNF rehabilitation and therapy has cleared the patient for discharge.   Past Medical History:  Diagnosis Date  . Cataract   . Complication of anesthesia    Difficult to wake  . GERD (gastroesophageal reflux disease)   . Hyperlipidemia   . Hypertension 1995  . IBS (irritable bowel syndrome)   . Morbid obesity (HCC)   . 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 Posterior vitreous detachment 10/29/2015  . Prediabetes 2016  . Right thalamic infarction (HCC) 11/03/2019   Lacunar R thalamic infarction without hemorrhage or shift per MRI 11/03/2019  . Venous insufficiency    Past Surgical History:  Procedure Laterality Date  . CATARACT EXTRACTION, BILATERAL Bilateral 2018  L- 01/2017, R-02/2017  . ESOPHAGOGASTRODUODENOSCOPY (EGD) WITH PROPOFOL N/A 04/05/2020   Procedure: ESOPHAGOGASTRODUODENOSCOPY (EGD) WITH PROPOFOL;  Surgeon: Hilarie Fredrickson, MD;  Location: [facility] ENDOSCOPY;  Service: Endoscopy;  Laterality: N/A;  . ESOPHAGOGASTRODUODENOSCOPY (EGD) WITH PROPOFOL N/A 04/08/2020    Procedure: ESOPHAGOGASTRODUODENOSCOPY (EGD) WITH PROPOFOL;  Surgeon: Shellia Cleverly, DO;  Location: [facility] ENDOSCOPY;  Service: Gastroenterology;  Laterality: N/A;  . HEMOSTASIS CLIP PLACEMENT  04/05/2020   Procedure: HEMOSTASIS CLIP PLACEMENT;  Surgeon: Hilarie Fredrickson, MD;  Location: [facility] ENDOSCOPY;  Service: Endoscopy;;  . HEMOSTASIS CONTROL  04/05/2020   Procedure: HEMOSTASIS CONTROL;  Surgeon: Hilarie Fredrickson, MD;  Location: [facility] ENDOSCOPY;  Service: Endoscopy;;  . HOT HEMOSTASIS N/A 04/05/2020   Procedure: HOT HEMOSTASIS (ARGON PLASMA COAGULATION/BICAP);  Surgeon: Hilarie Fredrickson, MD;  Location: [facility] ENDOSCOPY;  Service: Endoscopy;  Laterality: N/A;  . RETINAL TEAR REPAIR CRYOTHERAPY Bilateral 2000   Dr. Cecilie Kicks  . SUBMUCOSAL INJECTION  04/05/2020   Procedure: SUBMUCOSAL INJECTION;  Surgeon: Hilarie Fredrickson, MD;  Location: [facility] ENDOSCOPY;  Service: Endoscopy;;    Allergies  Allergen Reactions  . Ppd [Tuberculin Purified Protein Derivative]   . Sulfa Antibiotics Other (See Comments)    Passed out  . Tolectin [Tolmetin]     Outpatient Encounter Medications as of 04/23/2020  Medication Sig  . atorvastatin (LIPITOR) 80 MG tablet Take 1 tablet (80 mg total) by mouth every evening.  . Nutritional Supplements (NUTRITIONAL SUPPLEMENT PO) Take 1 each by mouth in the morning and at bedtime. Health Shake  . pantoprazole (PROTONIX) 40 MG tablet Take 1 tablet (40 mg total) by mouth 2 (two) times daily before a meal.  . sucralfate (CARAFATE) 1 GM/10ML suspension Take 10 mLs (1 g total) by mouth 4 (four) times daily -  with meals and at bedtime.   No facility-administered encounter medications on file as of 04/23/2020.    Review of Systems  GENERAL: No change in appetite, no fatigue, no weight changes, no fever, chills or weakness MOUTH and THROAT: Denies oral discomfort, gingival pain or bleeding, pain from teeth or hoarseness   RESPIRATORY: no cough, SOB, DOE, wheezing, hemoptysis CARDIAC: No chest pain,  edema or palpitations GI: No abdominal pain, diarrhea, constipation, heart burn, nausea or vomiting GU: Denies dysuria, frequency, hematuria, incontinence, or discharge NEUROLOGICAL: Denies dizziness, syncope, numbness, or headache PSYCHIATRIC: Denies feelings of depression or anxiety. No report of hallucinations, insomnia, paranoia, or agitation   Immunization History  Administered Date(s) Administered  . PFIZER SARS-COV-2 Vaccination 01/03/2020, 01/24/2020   Pertinent  Health Maintenance Due  Topic Date Due  . COLONOSCOPY  Never done  . MAMMOGRAM  06/25/2017  . DEXA SCAN  Completed  . INFLUENZA VACCINE  Discontinued  . PNA vac Low Risk Adult  Discontinued   Fall Risk  06/15/2018 11/15/2016 11/03/2015 04/07/2015  Falls in the past year? No No No Yes  Number falls in past yr: - - - 1  Injury with Fall? - - - No  Risk for fall due to : - - - History of fall(s);Other (Comment)  Risk for fall due to: Comment - - - dehydration  Follow up - - - Falls prevention discussed;Education provided     Vitals:   04/23/20 1232  BP: 124/76  Pulse: 80  Resp: 18  Temp: 98.4 F (36.9 C)  TempSrc: Oral  Weight: 217 lb (98.4 kg)  Height: 5\' 9"  (1.753 m)   Body mass index is 32.05 kg/m.  Physical Exam  GENERAL APPEARANCE:  Well nourished. In no acute distress. Obese SKIN:  Skin is warm and dry.  MOUTH and THROAT: Lips are without lesions. Oral mucosa is moist and without lesions. Tongue is normal in shape, size, and color and without lesions RESPIRATORY: Breathing is even & unlabored, BS CTAB CARDIAC: RRR, no murmur,no extra heart sounds, no edema GI: Abdomen soft, normal BS, no masses, no tenderness EXTREMITIES:  Able to move X 4 extremities NEUROLOGICAL: There is no tremor. Speech is clear. Alert and oriented X 3. PSYCHIATRIC:  Affect and behavior are appropriate  Labs reviewed: Recent Labs    04/08/20 1133 04/08/20 1133 04/09/20 0358 04/10/20 0614 04/13/20 0000  NA 139   < > 140  141 140  K 3.2*   < > 3.2* 3.8 3.3*  CL 110   < > 109 110 104  CO2 22   < > 25 23 24*  GLUCOSE 120*  --  100* 97  --   BUN 14   < > 9 6* 4  CREATININE 1.02*   < > 0.94 0.98 0.9  CALCIUM 8.2*   < > 8.2* 8.3* 8.5*  MG  --   --  1.7 1.6*  --    < > = values in this interval not displayed.   Recent Labs    11/03/19 1207 11/03/19 1207 11/19/19 1108 04/03/20 1355 04/04/20 0442  AST 17   < > 19 58* 43*  ALT 12   < > ALKPHOS 64  --   --  56 47  BILITOT 0.5   < > 0.4 1.9* 0.6  PROT 7.3   < > 7.6 8.0 6.3*  ALBUMIN 3.1*  --   --  3.0* 2.5*   < > = values in this interval not displayed.   Recent Labs    04/08/20 1133 04/08/20 1133 04/09/20 0358 04/09/20 0358 04/10/20 0614 04/13/20 0000 04/17/20 0000  WBC 7.3   < > 6.7   < > 6.5 6.1 4.9  NEUTROABS 5.1   < > 4.1   < > 4.3 7 3   HGB 9.6*   < > 8.4*   < > 9.2* 9.7* 10.1*  HCT 29.2*   < > 26.6*   < > 28.8* 30* 31*  MCV 90.7  --  90.8  --  93.5  --   --   PLT 178   < > 179   < > 173 183 208   < > = values in this interval not displayed.   Lab Results  Component Value Date   TSH 0.99 11/19/2019   Lab Results  Component Value Date   HGBA1C 5.5 04/14/2020   Lab Results  Component Value Date   CHOL 292 (H) 11/03/2019   HDL 40 (L) 11/03/2019   LDLCALC 230 (H) 11/03/2019   TRIG 111 11/03/2019   CHOLHDL 7.3 11/03/2019    Significant Diagnostic Results in last 30 days:  CT ABDOMEN PELVIS WO CONTRAST  Result Date: 04/03/2020 CLINICAL DATA:  Nausea, vomiting EXAM: CT ABDOMEN AND PELVIS WITHOUT CONTRAST TECHNIQUE: Multidetector CT imaging of the abdomen and pelvis was performed following the standard protocol without IV contrast. COMPARISON:  None FINDINGS: Lower chest: Bandlike atelectasis in the right lung base with elevation of the right hemidiaphragm. Lung bases otherwise clear. Normal heart size. No pericardial effusion. Coronary artery and aortic leaflet calcifications are present. Hepatobiliary: Subcentimeter hyper in  focus at the dome of the liver too small to fully characterize on CT  imaging but statistically likely benign (3/8). No visible concerning liver lesion. Smooth surface contour. Normal attenuation. Dependently layering hyperdense material within the gallbladder could reflect a mixture of stones and sludge. No pericholecystic fluid or inflammation. No ductal dilatation or visible intraductal gallstones. Pancreas: Unremarkable. No pancreatic ductal dilatation or surrounding inflammatory changes. Spleen: Normal in size. No concerning splenic lesions. Adrenals/Urinary Tract: Normal adrenal glands. Kidneys are normally located and symmetric in size without visible concerning renal lesion. Urolithiasis or hydronephrosis. Urinary bladder is unremarkable. Stomach/Bowel: Distal esophagus, stomach and duodenal sweep are unremarkable. No small bowel wall thickening or dilatation. No evidence of obstruction. A normal appendix is visualized. No colonic dilatation or wall thickening. Scattered colonic diverticula without focal inflammation to suggest diverticulitis. Vascular/Lymphatic: Atherosclerotic calcifications within the abdominal aorta and branch vessels. No aneurysm or ectasia. No enlarged abdominopelvic lymph nodes. Reproductive: Mildly retroverted uterus. No concerning adnexal lesions. Other: No abdominopelvic free fluid or free gas. No bowel containing hernias. Musculoskeletal: Mild rightward curvature of the thoracolumbar spine with a degree of pelvic tilt. No acute or worrisome osseous abnormalities. Multilevel flowing anterior osteophytosis in the lower thoracic spine, compatible with features of diffuse idiopathic skeletal hyperostosis (DISH). Multilevel degenerative changes are present in the imaged portions of the spine. Additional degenerative changes in the hips and pelvis. IMPRESSION: 1. Hyperdense material within the gallbladder, could reflect a mixture of biliary stones and sludge without features of acute  cholecystitis. If there is further concern, right upper quadrant ultrasound could be obtained. 2. Colonic diverticulosis without evidence of diverticulitis. 3. No other acute or suspicious abnormality to provide cause for patient's symptoms. 4. Aortic Atherosclerosis (ICD10-I70.0). Electronically Signed   By: Kreg ShropshirePrice  DeHay M.D.   On: 04/03/2020 15:06   DG Chest Port 1 View  Result Date: 04/04/2020 CLINICAL DATA:  Leukocytosis, weakness, nausea, vomiting EXAM: PORTABLE CHEST 1 VIEW COMPARISON:  Portable exam 1602 hours compared to 03/14/2012 FINDINGS: Normal heart size, mediastinal contours, and pulmonary vascularity. Rotated to the RIGHT. Mild RIGHT basilar atelectasis. Lungs otherwise clear. No infiltrate, pleural effusion or pneumothorax. IMPRESSION: Mild RIGHT basilar atelectasis. Electronically Signed   By: Ulyses SouthwardMark  Boles M.D.   On: 04/04/2020 16:15   US ABDOMEN LIMITED RUQ  Result Date: 04/03/2020 CLINICAL DATA:  Epigastric abdominal pain, nausea, vomiting EXAM: ULTRASOUND ABDOMEN LIMITED RIGHT UPPER QUADRANT COMPARISON:  CT 2:43 p.m. FINDINGS: Gallbladder: No gallstones or wall thickening visualized. No sonographic Murphy sign noted by sonographer. Common bile duct: Diameter: 4 mm in proximal diameter. The distal duct is obscured by overlying bowel gas. Liver: No focal lesion identified. Within normal limits in parenchymal echogenicity. Portal vein is patent on color Doppler imaging with normal direction of blood flow towards the liver. Other: No ascites IMPRESSION: Normal right upper quadrant sonogram. Electronically Signed   By: Helyn NumbersAshesh  Parikh MD   On: 04/03/2020 18:51    Assessment/Plan  1. Acute gastric ulcer with hemorrhage -  Follow up with Dr. Marina GoodellPerry, GI, on 05/28/20 @ 3:40 PM - pantoprazole (PROTONIX) 40 MG tablet; Take 1 tablet (40 mg total) by mouth 2 (two) times daily before a meal.  Dispense: 60 tablet; Refill: 0 - sucralfate (CARAFATE) 1 GM/10ML suspension; Take 10 mLs (1 g total) by  mouth 4 (four) times daily -  with meals and at bedtime.  Dispense: 1260 mL; Refill: 0  2. Prediabetes Lab Results  Component Value Date   HGBA1C 5.5 04/14/2020   - diet-controlled  3. Acute blood loss anemia  Lab Results  Component Value Date  HGB 10.1 (A) 04/17/2020   - S/P blood transfusion of 4 units PRBC -  improved  4. Orthostatic hypotension -  Latest BP 124/76 -  improved  5. Obesity (BMI 30.0-34.9) Wt Readings from Last 3 Encounters:  04/23/20 217 lb (98.4 kg)  04/14/20 227 lb 15.4 oz (103.4 kg)  04/13/20 227 lb 15.4 oz (103.4 kg)   Body mass index is 32.05 kg/m.  -  Discussed food choices  6. Mixed hyperlipidemia Lab Results  Component Value Date   CHOL 292 (H) 11/03/2019   HDL 40 (L) 11/03/2019   LDLCALC 230 (H) 11/03/2019   TRIG 111 11/03/2019   CHOLHDL 7.3 11/03/2019   - atorvastatin (LIPITOR) 80 MG tablet; Take 1 tablet (80 mg total) by mouth every evening.  Dispense: 30 tablet; Refill: 0  7. Hypomagnesemia - Mg 2.1, 04/14/20 -  resolved  8. Physical deconditioning - for Home health PT and OT, for therapeutic strengthening exercises     I have filled out patient's discharge paperwork and e-prescribed medications.  Patient will receive home health PT and OT.  DME provided:  None  Total discharge time: Greater than 30 minutes Greater than 50% was spent in counseling and coordination of care   Discharge time involved coordination of the discharge process with social worker, nursing staff and therapy department. Medical justification for home health services/DME verified.    Kenard Gower, DNP, MSN, FNP-BC [facility] [phone] (Monday-Friday 8:00 a.m. - 5:00 p.m.) [phone] (after hours)

## 2020-04-23 NOTE — Telephone Encounter (Signed)
[facility] Discharged patient today,  nurse called and scheduled 10 follow up 05/04/20. Does this patient need a telephone encounter w/ you?

## 2020-04-26 ENCOUNTER — Other Ambulatory Visit: Payer: Self-pay | Admitting: Internal Medicine

## 2020-04-26 DIAGNOSIS — I10 Essential (primary) hypertension: Secondary | ICD-10-CM

## 2020-04-27 ENCOUNTER — Telehealth: Payer: Self-pay | Admitting: Adult Health

## 2020-04-27 DIAGNOSIS — R7303 Prediabetes: Secondary | ICD-10-CM | POA: Diagnosis not present

## 2020-04-27 DIAGNOSIS — N183 Chronic kidney disease, stage 3 unspecified: Secondary | ICD-10-CM | POA: Diagnosis not present

## 2020-04-27 DIAGNOSIS — K589 Irritable bowel syndrome without diarrhea: Secondary | ICD-10-CM | POA: Diagnosis not present

## 2020-04-27 DIAGNOSIS — H43819 Vitreous degeneration, unspecified eye: Secondary | ICD-10-CM | POA: Diagnosis not present

## 2020-04-27 DIAGNOSIS — I129 Hypertensive chronic kidney disease with stage 1 through stage 4 chronic kidney disease, or unspecified chronic kidney disease: Secondary | ICD-10-CM | POA: Diagnosis not present

## 2020-04-27 DIAGNOSIS — I872 Venous insufficiency (chronic) (peripheral): Secondary | ICD-10-CM | POA: Diagnosis not present

## 2020-04-27 DIAGNOSIS — K253 Acute gastric ulcer without hemorrhage or perforation: Secondary | ICD-10-CM | POA: Diagnosis not present

## 2020-04-27 DIAGNOSIS — K219 Gastro-esophageal reflux disease without esophagitis: Secondary | ICD-10-CM | POA: Diagnosis not present

## 2020-04-27 DIAGNOSIS — H33339 Multiple defects of retina without detachment, unspecified eye: Secondary | ICD-10-CM | POA: Diagnosis not present

## 2020-04-27 NOTE — Telephone Encounter (Signed)
[employer] case manager states patient does not know what medicine she is supposed to be taking. Please call patient to review medications.

## 2020-04-29 ENCOUNTER — Other Ambulatory Visit: Payer: Self-pay

## 2020-04-29 NOTE — Patient Outreach (Signed)
[facility]) Care Management  04/29/2020  Susan Fernandez 1946-02-12 [id number]    EMMI-General Dischagre RED ON EMMI ALERT Day # 1 Date: 04/28/2020 Red Alert Reason: "New prescriptions? I don't know"   Outreach attempt #1 to patient. No answer after multiple rings and unable to leave message.        Plan: RN CM will make outreach attempt to patient within 3-4 business days. RN CM will send unsuccessful outreach letter to patient.  Antionette Fairy, RN,BSN,CCM [facility] Care Management Telephonic Care Management Coordinator Direct Phone: [phone] Toll Free: [phone] Fax: [phone]

## 2020-04-30 ENCOUNTER — Other Ambulatory Visit: Payer: Self-pay

## 2020-04-30 DIAGNOSIS — K219 Gastro-esophageal reflux disease without esophagitis: Secondary | ICD-10-CM | POA: Diagnosis not present

## 2020-04-30 DIAGNOSIS — R7303 Prediabetes: Secondary | ICD-10-CM | POA: Diagnosis not present

## 2020-04-30 DIAGNOSIS — K253 Acute gastric ulcer without hemorrhage or perforation: Secondary | ICD-10-CM | POA: Diagnosis not present

## 2020-04-30 DIAGNOSIS — H43819 Vitreous degeneration, unspecified eye: Secondary | ICD-10-CM | POA: Diagnosis not present

## 2020-04-30 DIAGNOSIS — H33339 Multiple defects of retina without detachment, unspecified eye: Secondary | ICD-10-CM | POA: Diagnosis not present

## 2020-04-30 DIAGNOSIS — N183 Chronic kidney disease, stage 3 unspecified: Secondary | ICD-10-CM | POA: Diagnosis not present

## 2020-04-30 DIAGNOSIS — I129 Hypertensive chronic kidney disease with stage 1 through stage 4 chronic kidney disease, or unspecified chronic kidney disease: Secondary | ICD-10-CM | POA: Diagnosis not present

## 2020-04-30 DIAGNOSIS — I872 Venous insufficiency (chronic) (peripheral): Secondary | ICD-10-CM | POA: Diagnosis not present

## 2020-04-30 DIAGNOSIS — K589 Irritable bowel syndrome without diarrhea: Secondary | ICD-10-CM | POA: Diagnosis not present

## 2020-04-30 NOTE — Patient Outreach (Signed)
[facility]) Care Management  04/30/2020  Tyrell Brereton 1945/09/27 [id number]   EMMI-General Discharge RED ON EMMI ALERT Day # 1 Date: 04/28/2020 Red Alert Reason: "New prescriptions? I don't know"   Outreach attempt #2 to patient. Spoke with patient who reports she is doing fairly well. Reviewed and addressed red alert with patient. She reports she does have paperwork and as able to get paperwork and read it to RN CM during this call. Patient named meds listed on instructions for her to take at home. She is inquiring about why her BP med not listed. Per EMR patient was having issues with orthostatic hypotension and discussed this with patient as possible cause. Advised patient if she was concerned about not being on BP then to follow up with PCP office. She voiced understanding and would do so. She has MD appt on 06/02/20. She denies any other needs or concerns at this time. Patient declined further follow up stating she had "too many people calling her right now" and it was too much for her. She is ware that she cn call back for any needs or concerns.    Plan: RN CM will close case at this time.                  Antionette Fairy, RN,BSN,CCM [facility] Care Management Telephonic Care Management Coordinator Direct Phone: [phone] Toll Free: [phone] Fax: [phone]

## 2020-05-03 NOTE — Progress Notes (Signed)
Hospital follow up  Assessment and Plan: Hospital visit follow up for follow up after discharge from [facility] rehab.   Susan Fernandez was seen today for follow-up.  Diagnoses and all orders for this visit:  Acute upper GI bleeding Acute gastric ulcer with hemorrhage Acute blood loss anemia -CBC canceled Has completed 8wk course of pantoprazole Currently taking Carafate liquid, past 4 week initial treatment, STOP this. Asymptomatic Patient refused to get labs today Discussed importance of this at length No bASA related to GI bleed  AKI (acute kidney injury) (HCC) -CMP, canceled Refused labs Discussed with patient at length  Orthostatic hypotension -No blood pressure medications at this time Continue to monitor blood pressure.  Other elevated white blood cell (WBC) count Refused labs today  Hypomagnesemia Improved, last Mg (2.1) 08/83/21 Refused labs today  Obesity (BMI 30.0-34.9) Discussed dietary and exercise modifications  Morbid obesity (HCC) Discussed dietary and exercise modifications  Aortic atherosclerosis (HCC) Control blood pressure, lipids and glucose Disscused lifestyle modifications, diet & exercise Continue to monitor  At high risk for falls HH PT Order for DME toilet seat for bathroom for safety Contact office with any new or worsening symptoms  Medication management Continued  All medications were reviewed with patient and family and fully reconciled. All questions answered fully, and patient and family members were encouraged to call the office with any further questions or concerns. Discussed goal to avoid readmission related to this diagnosis.    Over 40 minutes of face to face interview, exam, counseling, chart review, and complex, high/moderate level critical decision making was performed this visit.   Future Appointments  Date Time Provider Department Center  05/28/2020  3:40 PM Hilarie Fredrickson, MD [facility]  06/02/2020 11:00 AM  [name], MD [facility] None     HPI 74 y.o.female presents for follow up for transition from recent hospitalization or SNIF stay. Admit date to the hospital was 04/03/20, patient was discharged from the hospital on 04/10/20 She then went to [facility] for rehab and was discharged on 08/14/21and our clinical staff contacted the office the day after discharge to set up a follow up appointment. The discharge summary, medications, and diagnostic test results were reviewed before meeting with the patient. The patient was admitted for: Orthostatic hypotension, intractable nausea vomiting.  EGD on 04/05/20 showed large gastric ulcer. Hemoglobin 7.1, she was transfused 4units of PRBC and last hemoglobin 10.1 on 04/17/20.  She is to take protonix 40mg  BID for [redacted] weeks along with surcralfate suspension 1g po QID for 4 weeks.  Repeat upper endoscopy in 8 weeks. Her amlodipine 5mg  was stopped related to the hypotension along with bASA related to gastric ulcer. She follow up with GI on 05/28/20, 3:40pm.  She reports overall she is doing well since being home from [place].  She reports that she continues to tire easily.  Reports she is eating one small meal a day and also drinking ensure daily.  She knows she is not drinking enough water. She has history of HTN but was hypotensive related to volume with blood loss.  She has not been taking blood pressure medication and it was not restarted while at [facility] for Rehab.  She is not checking her blood pressure at home at this time.  Reports that when PT comes they check this.   Home health is involved.  [facility], PT and they are schedule to come tomorrow.  She is refusing to have blood work drawn today.  Discussed this at length with patient as  she was a blood transfusion in hospital related to bleeding ulcer.      Images while in the hospital: CT ABDOMEN PELVIS WO CONTRAST  Result Date: 04/03/2020 CLINICAL DATA:  Nausea, vomiting EXAM: CT ABDOMEN AND  PELVIS WITHOUT CONTRAST TECHNIQUE: Multidetector CT imaging of the abdomen and pelvis was performed following the standard protocol without IV contrast. COMPARISON:  None FINDINGS: Lower chest: Bandlike atelectasis in the right lung base with elevation of the right hemidiaphragm. Lung bases otherwise clear. Normal heart size. No pericardial effusion. Coronary artery and aortic leaflet calcifications are present. Hepatobiliary: Subcentimeter hyper in focus at the dome of the liver too small to fully characterize on CT imaging but statistically likely benign (3/8). No visible concerning liver lesion. Smooth surface contour. Normal attenuation. Dependently layering hyperdense material within the gallbladder could reflect a mixture of stones and sludge. No pericholecystic fluid or inflammation. No ductal dilatation or visible intraductal gallstones. Pancreas: Unremarkable. No pancreatic ductal dilatation or surrounding inflammatory changes. Spleen: Normal in size. No concerning splenic lesions. Adrenals/Urinary Tract: Normal adrenal glands. Kidneys are normally located and symmetric in size without visible concerning renal lesion. Urolithiasis or hydronephrosis. Urinary bladder is unremarkable. Stomach/Bowel: Distal esophagus, stomach and duodenal sweep are unremarkable. No small bowel wall thickening or dilatation. No evidence of obstruction. A normal appendix is visualized. No colonic dilatation or wall thickening. Scattered colonic diverticula without focal inflammation to suggest diverticulitis. Vascular/Lymphatic: Atherosclerotic calcifications within the abdominal aorta and branch vessels. No aneurysm or ectasia. No enlarged abdominopelvic lymph nodes. Reproductive: Mildly retroverted uterus. No concerning adnexal lesions. Other: No abdominopelvic free fluid or free gas. No bowel containing hernias. Musculoskeletal: Mild rightward curvature of the thoracolumbar spine with a degree of pelvic tilt. No acute or  worrisome osseous abnormalities. Multilevel flowing anterior osteophytosis in the lower thoracic spine, compatible with features of diffuse idiopathic skeletal hyperostosis (DISH). Multilevel degenerative changes are present in the imaged portions of the spine. Additional degenerative changes in the hips and pelvis. IMPRESSION: 1. Hyperdense material within the gallbladder, could reflect a mixture of biliary stones and sludge without features of acute cholecystitis. If there is further concern, right upper quadrant ultrasound could be obtained. 2. Colonic diverticulosis without evidence of diverticulitis. 3. No other acute or suspicious abnormality to provide cause for patient's symptoms. 4. Aortic Atherosclerosis (ICD10-I70.0). Electronically Signed   By: Kreg Shropshire M.D.   On: 04/03/2020 15:06   DG Chest Port 1 View  Result Date: 04/04/2020 CLINICAL DATA:  Leukocytosis, weakness, nausea, vomiting EXAM: PORTABLE CHEST 1 VIEW COMPARISON:  Portable exam 1602 hours compared to 03/14/2012 FINDINGS: Normal heart size, mediastinal contours, and pulmonary vascularity. Rotated to the RIGHT. Mild RIGHT basilar atelectasis. Lungs otherwise clear. No infiltrate, pleural effusion or pneumothorax. IMPRESSION: Mild RIGHT basilar atelectasis. Electronically Signed   By: Ulyses Southward M.D.   On: 04/04/2020 16:15   US ABDOMEN LIMITED RUQ  Result Date: 04/03/2020 CLINICAL DATA:  Epigastric abdominal pain, nausea, vomiting EXAM: ULTRASOUND ABDOMEN LIMITED RIGHT UPPER QUADRANT COMPARISON:  CT 2:43 p.m. FINDINGS: Gallbladder: No gallstones or wall thickening visualized. No sonographic Murphy sign noted by sonographer. Common bile duct: Diameter: 4 mm in proximal diameter. The distal duct is obscured by overlying bowel gas. Liver: No focal lesion identified. Within normal limits in parenchymal echogenicity. Portal vein is patent on color Doppler imaging with normal direction of blood flow towards the liver. Other: No ascites  IMPRESSION: Normal right upper quadrant sonogram. Electronically Signed   By: Helyn Numbers MD  On: 04/03/2020 18:51      Current Outpatient Medications (Cardiovascular):  .  atorvastatin (LIPITOR) 80 MG tablet, Take 1 tablet Daily for Cholesterol     Current Outpatient Medications (Other):  Marland Kitchen  Nutritional Supplements (NUTRITIONAL SUPPLEMENT PO), Take 1 each by mouth in the morning and at bedtime. Health Shake .  sucralfate (CARAFATE) 1 GM/10ML suspension, Take 10 mLs (1 g total) by mouth 4 (four) times daily -  with meals and at bedtime. .  pantoprazole (PROTONIX) 40 MG tablet, Take 1 tablet (40 mg total) by mouth 2 (two) times daily before a meal.  Past Medical History:  Diagnosis Date  . Cataract   . Complication of anesthesia    Difficult to wake  . GERD (gastroesophageal reflux disease)   . Hyperlipidemia   . Hypertension 1995  . IBS (irritable bowel syndrome)   . Morbid obesity (HCC)   . 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 Posterior vitreous detachment 10/29/2015  . Prediabetes 2016  . Right thalamic infarction (HCC) 11/03/2019   Lacunar R thalamic infarction without hemorrhage or shift per MRI 11/03/2019  . Venous insufficiency      Allergies  Allergen Reactions  . Ppd [Tuberculin Purified Protein Derivative]   . Sulfa Antibiotics Other (See Comments)    Passed out  . Tolectin [Tolmetin]     ROS: all negative except above.   Physical Exam: Filed Weights   05/04/20 1128  Weight: 201 lb (91.2 kg)   BP 136/78   Pulse 88   Temp (!) 96.4 F (35.8 C)   Wt 201 lb (91.2 kg)   SpO2 99%   BMI 29.68 kg/m  General Appearance: Well nourished, in no apparent distress. Eyes: PERRLA, EOMs, conjunctiva no swelling or erythema Sinuses: No Frontal/maxillary tenderness ENT/Mouth: Ext aud canals clear, TMs without erythema, bulging. No erythema, swelling, or exudate on post pharynx.  Tonsils not swollen or erythematous. Hearing normal.  Neck: Supple, thyroid normal.   Respiratory: Respiratory effort normal, BS equal bilaterally without rales, rhonchi, wheezing or stridor.  Cardio: RRR with no MRGs. Brisk peripheral pulses without edema.  Abdomen: Soft, + BS.  Non tender, no guarding, rebound, hernias, masses. Lymphatics: Non tender without lymphadenopathy.  Musculoskeletal: Full ROM, 5/5 strength, normal gait.  Skin: Warm, dry without rashes, lesions, ecchymosis.  Neuro: Cranial nerves intact. Normal muscle tone, no cerebellar symptoms. Sensation intact.  Psych: Awake and oriented X 3, normal affect, Insight and Judgment appropriate.      Elder Negus, Edrick Oh, DNP [facility] 05/04/2020  1:33 PM

## 2020-05-04 ENCOUNTER — Encounter: Payer: Self-pay | Admitting: Adult Health Nurse Practitioner

## 2020-05-04 ENCOUNTER — Other Ambulatory Visit: Payer: Self-pay

## 2020-05-04 ENCOUNTER — Ambulatory Visit: Payer: Medicare PPO | Admitting: Adult Health Nurse Practitioner

## 2020-05-04 VITALS — BP 136/78 | HR 88 | Temp 96.4°F | Wt 201.0 lb

## 2020-05-04 DIAGNOSIS — E66811 Obesity, class 1: Secondary | ICD-10-CM

## 2020-05-04 DIAGNOSIS — K922 Gastrointestinal hemorrhage, unspecified: Secondary | ICD-10-CM

## 2020-05-04 DIAGNOSIS — K250 Acute gastric ulcer with hemorrhage: Secondary | ICD-10-CM | POA: Diagnosis not present

## 2020-05-04 DIAGNOSIS — I951 Orthostatic hypotension: Secondary | ICD-10-CM | POA: Diagnosis not present

## 2020-05-04 DIAGNOSIS — D72828 Other elevated white blood cell count: Secondary | ICD-10-CM | POA: Diagnosis not present

## 2020-05-04 DIAGNOSIS — D62 Acute posthemorrhagic anemia: Secondary | ICD-10-CM | POA: Diagnosis not present

## 2020-05-04 DIAGNOSIS — Z9181 History of falling: Secondary | ICD-10-CM

## 2020-05-04 DIAGNOSIS — E669 Obesity, unspecified: Secondary | ICD-10-CM

## 2020-05-04 DIAGNOSIS — N179 Acute kidney failure, unspecified: Secondary | ICD-10-CM

## 2020-05-04 DIAGNOSIS — Z79899 Other long term (current) drug therapy: Secondary | ICD-10-CM

## 2020-05-04 DIAGNOSIS — I700 Atherosclerosis of aorta: Secondary | ICD-10-CM

## 2020-05-04 NOTE — Addendum Note (Signed)
Addended byElder Negus A on: 05/04/2020 02:27 PM   Modules accepted: Orders

## 2020-05-04 NOTE — Patient Instructions (Signed)
° °  Continue to check your blood pressure.  Goal is 140/90 OR LESS.  If your readings are above this consistently we should discuss restarting your blood pressure medication.   You have completed the course of protonix and carafate.  Your follow up with GI is on 05/28/20 at 3:40pm.  You are stop taking the Carafate, liquid medication.   We will place and order for a raised toilet seat for you.

## 2020-05-07 DIAGNOSIS — K589 Irritable bowel syndrome without diarrhea: Secondary | ICD-10-CM | POA: Diagnosis not present

## 2020-05-07 DIAGNOSIS — H43819 Vitreous degeneration, unspecified eye: Secondary | ICD-10-CM | POA: Diagnosis not present

## 2020-05-07 DIAGNOSIS — K253 Acute gastric ulcer without hemorrhage or perforation: Secondary | ICD-10-CM | POA: Diagnosis not present

## 2020-05-07 DIAGNOSIS — K219 Gastro-esophageal reflux disease without esophagitis: Secondary | ICD-10-CM | POA: Diagnosis not present

## 2020-05-07 DIAGNOSIS — R7303 Prediabetes: Secondary | ICD-10-CM | POA: Diagnosis not present

## 2020-05-07 DIAGNOSIS — H33339 Multiple defects of retina without detachment, unspecified eye: Secondary | ICD-10-CM | POA: Diagnosis not present

## 2020-05-07 DIAGNOSIS — N183 Chronic kidney disease, stage 3 unspecified: Secondary | ICD-10-CM | POA: Diagnosis not present

## 2020-05-07 DIAGNOSIS — I129 Hypertensive chronic kidney disease with stage 1 through stage 4 chronic kidney disease, or unspecified chronic kidney disease: Secondary | ICD-10-CM | POA: Diagnosis not present

## 2020-05-07 DIAGNOSIS — I872 Venous insufficiency (chronic) (peripheral): Secondary | ICD-10-CM | POA: Diagnosis not present

## 2020-05-16 DIAGNOSIS — H33339 Multiple defects of retina without detachment, unspecified eye: Secondary | ICD-10-CM | POA: Diagnosis not present

## 2020-05-16 DIAGNOSIS — N183 Chronic kidney disease, stage 3 unspecified: Secondary | ICD-10-CM | POA: Diagnosis not present

## 2020-05-16 DIAGNOSIS — K253 Acute gastric ulcer without hemorrhage or perforation: Secondary | ICD-10-CM | POA: Diagnosis not present

## 2020-05-16 DIAGNOSIS — I129 Hypertensive chronic kidney disease with stage 1 through stage 4 chronic kidney disease, or unspecified chronic kidney disease: Secondary | ICD-10-CM | POA: Diagnosis not present

## 2020-05-16 DIAGNOSIS — K219 Gastro-esophageal reflux disease without esophagitis: Secondary | ICD-10-CM | POA: Diagnosis not present

## 2020-05-16 DIAGNOSIS — H43819 Vitreous degeneration, unspecified eye: Secondary | ICD-10-CM | POA: Diagnosis not present

## 2020-05-16 DIAGNOSIS — R7303 Prediabetes: Secondary | ICD-10-CM | POA: Diagnosis not present

## 2020-05-16 DIAGNOSIS — I872 Venous insufficiency (chronic) (peripheral): Secondary | ICD-10-CM | POA: Diagnosis not present

## 2020-05-16 DIAGNOSIS — K589 Irritable bowel syndrome without diarrhea: Secondary | ICD-10-CM | POA: Diagnosis not present

## 2020-05-27 ENCOUNTER — Ambulatory Visit: Payer: Medicare PPO | Admitting: Gastroenterology

## 2020-05-27 DIAGNOSIS — N183 Chronic kidney disease, stage 3 unspecified: Secondary | ICD-10-CM | POA: Diagnosis not present

## 2020-05-27 DIAGNOSIS — H43819 Vitreous degeneration, unspecified eye: Secondary | ICD-10-CM | POA: Diagnosis not present

## 2020-05-27 DIAGNOSIS — I129 Hypertensive chronic kidney disease with stage 1 through stage 4 chronic kidney disease, or unspecified chronic kidney disease: Secondary | ICD-10-CM | POA: Diagnosis not present

## 2020-05-27 DIAGNOSIS — H33339 Multiple defects of retina without detachment, unspecified eye: Secondary | ICD-10-CM | POA: Diagnosis not present

## 2020-05-27 DIAGNOSIS — R7303 Prediabetes: Secondary | ICD-10-CM | POA: Diagnosis not present

## 2020-05-27 DIAGNOSIS — K589 Irritable bowel syndrome without diarrhea: Secondary | ICD-10-CM | POA: Diagnosis not present

## 2020-05-27 DIAGNOSIS — I872 Venous insufficiency (chronic) (peripheral): Secondary | ICD-10-CM | POA: Diagnosis not present

## 2020-05-27 DIAGNOSIS — K219 Gastro-esophageal reflux disease without esophagitis: Secondary | ICD-10-CM | POA: Diagnosis not present

## 2020-05-27 DIAGNOSIS — K253 Acute gastric ulcer without hemorrhage or perforation: Secondary | ICD-10-CM | POA: Diagnosis not present

## 2020-05-28 ENCOUNTER — Other Ambulatory Visit (INDEPENDENT_AMBULATORY_CARE_PROVIDER_SITE_OTHER): Payer: Medicare PPO

## 2020-05-28 ENCOUNTER — Encounter: Payer: Self-pay | Admitting: Internal Medicine

## 2020-05-28 ENCOUNTER — Ambulatory Visit (INDEPENDENT_AMBULATORY_CARE_PROVIDER_SITE_OTHER): Payer: Medicare PPO | Admitting: Internal Medicine

## 2020-05-28 VITALS — BP 128/80 | HR 92 | Ht 69.0 in | Wt 195.0 lb

## 2020-05-28 DIAGNOSIS — A048 Other specified bacterial intestinal infections: Secondary | ICD-10-CM

## 2020-05-28 DIAGNOSIS — K254 Chronic or unspecified gastric ulcer with hemorrhage: Secondary | ICD-10-CM

## 2020-05-28 DIAGNOSIS — D62 Acute posthemorrhagic anemia: Secondary | ICD-10-CM

## 2020-05-28 DIAGNOSIS — Z1211 Encounter for screening for malignant neoplasm of colon: Secondary | ICD-10-CM | POA: Diagnosis not present

## 2020-05-28 LAB — CBC WITH DIFFERENTIAL/PLATELET
Basophils Absolute: 0.1 10*3/uL (ref 0.0–0.1)
Basophils Relative: 1.4 % (ref 0.0–3.0)
Eosinophils Absolute: 0.1 10*3/uL (ref 0.0–0.7)
Eosinophils Relative: 0.9 % (ref 0.0–5.0)
HCT: 38.3 % (ref 36.0–46.0)
Hemoglobin: 12.7 g/dL (ref 12.0–15.0)
Lymphocytes Relative: 31.9 % (ref 12.0–46.0)
Lymphs Abs: 2.7 10*3/uL (ref 0.7–4.0)
MCHC: 33.1 g/dL (ref 30.0–36.0)
MCV: 94.5 fl (ref 78.0–100.0)
Monocytes Absolute: 0.4 10*3/uL (ref 0.1–1.0)
Monocytes Relative: 4.4 % (ref 3.0–12.0)
Neutro Abs: 5.1 10*3/uL (ref 1.4–7.7)
Neutrophils Relative %: 61.4 % (ref 43.0–77.0)
Platelets: 279 10*3/uL (ref 150.0–400.0)
RBC: 4.06 Mil/uL (ref 3.87–5.11)
RDW: 19.2 % — ABNORMAL HIGH (ref 11.5–15.5)
WBC: 8.3 10*3/uL (ref 4.0–10.5)

## 2020-05-28 MED ORDER — PANTOPRAZOLE SODIUM 40 MG PO TBEC: 40.0000 mg | DELAYED_RELEASE_TABLET | Freq: Two times a day (BID) | ORAL | 3 refills | Status: AC

## 2020-05-28 NOTE — Progress Notes (Signed)
HISTORY OF PRESENT ILLNESS:  Susan Fernandez is a 74 y.o. female with past medical history as listed below who was admitted to the hospital late July 2021 nausea vomiting and acute GI bleeding.  She underwent upper endoscopy with myself April 05, 2020.  He was found to have a giant gastric ulcer on the incisors were treated with multiple hemostatic methods.  This was successful.  She did have relook endoscopy 3 days later to evaluate the sclera gastric fundus due to retained blood.  This was unremarkable.  Ulcer was observed to be nonbleeding.  She had not been using NSAIDs.  However, she was found to have Helicobacter pylori treated with 2-week course of quadruple therapy in the form of bismuth subsalicylate, doxycycline, metronidazole, and pantoprazole.  After a 2-week course of these medications the patient states that her pantoprazole was stopped.  She was continued on Carafate.  She has not had recurrent GI bleeding.  Her hemoglobin was as low as 7.1.  She was transfused.  Hemoglobin on discharge was 9.2.  Most recent hemoglobin April 17, 2020 was 10.1.  Patient denies abdominal pain.current medications are Lipitor and Carafate.  Has had no weight loss.  She did have CT scan at time of admission.  This was noncontrast.  The stomach was NOT noted to be abnormal.  She has completed her Covid vaccination series.  REVIEW OF SYSTEMS:  All non-GI ROS negative unless otherwise stated in the HPI except for sinus allergies  Past Medical History:  Diagnosis Date  . Cataract   . Complication of anesthesia    Difficult to wake  . GERD (gastroesophageal reflux disease)   . Hyperlipidemia   . Hypertension 1995  . IBS (irritable bowel syndrome)   . Morbid obesity (HCC)   . 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 Posterior vitreous detachment 10/29/2015  . Prediabetes 2016  . Right thalamic infarction (HCC) 11/03/2019   Lacunar R thalamic infarction without hemorrhage or shift per MRI 11/03/2019  .  Venous insufficiency     Past Surgical History:  Procedure Laterality Date  . CATARACT EXTRACTION, BILATERAL Bilateral 2018   L- 01/2017, R-02/2017  . ESOPHAGOGASTRODUODENOSCOPY (EGD) WITH PROPOFOL N/A 04/05/2020   Procedure: ESOPHAGOGASTRODUODENOSCOPY (EGD) WITH PROPOFOL;  Surgeon: Hilarie Fredrickson, MD;  Location: [facility] ENDOSCOPY;  Service: Endoscopy;  Laterality: N/A;  . ESOPHAGOGASTRODUODENOSCOPY (EGD) WITH PROPOFOL N/A 04/08/2020   Procedure: ESOPHAGOGASTRODUODENOSCOPY (EGD) WITH PROPOFOL;  Surgeon: Shellia Cleverly, DO;  Location: [facility] ENDOSCOPY;  Service: Gastroenterology;  Laterality: N/A;  . HEMOSTASIS CLIP PLACEMENT  04/05/2020   Procedure: HEMOSTASIS CLIP PLACEMENT;  Surgeon: Hilarie Fredrickson, MD;  Location[facility] ENDOSCOPY;  Service: Endoscopy;;  . HEMOSTASIS CONTROL  04/05/2020   Procedure: HEMOSTASIS CONTROL;  Surgeon: Hilarie Fredrickson, MD;  Location: [facility] ENDOSCOPY;  Service: Endoscopy;;  . HOT HEMOSTASIS N/A 04/05/2020   Procedure: HOT HEMOSTASIS (ARGON PLASMA COAGULATION/BICAP);  Surgeon: Hilarie Fredrickson, MD;  Location: [facility] ENDOSCOPY;  Service: Endoscopy;  Laterality: N/A;  . RETINAL TEAR REPAIR CRYOTHERAPY Bilateral 2000   Dr. Cecilie Kicks  . SUBMUCOSAL INJECTION  04/05/2020   Procedure: SUBMUCOSAL INJECTION;  Surgeon: Hilarie Fredrickson, MD;  Location: [facility] ENDOSCOPY;  Service: Endoscopy;;    Social History Sherrian Divers  reports that she has never smoked. She has never used smokeless tobacco. She reports that she does not drink alcohol. No history on file for drug use.  family history is not on file.  Allergies  Allergen Reactions  . Ppd [Tuberculin Purified Protein Derivative]   .  Sulfa Antibiotics Other (See Comments)    Passed out  . Tolectin [Tolmetin]        PHYSICAL EXAMINATION: Vital signs: BP 128/80   Pulse 92   Ht 5\' 9"  (1.753 m)   Wt 195 lb (88.5 kg)   BMI 28.80 kg/m   Constitutional: generally well-appearing, no acute distress Psychiatric: alert and oriented x3,  cooperative Eyes: extraocular movements intact, anicteric, conjunctiva pink Mouth: oral pharynx moist, no lesions Neck: supple no lymphadenopathy Cardiovascular: heart regular rate and rhythm, no murmur Lungs: clear to auscultation bilaterally Abdomen: soft, nontender, nondistended, no obvious ascites, no peritoneal signs, normal bowel sounds, no organomegaly Rectal: Omitted Extremities: no clubbing, cyanosis, or lower extremity edema bilaterally Skin: no lesions on visible extremities Neuro: No focal deficits.  Cranial nerves intact  ASSESSMENT:  1.  Acute upper GI bleeding secondary to GI gastric ulcer status post successful endoscopic hemostatic therapy April 05, 2020. 2.  Helicobacter pylori associated ulcer.  Status post treatment with quadruple therapy 3.  Acute blood loss anemia.  Hemoglobin improving.  Needs recheck 4.  Question prior colonoscopy.  May need.  Will review at t follow-up   PLAN:  1.  Prescribe pantoprazole 40 mg twice daily. 2.  Carafate as needed 3.  Follow-up CBC today 4.  Follow-up upper endoscopy in 2 to 4 weeks to assure ulcer healing and rule out malignancy.The nature of the procedure, as well as the risks, benefits, and alternatives were carefully and thoroughly reviewed with the patient. Ample time for discussion and questions allowed. The patient understood, was satisfied, and agreed to proceed. 5.  Consider colonoscopy, if not done previously. ADDENDUM. Hemoglobin has returned to 12.7.  Patient notified today

## 2020-05-28 NOTE — Patient Instructions (Signed)
Your provider has requested that you go to the basement level for lab work before leaving today. Press "B" on the elevator. The lab is located at the first door on the left as you exit the elevator.  We have sent the following medications to your pharmacy for you to pick up at your convenience:  Pantoprazole  You have been scheduled for an endoscopy. Please follow written instructions given to you at your visit today. If you use inhalers (even only as needed), please bring them with you on the day of your procedure.

## 2020-06-02 ENCOUNTER — Encounter: Payer: Medicare PPO | Admitting: Internal Medicine

## 2020-06-02 DIAGNOSIS — H43819 Vitreous degeneration, unspecified eye: Secondary | ICD-10-CM | POA: Diagnosis not present

## 2020-06-02 DIAGNOSIS — K253 Acute gastric ulcer without hemorrhage or perforation: Secondary | ICD-10-CM | POA: Diagnosis not present

## 2020-06-02 DIAGNOSIS — K589 Irritable bowel syndrome without diarrhea: Secondary | ICD-10-CM | POA: Diagnosis not present

## 2020-06-02 DIAGNOSIS — R7303 Prediabetes: Secondary | ICD-10-CM | POA: Diagnosis not present

## 2020-06-02 DIAGNOSIS — I129 Hypertensive chronic kidney disease with stage 1 through stage 4 chronic kidney disease, or unspecified chronic kidney disease: Secondary | ICD-10-CM | POA: Diagnosis not present

## 2020-06-02 DIAGNOSIS — N183 Chronic kidney disease, stage 3 unspecified: Secondary | ICD-10-CM | POA: Diagnosis not present

## 2020-06-02 DIAGNOSIS — H33339 Multiple defects of retina without detachment, unspecified eye: Secondary | ICD-10-CM | POA: Diagnosis not present

## 2020-06-02 DIAGNOSIS — I872 Venous insufficiency (chronic) (peripheral): Secondary | ICD-10-CM | POA: Diagnosis not present

## 2020-06-02 DIAGNOSIS — K219 Gastro-esophageal reflux disease without esophagitis: Secondary | ICD-10-CM | POA: Diagnosis not present

## 2020-06-11 DIAGNOSIS — H33339 Multiple defects of retina without detachment, unspecified eye: Secondary | ICD-10-CM | POA: Diagnosis not present

## 2020-06-11 DIAGNOSIS — H43819 Vitreous degeneration, unspecified eye: Secondary | ICD-10-CM | POA: Diagnosis not present

## 2020-06-11 DIAGNOSIS — I129 Hypertensive chronic kidney disease with stage 1 through stage 4 chronic kidney disease, or unspecified chronic kidney disease: Secondary | ICD-10-CM | POA: Diagnosis not present

## 2020-06-11 DIAGNOSIS — K253 Acute gastric ulcer without hemorrhage or perforation: Secondary | ICD-10-CM | POA: Diagnosis not present

## 2020-06-11 DIAGNOSIS — N183 Chronic kidney disease, stage 3 unspecified: Secondary | ICD-10-CM | POA: Diagnosis not present

## 2020-06-11 DIAGNOSIS — K219 Gastro-esophageal reflux disease without esophagitis: Secondary | ICD-10-CM | POA: Diagnosis not present

## 2020-06-11 DIAGNOSIS — R7303 Prediabetes: Secondary | ICD-10-CM | POA: Diagnosis not present

## 2020-06-11 DIAGNOSIS — I872 Venous insufficiency (chronic) (peripheral): Secondary | ICD-10-CM | POA: Diagnosis not present

## 2020-06-11 DIAGNOSIS — K589 Irritable bowel syndrome without diarrhea: Secondary | ICD-10-CM | POA: Diagnosis not present

## 2020-06-15 ENCOUNTER — Telehealth: Payer: Self-pay | Admitting: Internal Medicine

## 2020-06-15 NOTE — Telephone Encounter (Signed)
Hey Dr Marina Goodell, this pt cancelled her procedure EGD scheduled for tomorrow 10/5 due to change of mind, pt is willing to pay the cancellation fee.

## 2020-06-15 NOTE — Telephone Encounter (Signed)
Susan Fernandez, This patient really needs follow-up endoscopy to assess gastric ulcer healing (rule out malignancy).  Please reach out and encouraged her to schedule a follow-up EGD.  Has a spot for tomorrow, but canceled per the scheduler.  Thanks Dr. Marina Goodell

## 2020-06-15 NOTE — Telephone Encounter (Signed)
Attempted to reach pt by phone, unable to reach her. Phone rang and rang, no answer. Will try again.

## 2020-06-16 ENCOUNTER — Encounter: Payer: Medicare PPO | Admitting: Internal Medicine

## 2020-06-18 NOTE — Telephone Encounter (Signed)
Attempted to call pt again and still get busy signal, no answer. Will try again tomorrow, if cannot reach her will send a letter.

## 2020-06-22 NOTE — Telephone Encounter (Signed)
Spoke with pt and let her know she needed to have repeat EGD. Pt states she in nervous about being sedated as she had 2 procedures back to back. She also states she does not have transportation or someone to stay with her after the procedure. Also reports she is on a fixed income and cannot afford another procedure. Pt would like to know if there is a different test she could have to see if the ulcer is healing. Please advise.

## 2020-06-22 NOTE — Telephone Encounter (Signed)
Spoke with pt and she is aware.

## 2020-06-22 NOTE — Telephone Encounter (Signed)
Unfortunately, there is no other test.  I encouraged her to have her upper endoscopy at her nearest convenience.  Thanks

## 2020-08-19 ENCOUNTER — Other Ambulatory Visit: Payer: Self-pay | Admitting: Internal Medicine

## 2020-08-19 DIAGNOSIS — E782 Mixed hyperlipidemia: Secondary | ICD-10-CM

## 2021-03-30 IMAGING — CT CT ANGIO HEAD
1 of 11 series · 14 of 47 positions shown · IV contrast (omnipaque)
Comparison: CT head 11/03/2019.  MRI head 11/03/2019

CLINICAL DATA: Stroke

EXAM:
CT ANGIOGRAPHY HEAD AND NECK
TECHNIQUE: Multidetector CT imaging of the head and neck was performed using
the standard protocol during bolus administration of intravenous
contrast. Multiplanar CT image reconstructions and MIPs were
obtained to evaluate the vascular anatomy. Carotid stenosis
measurements (when applicable) are obtained utilizing NASCET
criteria, using the distal internal carotid diameter as the
denominator.
CONTRAST:  100mL OMNIPAQUE IOHEXOL 350 MG/ML SOLN

[Series 16: thin · axial · 0.40mm/px · z∈[-358,-42]mm · 14 of 730 slices shown]
[im 49/730  brain]
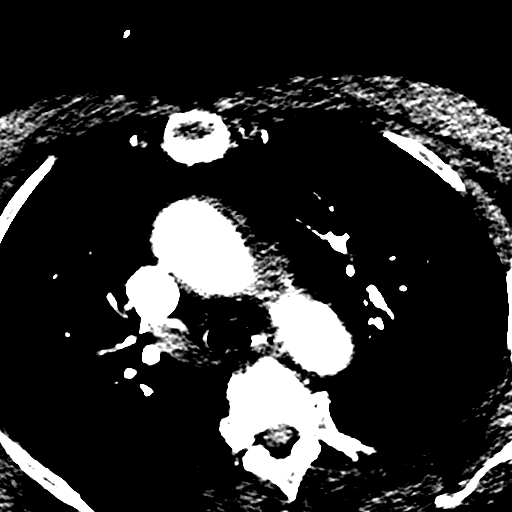
[im 98/730  bone]
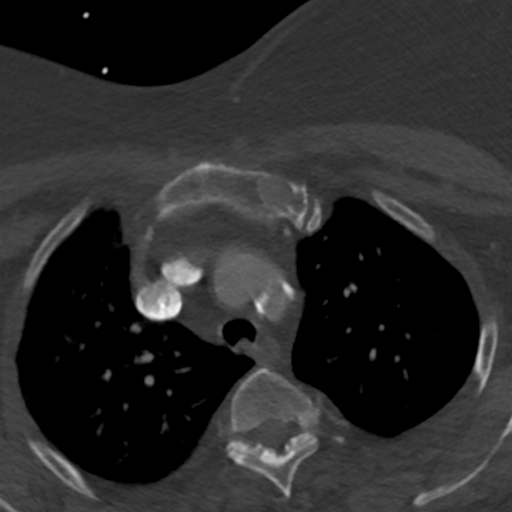
[im 146/730  brain]
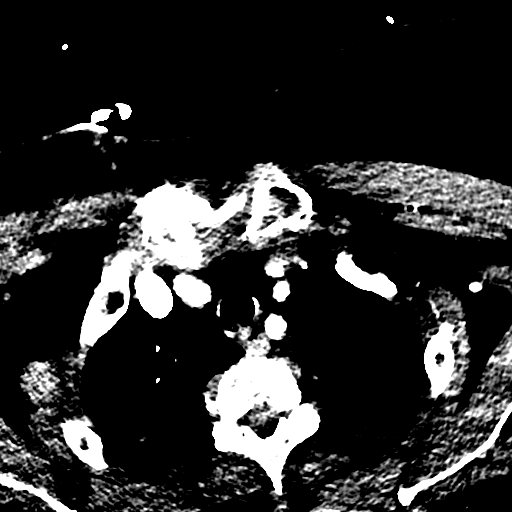
[im 195/730  bone]
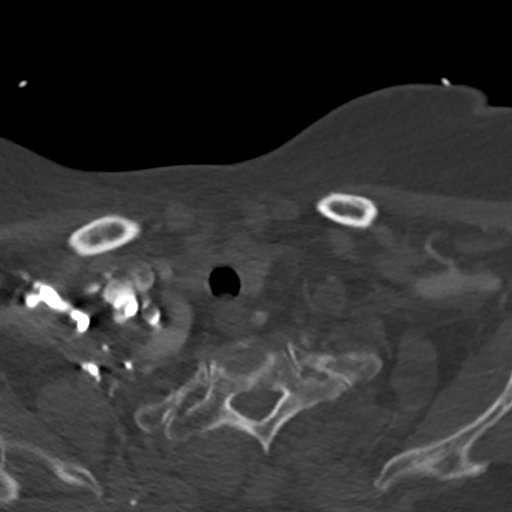
[im 244/730  brain]
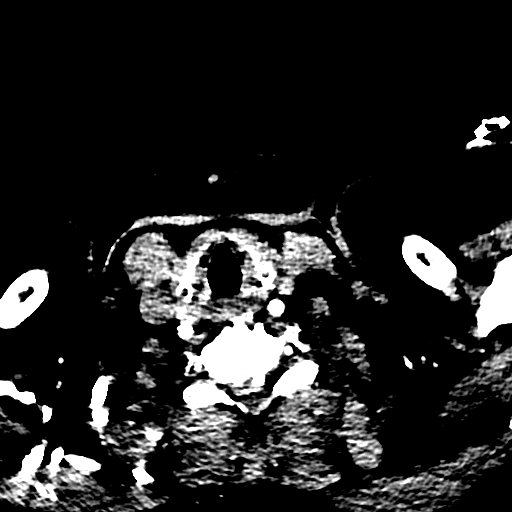
[im 292/730  bone]
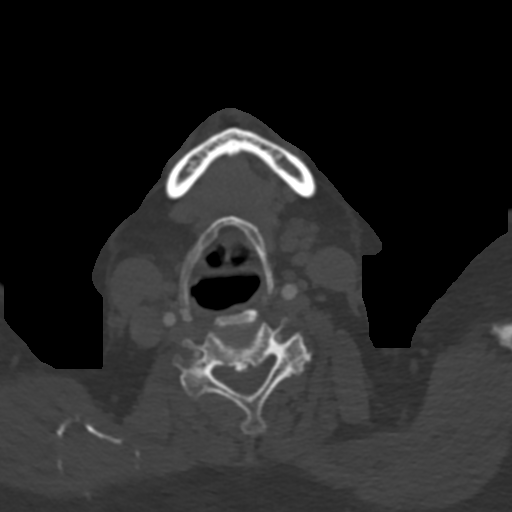
[im 341/730  brain]
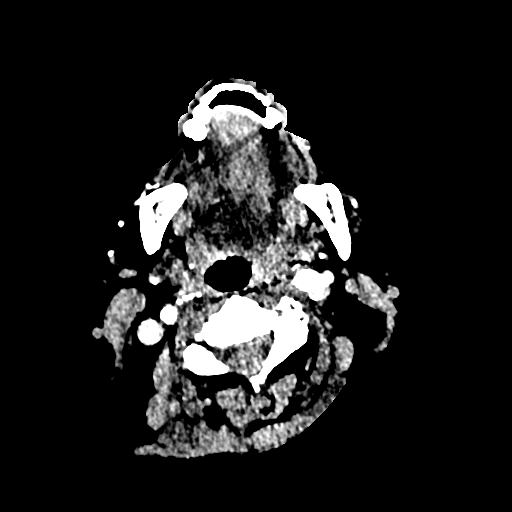
[im 389/730  bone]
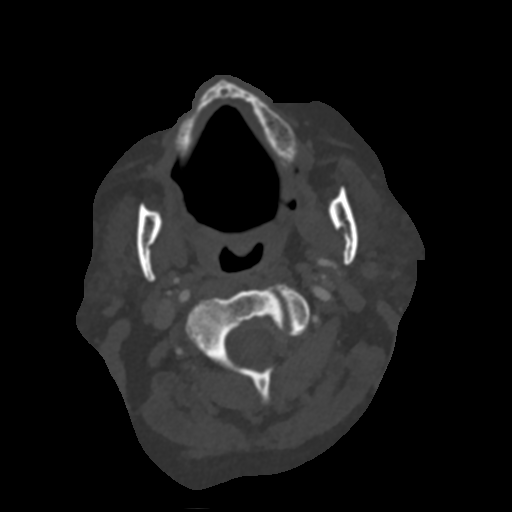
[im 438/730  brain]
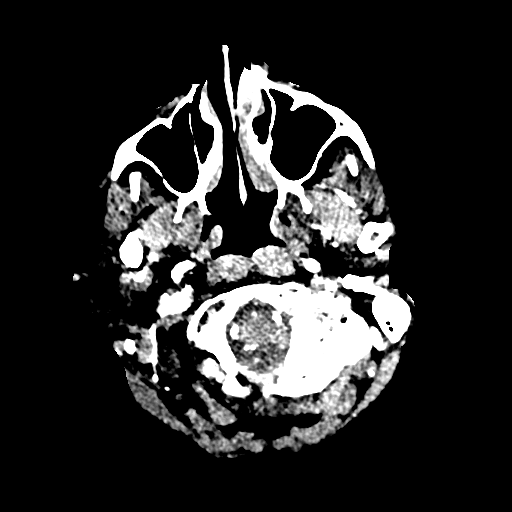
[im 487/730  bone]
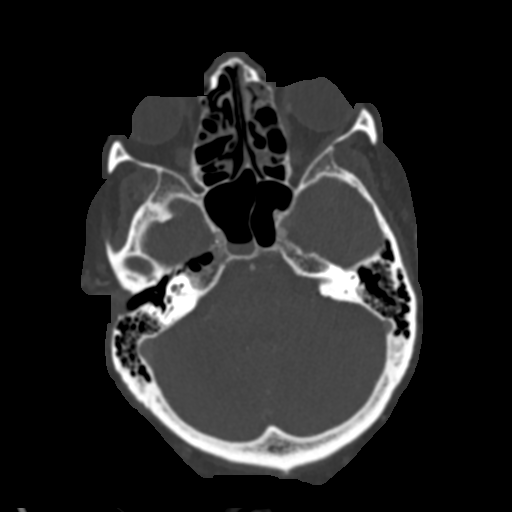
[im 535/730  brain]
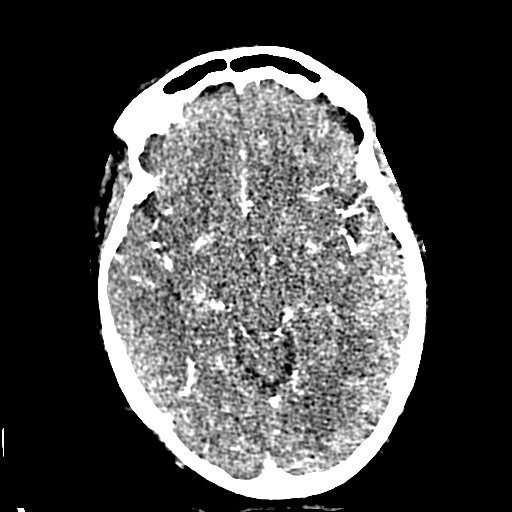
[im 584/730  bone]
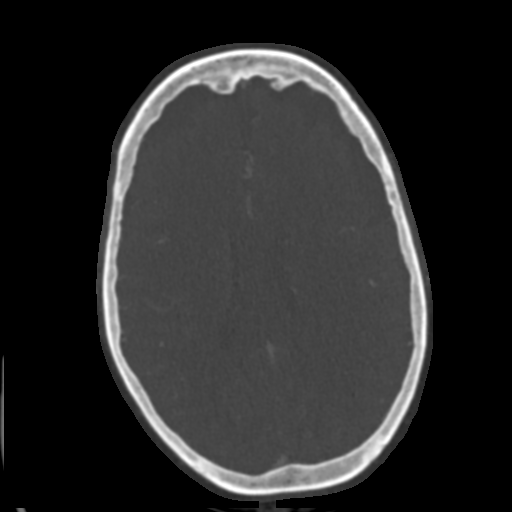
[im 632/730  brain]
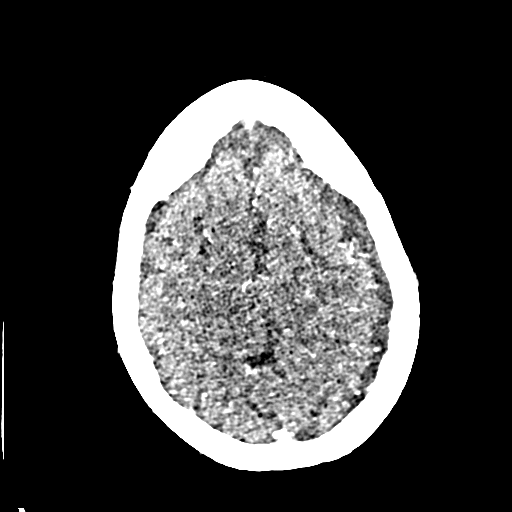
[im 681/730  bone]
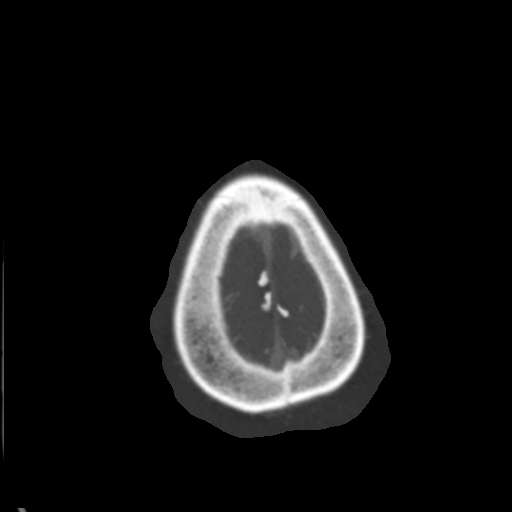

[14 of 47 positions shown; findings below may reference images not displayed]

FINDINGS: CT HEAD FINDINGS

Brain: Interval development of hypodensity right medial thalamus
compatible with acute infarct as noted on MRI. No associated
hemorrhage. No other infarct. No hemorrhage or mass. Ventricle size
normal. Small subdural hygroma left frontal lobe unchanged.

Vascular: Negative for hyperdense vessel

Skull: Negative

Sinuses: Air-fluid level sphenoid sinus. Mild mucosal edema
remaining paranasal sinuses.

Orbits: Bilateral cataract surgery.  No mass lesion.

Review of the MIP images confirms the above findings

CTA NECK FINDINGS

Aortic arch: Standard branching. Imaged portion shows no evidence of
aneurysm or dissection. No significant stenosis of the major arch
vessel origins. Atherosclerotic calcification aortic arch.

Right carotid system: Mild atherosclerotic disease right carotid
bifurcation without stenosis

Left carotid system: Mild atherosclerotic disease left carotid
bifurcation without stenosis.

Vertebral arteries: Small vertebral arteries are patent to the
basilar without stenosis.

Skeleton: Degenerative changes cervical spine. No acute skeletal
abnormality.

Other neck: Negative for mass or adenopathy

Upper chest: Lung apices clear bilaterally.

Review of the MIP images confirms the above findings

CTA HEAD FINDINGS

Anterior circulation: Atherosclerotic calcification in the cavernous
carotid bilaterally without significant stenosis. Anterior and
middle cerebral arteries patent bilaterally without stenosis.

Posterior circulation: Both vertebral arteries patent to the
basilar. PICA patent bilaterally. Basilar widely patent. Superior
cerebellar and posterior cerebral arteries patent bilaterally. Fetal
origin of the posterior cerebral artery bilaterally.

Venous sinuses: Negative

Anatomic variants: None

Review of the MIP images confirms the above findings
IMPRESSION: 1. Acute infarct right thalamus.  Negative for hemorrhage.
2. No significant carotid or vertebral artery stenosis in the neck.
Mild atherosclerotic disease in the carotid bifurcation bilaterally
3. Negative for intracranial large vessel occlusion.

## 2021-05-28 DIAGNOSIS — Z8673 Personal history of transient ischemic attack (TIA), and cerebral infarction without residual deficits: Secondary | ICD-10-CM | POA: Diagnosis not present

## 2021-05-28 DIAGNOSIS — Z7722 Contact with and (suspected) exposure to environmental tobacco smoke (acute) (chronic): Secondary | ICD-10-CM | POA: Diagnosis not present

## 2021-05-28 DIAGNOSIS — Z6833 Body mass index (BMI) 33.0-33.9, adult: Secondary | ICD-10-CM | POA: Diagnosis not present

## 2021-05-28 DIAGNOSIS — R030 Elevated blood-pressure reading, without diagnosis of hypertension: Secondary | ICD-10-CM | POA: Diagnosis not present

## 2021-05-28 DIAGNOSIS — E669 Obesity, unspecified: Secondary | ICD-10-CM | POA: Diagnosis not present

## 2021-06-07 ENCOUNTER — Encounter: Payer: Medicare PPO | Admitting: Internal Medicine

## 2021-08-29 IMAGING — DX DG CHEST 1V PORT
1 series · 1 of 1 positions shown · non-contrast
Comparison: Portable exam 2434 hours compared to 03/14/2012

CLINICAL DATA: Leukocytosis, weakness, nausea, vomiting

EXAM:
PORTABLE CHEST 1 VIEW

[chest ap]
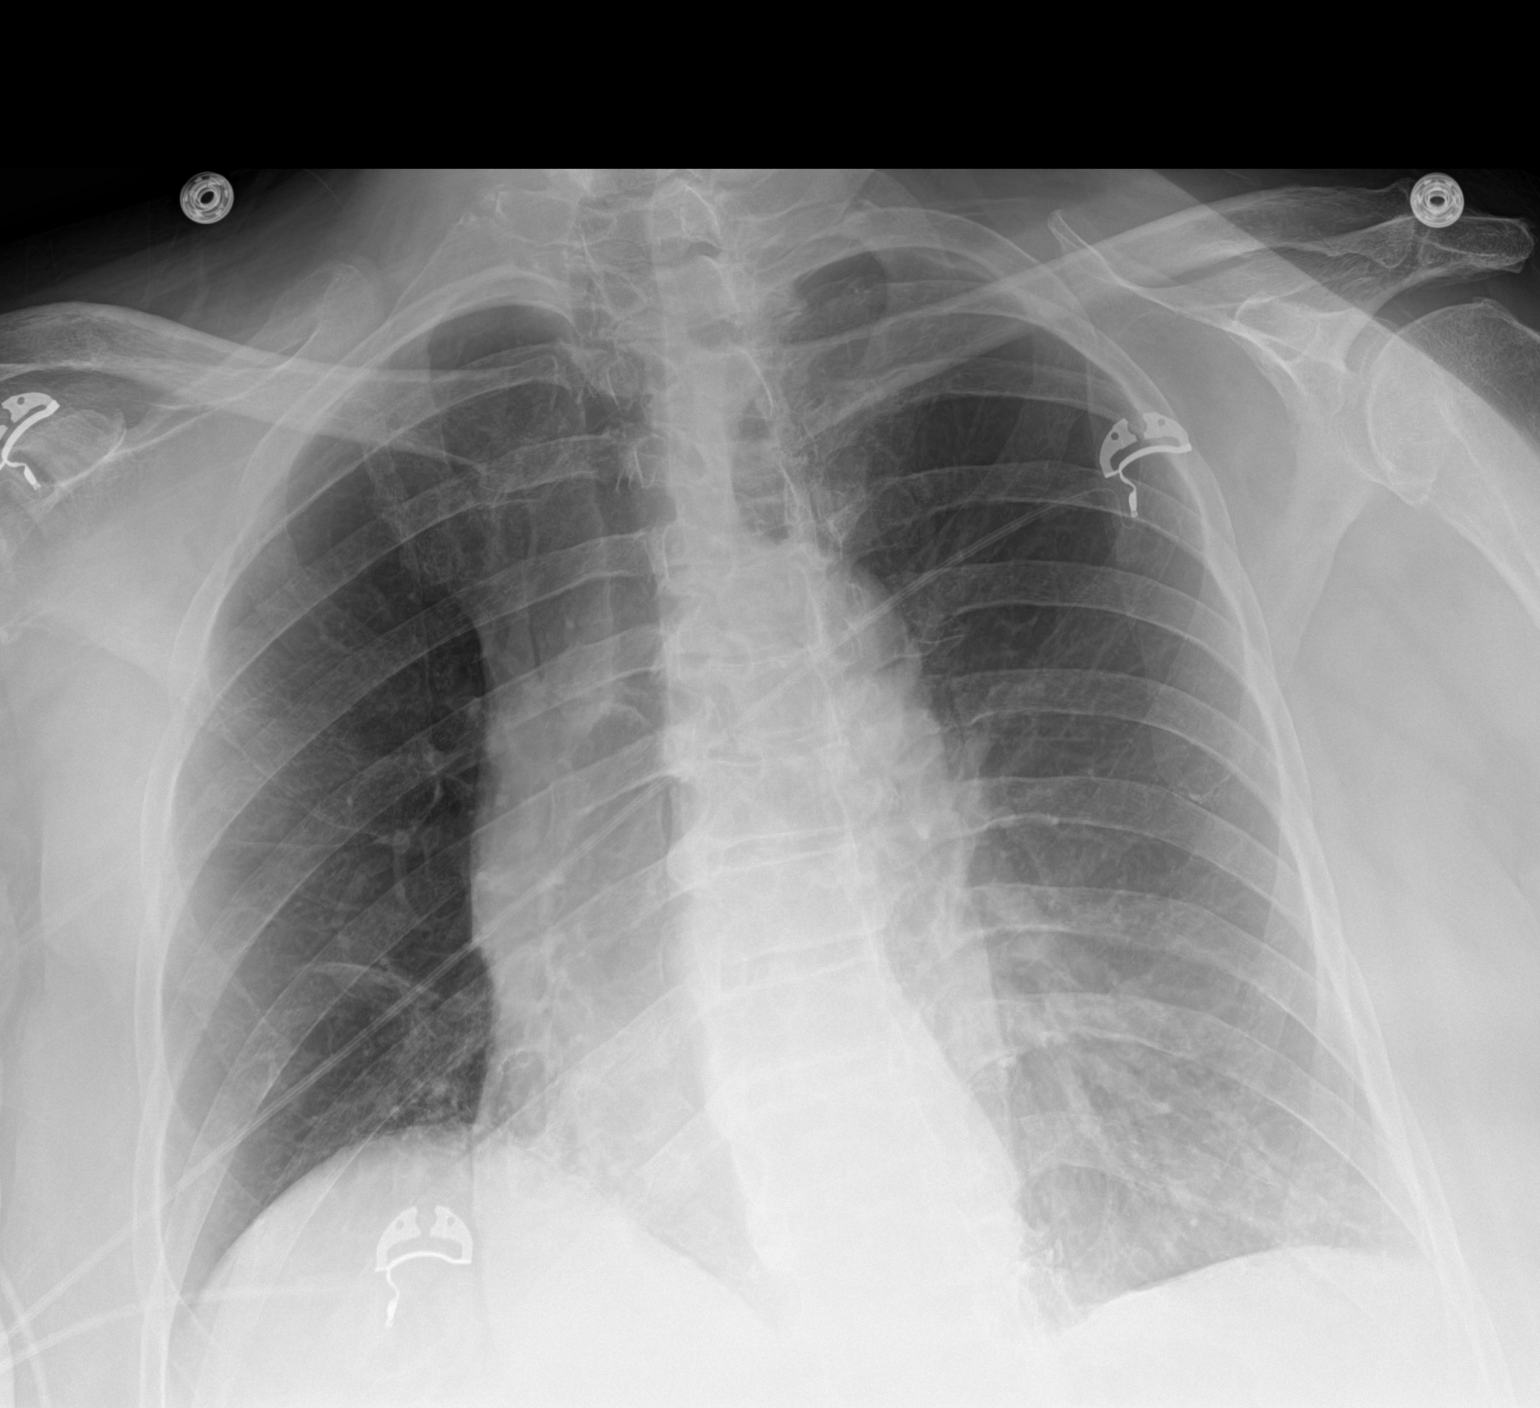

[1 of 1 positions shown; findings below may reference images not displayed]

FINDINGS: Normal heart size, mediastinal contours, and pulmonary vascularity.

Rotated to the RIGHT.

Mild RIGHT basilar atelectasis.

Lungs otherwise clear.

No infiltrate, pleural effusion or pneumothorax.
IMPRESSION: Mild RIGHT basilar atelectasis.

## 2021-09-20 DIAGNOSIS — Z8673 Personal history of transient ischemic attack (TIA), and cerebral infarction without residual deficits: Secondary | ICD-10-CM | POA: Diagnosis not present

## 2021-09-20 DIAGNOSIS — Z833 Family history of diabetes mellitus: Secondary | ICD-10-CM | POA: Diagnosis not present

## 2021-09-20 DIAGNOSIS — Z8249 Family history of ischemic heart disease and other diseases of the circulatory system: Secondary | ICD-10-CM | POA: Diagnosis not present

## 2021-09-20 DIAGNOSIS — Z825 Family history of asthma and other chronic lower respiratory diseases: Secondary | ICD-10-CM | POA: Diagnosis not present

## 2021-09-20 DIAGNOSIS — R030 Elevated blood-pressure reading, without diagnosis of hypertension: Secondary | ICD-10-CM | POA: Diagnosis not present

## 2021-09-20 DIAGNOSIS — E669 Obesity, unspecified: Secondary | ICD-10-CM | POA: Diagnosis not present

## 2021-09-20 DIAGNOSIS — Z809 Family history of malignant neoplasm, unspecified: Secondary | ICD-10-CM | POA: Diagnosis not present

## 2021-12-06 DIAGNOSIS — H31093 Other chorioretinal scars, bilateral: Secondary | ICD-10-CM | POA: Diagnosis not present

## 2021-12-06 DIAGNOSIS — H532 Diplopia: Secondary | ICD-10-CM | POA: Diagnosis not present

## 2021-12-06 DIAGNOSIS — H43812 Vitreous degeneration, left eye: Secondary | ICD-10-CM | POA: Diagnosis not present

## 2021-12-06 DIAGNOSIS — Z961 Presence of intraocular lens: Secondary | ICD-10-CM | POA: Diagnosis not present

## 2021-12-06 DIAGNOSIS — H26492 Other secondary cataract, left eye: Secondary | ICD-10-CM | POA: Diagnosis not present

## 2022-02-15 NOTE — Progress Notes (Unsigned)
Future Appointments  Date Time Provider Department  02/16/2022 11:30 AM Lucky CowboyMcKeown, Cyrena Kuchenbecker, MD [facility]  06/07/2022                CPE 11:00 AM Lucky CowboyMcKeown, Kahil Agner, MD [facility]    History of Present Illness:       This very nice 76 y.o.  poorly compliant DBF presents for belated follow-up  (last seen 11/19/2019) with HTN, HLD, Pre-Diabetes and Vitamin D Deficiency.  She was hospitalized in July 2021 for 1 week with a GI Bleed (Gastric Ulcer) & was discharged to a NH. Previous CT scans in hospital have shown Aortic Atherosclerosis.       Patient has been treated  for HTN  - but poorly compliant  & BP has been controlled at home. Today's  . Patient has had no complaints of any cardiac type chest pain, palpitations, dyspnea / orthopnea / PND, dizziness, claudication, or dependent edema.       Hyperlipidemia is not controlled with diet &  she has not been compliant with prescribed meds. Patient denies myalgias or other med SE's. Last Lipids were not at goal :  Lab Results  Component Value Date   CHOL 292 (H) 11/03/2019   HDL 40 (L) 11/03/2019   LDLCALC 230 (H) 11/03/2019   TRIG 111 11/03/2019   CHOLHDL 7.3 11/03/2019     Also, the patient has history of PreDiabetes  (A1c 6.0% /2016) and has had no symptoms of reactive hypoglycemia, diabetic polys, paresthesias or visual blurring.  Last A1c was at goal :  Lab Results  Component Value Date   HGBA1C 5.5 04/14/2020                                                         Further, the patient also has history of Vitamin D Deficiency ("36" / 2016)  and supplements vitamin D . Last vitamin D was not at goal :  Lab Results  Component Value Date   VD25OH 52 06/21/2017      Current Outpatient Medications on File Prior to Visit  Medication Sig   atorvastatin (LIPITOR) 80 MG tablet Take     1 tablet     Daily        for Cholesterol   pantoprazole (PROTONIX) 40 MG tablet Take 1 tablet (40 mg total) by mouth 2 (two) times daily.    sucralfate (CARAFATE) 1 GM/10ML suspension Take 1 g by mouth 4 (four) times daily -  with meals and at bedtime.   No current facility-administered medications on file prior to visit.     Allergies  Allergen Reactions   Ppd [Tuberculin Purified Protein Derivative]    Sulfa Antibiotics Other (See Comments)    Passed out   Tolectin [Tolmetin]      PMHx:   Past Medical History:  Diagnosis Date   Cataract    Complication of anesthesia    Difficult to wake   GERD (gastroesophageal reflux disease)    Hyperlipidemia    Hypertension 1995   IBS (irritable bowel syndrome)    Morbid obesity (HCC)    Multiple defects of retina 10/29/2015   Posterior vitreous detachment 10/29/2015   Prediabetes 2016   Right thalamic infarction (HCC) 11/03/2019   Lacunar R thalamic infarction without hemorrhage or shift per MRI 11/03/2019  Venous insufficiency      Immunization History  Administered Date(s) Administered   PFIZER(Purple Top)SARS-COV-2 Vaccination 01/03/2020, 01/24/2020     Past Surgical History:  Procedure Laterality Date   CATARACT EXTRACTION, BILATERAL Bilateral 2018   L- 01/2017, R-02/2017   ESOPHAGOGASTRODUODENOSCOPY (EGD) WITH PROPOFOL N/A 04/05/2020   Procedure: ESOPHAGOGASTRODUODENOSCOPY (EGD) WITH PROPOFOL;  Surgeon: Hilarie Fredrickson, MD;  Location: [facility] ENDOSCOPY;  Service: Endoscopy;  Laterality: N/A;   ESOPHAGOGASTRODUODENOSCOPY (EGD) WITH PROPOFOL N/A 04/08/2020   Procedure: ESOPHAGOGASTRODUODENOSCOPY (EGD) WITH PROPOFOL;  Surgeon: Shellia Cleverly, DO;  Location: [facility] ENDOSCOPY;  Service: Gastroenterology;  Laterality: N/A;   HEMOSTASIS CLIP PLACEMENT  04/05/2020   Procedure: HEMOSTASIS CLIP PLACEMENT;  Surgeon: Hilarie Fredrickson, MD;  Location: [facility] ENDOSCOPY;  Service: Endoscopy;;   HEMOSTASIS CONTROL  04/05/2020   Procedure: HEMOSTASIS CONTROL;  Surgeon: Hilarie Fredrickson, MD;  Location: [facility] ENDOSCOPY;  Service: Endoscopy;;   HOT HEMOSTASIS N/A 04/05/2020   Procedure: HOT HEMOSTASIS  (ARGON PLASMA COAGULATION/BICAP);  Surgeon: Hilarie Fredrickson, MD;  Location[facility] ENDOSCOPY;  Service: Endoscopy;  Laterality: N/A;   RETINAL TEAR REPAIR CRYOTHERAPY Bilateral 2000   Dr. Cecilie Kicks   SUBMUCOSAL INJECTION  04/05/2020   Procedure: SUBMUCOSAL INJECTION;  Surgeon: Hilarie Fredrickson, MD;  Location: [facility] ENDOSCOPY;  Service: Endoscopy;;    FHx:    Reviewed / unchanged  SHx:    Reviewed / unchanged   Systems Review:  Constitutional: Denies fever, chills, wt changes, headaches, insomnia, fatigue, night sweats, change in appetite. Eyes: Denies redness, blurred vision, diplopia, discharge, itchy, watery eyes.  ENT: Denies discharge, congestion, post nasal drip, epistaxis, sore throat, earache, hearing loss, dental pain, tinnitus, vertigo, sinus pain, snoring.  CV: Denies chest pain, palpitations, irregular heartbeat, syncope, dyspnea, diaphoresis, orthopnea, PND, claudication or edema. Respiratory: denies cough, dyspnea, DOE, pleurisy, hoarseness, laryngitis, wheezing.  Gastrointestinal: Denies dysphagia, odynophagia, heartburn, reflux, water brash, abdominal pain or cramps, nausea, vomiting, bloating, diarrhea, constipation, hematemesis, melena, hematochezia  or hemorrhoids. Genitourinary: Denies dysuria, frequency, urgency, nocturia, hesitancy, discharge, hematuria or flank pain. Musculoskeletal: Denies arthralgias, myalgias, stiffness, jt. swelling, pain, limping or strain/sprain.  Skin: Denies pruritus, rash, hives, warts, acne, eczema or change in skin lesion(s). Neuro: No weakness, tremor, incoordination, spasms, paresthesia or pain. Psychiatric: Denies confusion, memory loss or sensory loss. Endo: Denies change in weight, skin or hair change.  Heme/Lymph: No excessive bleeding, bruising or enlarged lymph nodes.  Physical Exam  There were no vitals taken for this visit.  Appears  well nourished, well groomed  and in no distress.  Eyes: PERRLA, EOMs, conjunctiva no swelling or  erythema. Sinuses: No frontal/maxillary tenderness ENT/Mouth: EAC's clear, TM's nl w/o erythema, bulging. Nares clear w/o erythema, swelling, exudates. Oropharynx clear without erythema or exudates. Oral hygiene is good. Tongue normal, non obstructing. Hearing intact.  Neck: Supple. Thyroid not palpable. Car 2+/2+ without bruits, nodes or JVD. Chest: Respirations nl with BS clear & equal w/o rales, rhonchi, wheezing or stridor.  Cor: Heart sounds normal w/ regular rate and rhythm without sig. murmurs, gallops, clicks or rubs. Peripheral pulses normal and equal  without edema.  Abdomen: Soft & bowel sounds normal. Non-tender w/o guarding, rebound, hernias, masses or organomegaly.  Lymphatics: Unremarkable.  Musculoskeletal: Full ROM all peripheral extremities, joint stability, 5/5 strength and normal gait.  Skin: Warm, dry without exposed rashes, lesions or ecchymosis apparent.  Neuro: Cranial nerves intact, reflexes equal bilaterally. Sensory-motor testing grossly intact. Tendon reflexes grossly intact.  Pysch: Alert & oriented x 3.  Insight  and judgement nl & appropriate. No ideations.  Assessment and Plan:  1. Essential hypertension  - Continue medication, monitor blood pressure at home.  - Continue DASH diet.  Reminder to go to the ER if any CP,  SOB, nausea, dizziness, severe HA, changes vision/speech.   - CBC with Differential/Platelet - COMPLETE METABOLIC PANEL WITH GFR - Magnesium - TSH  2. Hyperlipidemia, mixed  - Continue diet/meds, exercise,& lifestyle modifications.  - Continue monitor periodic cholesterol/liver & renal functions    - Lipid panel - TSH  3. Abnormal glucose  - Continue diet, exercise  - Lifestyle modifications.   - Monitor appropriate labs - Hemoglobin A1c - Insulin, random  4. Vitamin D deficiency  - Continue supplementation   - VITAMIN D 25 Hydroxy   5. Prediabetes  - Hemoglobin A1c - Insulin, random  6. Aortic atherosclerosis  (HCC)  - Lipid panel  7. Medication management  - CBC with Differential/Platelet - COMPLETE METABOLIC PANEL WITH GFR - Magnesium - Lipid panel - TSH - Hemoglobin A1c - Insulin, random - VITAMIN D 25 Hydroxy         Discussed  regular exercise, BP monitoring, weight control to achieve/maintain BMI less than 25 and discussed med and SE's. Recommended labs to assess /monitor clinical status .  I discussed the assessment and treatment plan with the patient. The patient was provided an opportunity to ask questions and all were answered. The patient agreed with the plan and demonstrated an understanding of the instructions.  I provided over 30 minutes of exam, counseling, chart review and  complex critical decision making.        The patient was advised to call back or seek an in-person evaluation if the symptoms worsen or if the condition fails to improve as anticipated.   Marinus Maw, MD

## 2022-02-15 NOTE — Patient Instructions (Signed)

## 2022-02-16 ENCOUNTER — Ambulatory Visit (INDEPENDENT_AMBULATORY_CARE_PROVIDER_SITE_OTHER): Payer: Self-pay | Admitting: Internal Medicine

## 2022-02-16 DIAGNOSIS — E782 Mixed hyperlipidemia: Secondary | ICD-10-CM

## 2022-02-16 DIAGNOSIS — I700 Atherosclerosis of aorta: Secondary | ICD-10-CM

## 2022-02-16 DIAGNOSIS — Z79899 Other long term (current) drug therapy: Secondary | ICD-10-CM

## 2022-02-16 DIAGNOSIS — R7303 Prediabetes: Secondary | ICD-10-CM

## 2022-02-16 DIAGNOSIS — I10 Essential (primary) hypertension: Secondary | ICD-10-CM

## 2022-02-16 DIAGNOSIS — E559 Vitamin D deficiency, unspecified: Secondary | ICD-10-CM

## 2022-02-16 DIAGNOSIS — Z91199 Patient's noncompliance with other medical treatment and regimen due to unspecified reason: Secondary | ICD-10-CM

## 2022-02-16 DIAGNOSIS — R7309 Other abnormal glucose: Secondary | ICD-10-CM

## 2022-03-01 ENCOUNTER — Telehealth: Payer: Self-pay | Admitting: Internal Medicine

## 2022-03-01 NOTE — Telephone Encounter (Signed)
FOR DOCUMENTATION ONLY: This woman was a patient of ours up until 2 weeks ago, she had not been seen in our office in over 2 years and was wanting a handicap placard renewed on 02/15/22. I told her that due to having not been seen in our office in so long, she would need to have an appointment before being able to have that filled out (which I also verified with Dr. Oneta Rack beforehand). She was upset and using profanity, I calmly and politely asked her if she "needed a minute" in an attempt to de-escalate the call. After she was agreeable to making an appointment, one was made for the very next day with Dr. Oneta Rack. She called that morning (026/07/23) to cancel as documented on Epic, spoke to [place] and said she did not want to come. After Wille Celeste spoke with Dr. Oneta Rack he said that she had two options: reschedule and remain a pt in the practice or her chart would be closed and a same day cancellation fee would be applied according to our office policy for failure to keep scheduled appointments. According to [facility] her response to that was "I just wont drive then cause I am not coming in". A fee was applied and a dismissal letter was sent. She received said letter today and called very angry, would not let me get a word in. She said we were crooked, liars, and that she never made an appointment. I calmly asked her to take a minute and allow me to explain the reasons why she was dismissed which she was well aware of. She said I was crooked, a liar and that she was intent on getting a lawyer involved, again was using profanity and proceeded to hang the phone up. Dr. Oneta Rack has been made aware of the situation.

## 2022-05-05 ENCOUNTER — Other Ambulatory Visit: Payer: Self-pay

## 2022-05-05 NOTE — Patient Outreach (Signed)
[facility]) Care Management  05/05/2022  Breasia Karges [birth year] [id number]   Telephone call to patient for nurse call.  No answer. Unable to leave a message.  Plan: RN CM will attempt again within 4 business days and send letter.   Bary Leriche, RN, MSN [facility] Care Management Care Management Coordinator Direct Line [phone] Toll Free: [phone]  Fax: [phone]

## 2022-05-06 ENCOUNTER — Other Ambulatory Visit: Payer: Self-pay

## 2022-05-06 NOTE — Patient Outreach (Signed)
[facility]) Care Management  05/06/2022  Susan Fernandez 09-Sep-1946 [id number]   Telephone call to patient for nurse call. Patient states she is doing ok and that she was in the hospital earlier in the year. She acknowledges that she is not with Dr. Kathryne Sharper office any longer and that she has no PCP.  Discussed help getting  patient with new PCP. Patient declined and states she will find one.  Patient ended call.    Plan: RN CM will close case.  Bary Leriche, RN, MSN [facility] Care Management Care Management Coordinator Direct Line [phone] Toll Free: [phone]  Fax: [phone]

## 2022-06-07 ENCOUNTER — Encounter: Payer: Medicare PPO | Admitting: Internal Medicine

## 2023-02-15 DIAGNOSIS — Z825 Family history of asthma and other chronic lower respiratory diseases: Secondary | ICD-10-CM | POA: Diagnosis not present

## 2023-02-15 DIAGNOSIS — Z882 Allergy status to sulfonamides status: Secondary | ICD-10-CM | POA: Diagnosis not present

## 2023-02-15 DIAGNOSIS — Z6836 Body mass index (BMI) 36.0-36.9, adult: Secondary | ICD-10-CM | POA: Diagnosis not present

## 2023-02-15 DIAGNOSIS — Z809 Family history of malignant neoplasm, unspecified: Secondary | ICD-10-CM | POA: Diagnosis not present

## 2023-02-15 DIAGNOSIS — Z8249 Family history of ischemic heart disease and other diseases of the circulatory system: Secondary | ICD-10-CM | POA: Diagnosis not present

## 2023-02-15 DIAGNOSIS — Z9181 History of falling: Secondary | ICD-10-CM | POA: Diagnosis not present

## 2023-02-15 DIAGNOSIS — R030 Elevated blood-pressure reading, without diagnosis of hypertension: Secondary | ICD-10-CM | POA: Diagnosis not present

## 2023-02-15 DIAGNOSIS — Z8673 Personal history of transient ischemic attack (TIA), and cerebral infarction without residual deficits: Secondary | ICD-10-CM | POA: Diagnosis not present

## 2023-04-07 DIAGNOSIS — H26491 Other secondary cataract, right eye: Secondary | ICD-10-CM | POA: Diagnosis not present

## 2023-04-07 DIAGNOSIS — H31093 Other chorioretinal scars, bilateral: Secondary | ICD-10-CM | POA: Diagnosis not present

## 2023-04-07 DIAGNOSIS — H1045 Other chronic allergic conjunctivitis: Secondary | ICD-10-CM | POA: Diagnosis not present

## 2023-04-07 DIAGNOSIS — H532 Diplopia: Secondary | ICD-10-CM | POA: Diagnosis not present

## 2023-04-07 DIAGNOSIS — H43812 Vitreous degeneration, left eye: Secondary | ICD-10-CM | POA: Diagnosis not present

## 2023-04-07 DIAGNOSIS — Z961 Presence of intraocular lens: Secondary | ICD-10-CM | POA: Diagnosis not present

## 2023-04-07 DIAGNOSIS — H04123 Dry eye syndrome of bilateral lacrimal glands: Secondary | ICD-10-CM | POA: Diagnosis not present
